# Patient Record
Sex: Male | Born: 1975
Health system: Southern US, Community
[De-identification: ages and names within clinical notes are randomized; demographics above are authoritative.]

## PROBLEM LIST (undated history)

## (undated) DIAGNOSIS — G459 Transient cerebral ischemic attack, unspecified: Secondary | ICD-10-CM

## (undated) DIAGNOSIS — I4891 Unspecified atrial fibrillation: Secondary | ICD-10-CM

## (undated) DIAGNOSIS — I10 Essential (primary) hypertension: Secondary | ICD-10-CM

## (undated) DIAGNOSIS — I509 Heart failure, unspecified: Secondary | ICD-10-CM

## (undated) HISTORY — DX: Heart failure, unspecified: I50.9

## (undated) HISTORY — DX: Transient cerebral ischemic attack, unspecified: G45.9

---

## 2007-07-29 ENCOUNTER — Emergency Department (HOSPITAL_COMMUNITY): Admission: EM | Admit: 2007-07-29 | Discharge: 2007-07-29 | Payer: Self-pay | Admitting: Emergency Medicine

## 2007-08-29 HISTORY — PX: CHOLECYSTECTOMY: SHX55

## 2007-09-17 ENCOUNTER — Emergency Department (HOSPITAL_COMMUNITY): Admission: EM | Admit: 2007-09-17 | Discharge: 2007-09-17 | Payer: Self-pay | Admitting: Emergency Medicine

## 2008-03-06 ENCOUNTER — Inpatient Hospital Stay (HOSPITAL_COMMUNITY): Admission: EM | Admit: 2008-03-06 | Discharge: 2008-03-07 | Payer: Self-pay | Admitting: Emergency Medicine

## 2008-03-06 ENCOUNTER — Encounter (INDEPENDENT_AMBULATORY_CARE_PROVIDER_SITE_OTHER): Payer: Self-pay | Admitting: General Surgery

## 2009-01-21 IMAGING — US US ABDOMEN COMPLETE
1 series · 13 of 25 positions shown · non-contrast
Comparison: 09/17/2007.

CLINICAL DATA: Abdominal pain.  History of gallstones.

ABDOMEN ULTRASOUND
TECHNIQUE: Complete abdominal ultrasound examination was performed
including evaluation of the liver, gallbladder, bile ducts,
pancreas, kidneys, spleen, IVC, and abdominal aorta.

[Series 1: unknown · 0.37mm/px · 13 of 64 slices shown]
[im 1/64]
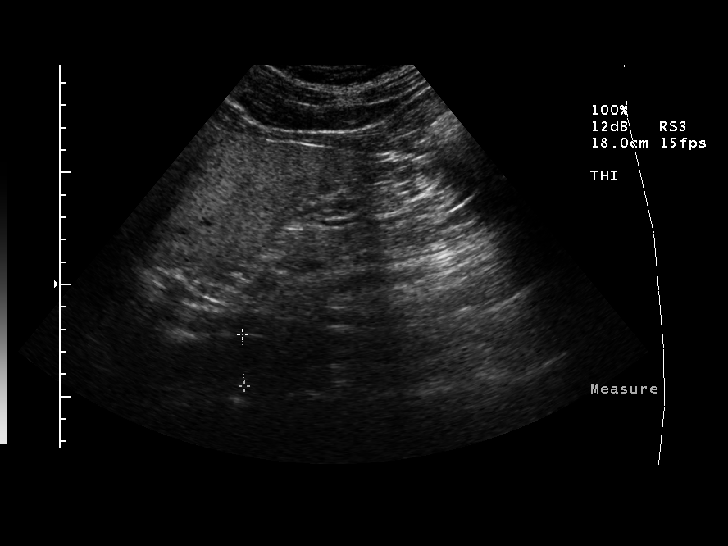
[im 6/64]
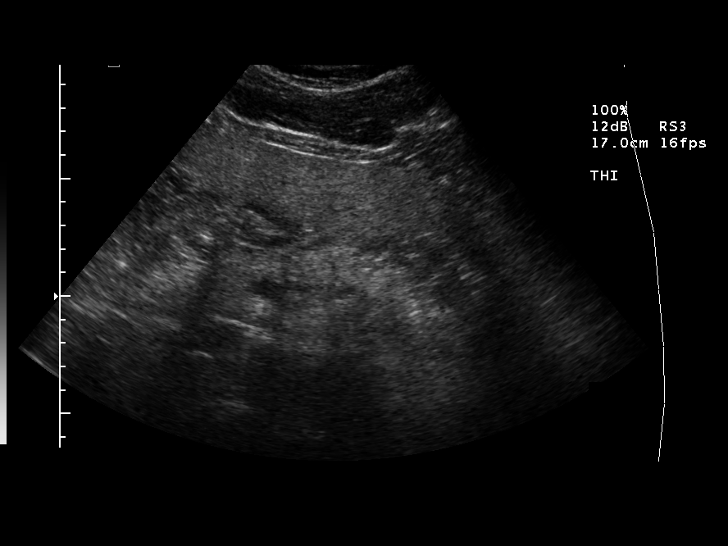
[im 11/64]
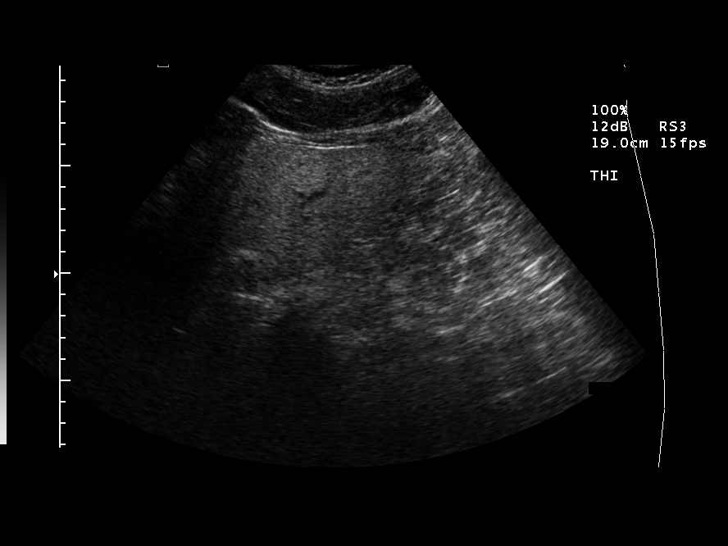
[im 16/64]
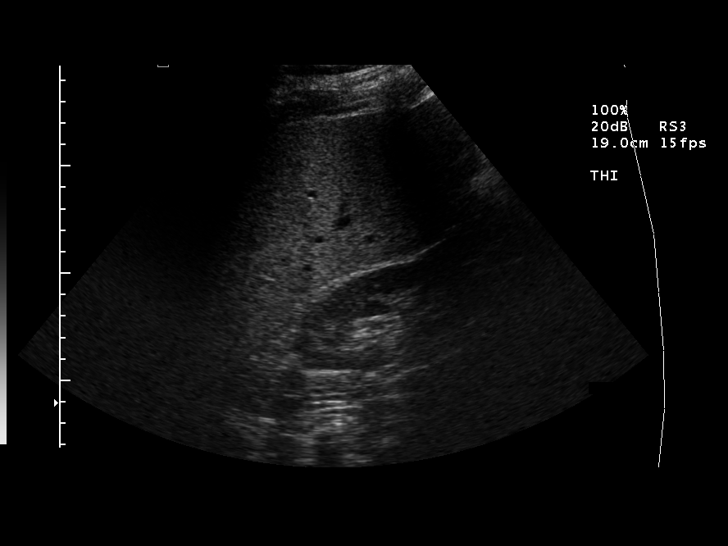
[im 22/64]
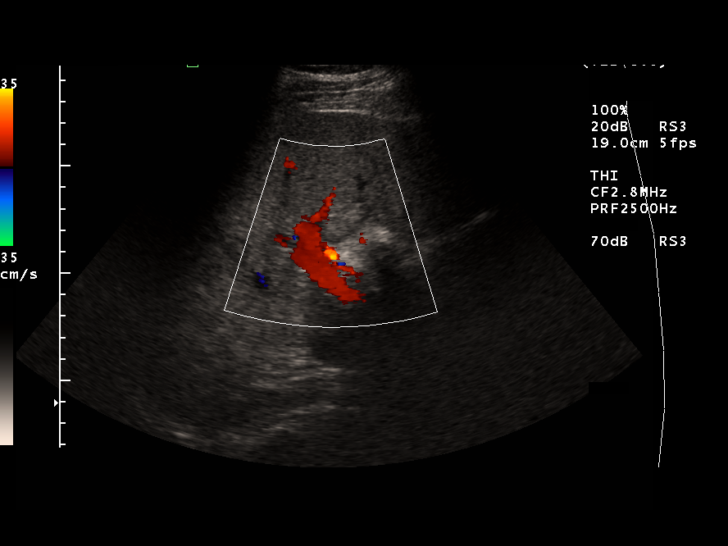
[im 27/64]
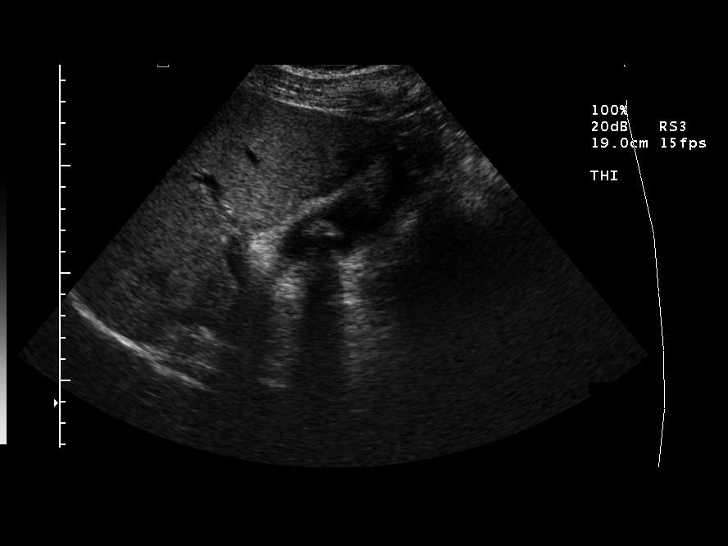
[im 32/64]
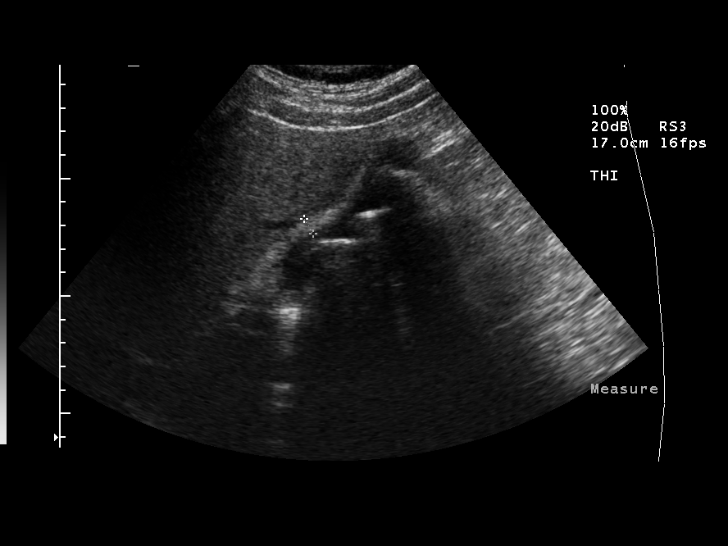
[im 37/64]
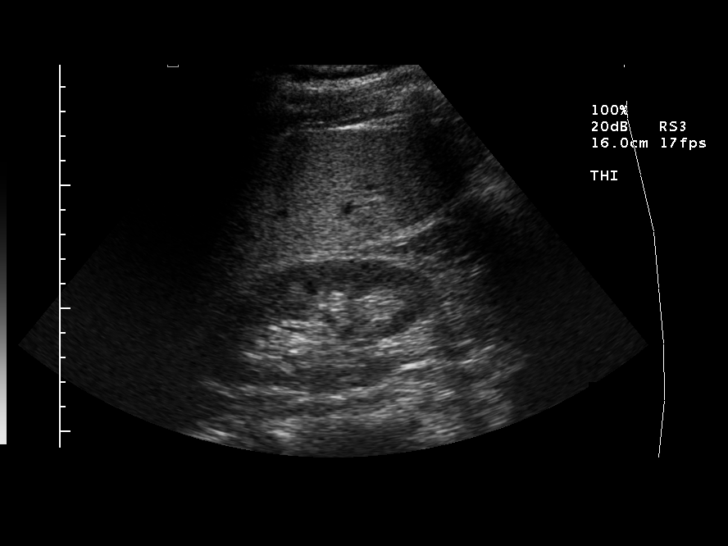
[im 43/64]
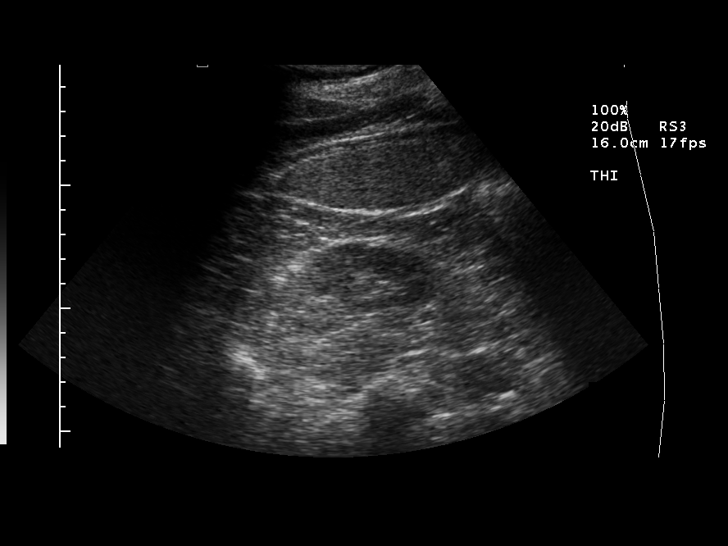
[im 48/64]
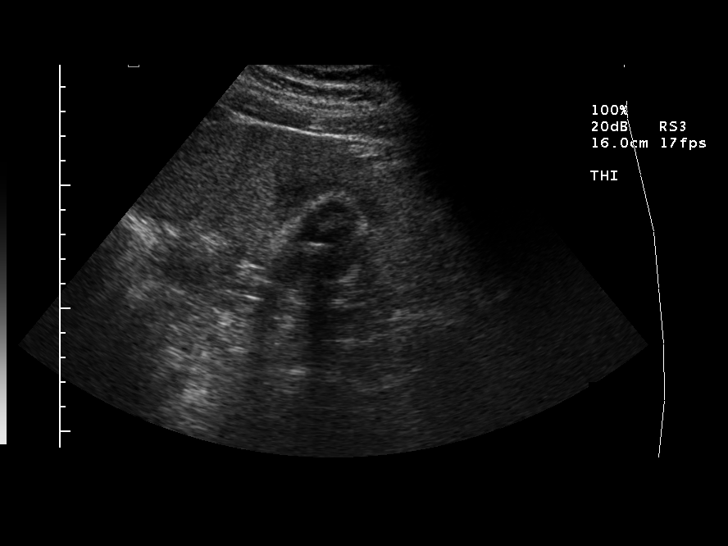
[im 53/64]
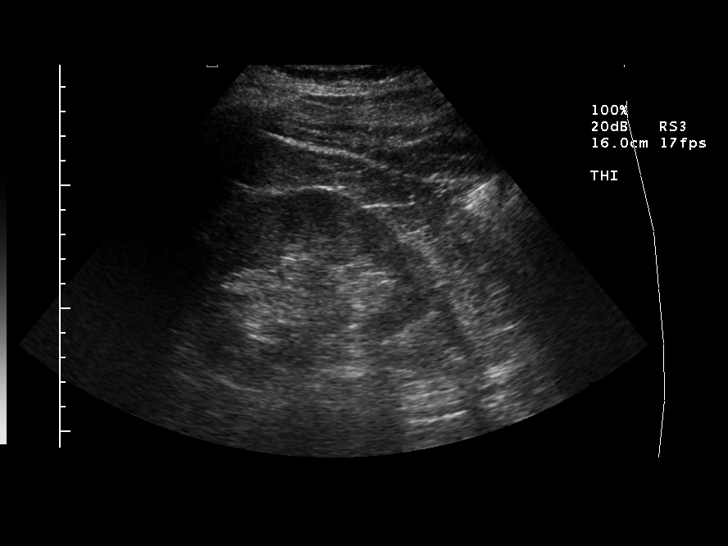
[im 58/64]
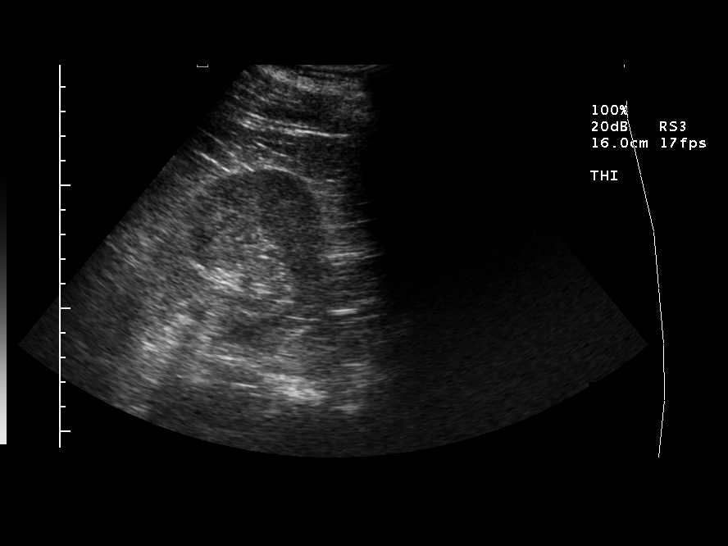
[im 64/64]
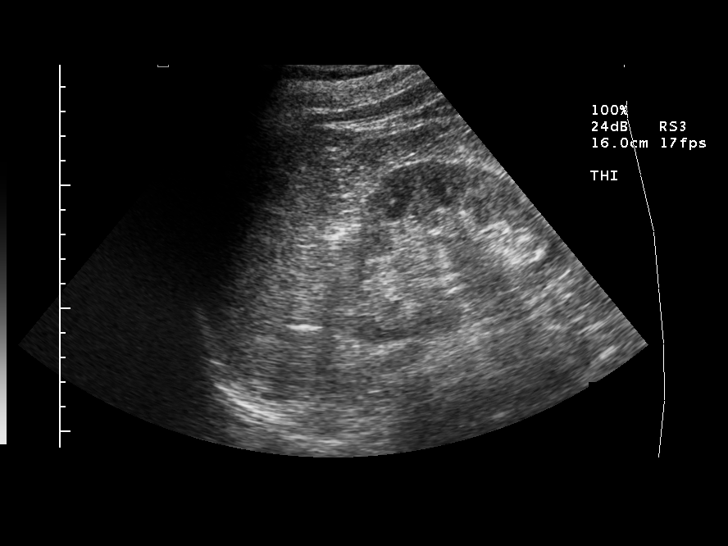

[13 of 25 positions shown; findings below may reference images not displayed]

FINDINGS: There is some loss of normal internal architecture of the
liver suggesting fatty infiltration.  No focal lesions are evident.
This is similar to the prior study.  Multiple gallstones are again
noted.  The gallbladder wall thickening is more apparent than on
the prior study.  Measures up to 7.4 mm.  There is no sonographic
Murphy's sign.  No free fluid is seen.  The common bile duct is at
the upper limits of normal measuring 6.1 mm.

The spleen is of normal size and echo texture measuring 10.9 cm.
The kidneys are unremarkable bilaterally.  There is no evidence for
stone or mass lesion.  There is no hydronephrosis.  The right
kidney measures 11.7 cm maximally.  The left kidney measures
cm maximally.  The aorta measures 2.3 cm maximally. The visualized
portions of the pancreas are unremarkable.
IMPRESSION: 1.  Cholelithiasis with mild gallbladder wall thickening.  Although
there is no sonographic Murphy's sign, this could represent
cholecystitis.  Clinical correlation and possibly a nuclear
medicine hepatic biliary scan may be use for further evaluation.
2.  Stable fatty infiltration of the liver.
3.  Borderline enlargement of the common bile duct at 6.1 mm.  This
is slightly increased.

## 2011-01-10 NOTE — Op Note (Signed)
NAMECAELLUM, MANCIL NO.:  [id number]   MEDICAL RECORD NO.:  [id number]          PATIENT TYPE:  INP   LOCATION:  5125                         FACILITY:  [facility]   PHYSICIAN:  Lennie Muckle, MD      DATE OF BIRTH:  Jul 27, 1976   DATE OF PROCEDURE:  03/06/2008  DATE OF DISCHARGE:                               OPERATIVE REPORT   PREOPERATIVE DIAGNOSES:  1. Cholecystitis.  2. Cholelithiasis.   POSTOPERATIVE DIAGNOSES:  1. Cholecystitis.  2. Cholelithiasis.   PROCEDURE:  Laparoscopic cholecystectomy with cholangiogram.   SURGEON:  Lennie Muckle, MD   ASSISTANT:  Letha Cape, PA   FINDINGS:  1. Thickened gallbladder wall consistent with cholecystitis.  2. Multiple stones within the gallbladder.   SPECIMEN:  Gallbladder to pathology.   ESTIMATED BLOOD LOSS:  50 mL.   COMPLICATIONS:  No immediate complications.   DRAINS:  No drains were placed.   INDICATIONS FOR PROCEDURE:  Mr. Stejskal is a 35 year old male who came  to the emergency department after having onset of pain approximately  3:00 a.m. He had previously been seen and was suppose to have an  elective cholecystectomy once he received insurance coverage.  However,  this acute onset was nonrelenting and thus he came to the emergency  department.   OPERATIVE FINDINGS:  The examination was consistent with acute  cholecystitis as well as cholelithiasis.   DETAILS OF PROCEDURE:  Mr. Hobbins was taken from the emergency  department to the preoperative holding suite.  He received 2 grams of  cefoxitin was taken to the operating room.  Once in the operating room,  he was placed in a supine position.  After administration of general  endotracheal anesthesia, his abdomen was prepped and draped in usual  sterile fashion.  A time-out procedure indicating the patient and  procedure were performed.  An incision was placed at the supraumbilical  area after anesthetizing the skin with 0.25% Marcaine.  A  Veress needle  was placed in the abdominal cavity to obtain pneumoperitoneum.  After  adequate pneumo-insufflation , I placed a #11 mm trocar with the OptiVu  inside the abdominal cavity.  All abdominal layers were visualized upon  entry.  The abdomen was inspected.  No evidence of injury upon placement  of trocar and Veress needle.  I then placed a 5 mm trocar at the  epigastric region and 1 in the right side of the abdomen and  visualization with camera.  A third 5-mm trocar was placed the right  side of the abdomen.  The gallbladder had omental adhesions.  The  infundibulum was difficult to grasp due to the thickened wall.  I did  aspirate some bilious fluid intake with grasping the fundus.  The fundus  was grasped up to the head of the patient.  I grasped the infundibulum  away from the liver bed.  Due to the very thickened wall, it was  somewhat difficult to dissect near the infundibulum.  Using dissection  with blunt as well as the [place] forceps, I was able to identify a  structure which  was deemed to be the cystic duct.  The cystic artery was  immediately posterior.  I continued carefully dissecting to ensure that  this was indeed the cystic duct.  I then placed a clip distally.  Transected across the cystic duct.  I was able to see the cholangiogram  catheter into the cystic duct for cholangiogram.  There was no evidence  of obstruction of the common duct and flow went easily into the  duodenum.  Right and left hepatic ducts were also visualized.  The  catheter was removed and I then placed 3 clips proximally on the cystic  duct.  Cystic artery was also clipped.  I ligated this distally with the  electrocautery.  I then continued my dissection on the peritoneum with  electrocautery.  This was again a difficult procedure due to very  thickened wall.  I did open the wall of the gallbladder and was able to  take it off successfully with electrocautery without spillage of stones.   The specimen was placed in EndoCatch bag and removed from the abdomen at  the umbilical region.  The abdomen was irrigated with 2 liters of  saline.  The liver bed was inspected.  The small amount of oozing which  was easily corrected with electrocautery.  There was no evidence of  bleeding upon final inspection of the liver bed and was inspected, no  evidence of bleeding.  I then closed the fascial defect at the umbilicus  with 0 Vicryl suture.  Final inspection again revealed no bleeding.  Pneumo-insufflation was released.  Trocars were removed.  Skin was  closed with 4-0 Monocryl.  The patient was then awoken and taken to Post  Anesthesia Care Unit stable condition.  She will be likely kept  overnight for pain control and discharged home in the morning.      Lennie Muckle, MD  Electronically Signed     ALA/MEDQ  D:  03/06/2008  T:  03/07/2008  Job:  [id number]

## 2011-01-10 NOTE — H&P (Signed)
NAMECOURVOISIER, Anthony Delgado NO.:  [id number]   MEDICAL RECORD NO.:  [id number]          PATIENT TYPE:  INP   LOCATION:  5125                         FACILITY:  [facility]   PHYSICIAN:  Lennie Muckle, MD      DATE OF BIRTH:  February 14, 1976   DATE OF ADMISSION:  03/06/2008  DATE OF DISCHARGE:                              HISTORY & PHYSICAL   ADMITTING PHYSICIAN:  Lennie Muckle, MD   CHIEF COMPLAINT:  Right upper quadrant pain.   HISTORY OF PRESENT ILLNESS:  Mr. Harman is a 32-year male patient  otherwise healthy.  He has had known cholelithiasis and was actually  evaluated by Dr. Derrell Lolling in 2008, but because of lack of insurance and  inability to pay for procedure he declined operative intervention at  that time.  This morning about 3 a.m., he was suddenly awakened because  of right upper quadrant pain with nausea.  This pain was unrelenting, so  his family presented to the ER and ultrasound was performed that  demonstrated cholelithiasis, gallbladder wall thickening, borderline  bile duct dilatation, and possible acute cholecystitis.  The patient has  had continued pain.  His AST is mildly elevated, otherwise his white  count is within normal limits.  General Surgery was asked to evaluate  the patient for possible admission for acute cholecystitis.   REVIEW OF SYSTEMS:  As per the history of present illness, otherwise all  categories of the review of systems are within normal limits or  noncontributory.   PAST MEDICAL HISTORY:  Known cholelithiasis.   PAST SURGICAL HISTORY:  None.   FAMILY HISTORY:  Noncontributory.   SOCIAL HISTORY:  No alcohol, no tobacco, no illegal drugs.   ALLERGIES:  SHELL FISH.   MEDICATIONS:  He is currently taking penicillin because of a dental  infection.   PHYSICAL EXAMINATION:  GENERAL:  Pleasant male patient, complaining of  continued right upper quadrant pain despite medications received in the  ER.  VITAL SIGNS:  Temperature 97.1,  BP 131/95, pulse 81, respirations 22.  PSYCH:  The patient is alert and oriented x3.  His affect is appropriate  to the current situation.  NEURO:  The patient's cranial nerves II-XII are grossly intact.  His  sensation is intact in the upper and lower extremities.  NECK:  Supple without appreciable adenopathy.  Thyroid down and  nonpalpable.  CHEST:  Bilateral lung sounds are clear to auscultation.  Respiratory  effort is nonlabored.  CARDIOVASCULAR:  Heart sounds are S1 and S2 without rubs, murmurs,  thrills, or gallops.  No JVD.  No peripheral edema.  Pulses are easily  palpable, 2+ radial and pedal.  ABDOMEN:  Soft.  Bowel sounds are present.  He is tender with guarding  in the right upper quadrant consistent with a Murphy sign.  No hernias,  no masses.  EXTREMITIES:  Symmetrical in appearance without flank cyanosis or  clubbing.   LABORATORY DATA:  White count 6300, neutrophils 51%, hemoglobin 16.3,  platelets 296,000.  Sodium is 140, potassium 4.2, CO2 25, glucose 97,  BUN 10, creatinine 1.27, total bilirubin 1.0,  alkaline phosphatase 100,  AST 45, ALT 37, and serum lipase is normal at 27.  Ultrasound of the  abdomen again reveals cholelithiasis, gallbladder wall thickening,  borderline common bile duct at 6.1 mm, and a question of pericholecystic  fluid consistent with acute cholecystitis.   IMPRESSION:  Biliary colic with cholelithiasis.   PLAN:  1. Admit the patient.  Plan operative intervention today, laparoscopic      cholecystectomy with intraoperative cholangiogram.  Risks and      benefits of this procedure have been discussed with the patient per      Dr. Freida Busman and the patient wishes to proceed.  2. Begin empiric antibiotic coverage and symptom management with      Phenergan, Zofran, and narcotic pain medications.  3. IV fluids and n.p.o. status until surgical intervention.      Anthony L. Kennith Center, MD  Electronically Signed     ALE/MEDQ  D:  03/06/2008  T:  03/07/2008  Job:  [id number]   cc:   Angelia Mould. Derrell Lolling, M.D.

## 2011-05-18 LAB — COMPREHENSIVE METABOLIC PANEL
ALT: 39
AST: 38 — ABNORMAL HIGH
Albumin: 3.9
BUN: 8
CO2: 27
Calcium: 9.2
Chloride: 99
Creatinine, Ser: 1.21
GFR calc Af Amer: >60
Potassium: 4.2

## 2011-05-18 LAB — DIFFERENTIAL
Basophils Relative: 1
Eosinophils Absolute: 0.1
Lymphs Abs: 2.3
Neutrophils Relative %: 65

## 2011-05-18 LAB — CBC
HCT: 47.9
MCHC: 33.4
MCV: 91
Platelets: 303
RBC: 5.26
RDW: 14.1
WBC: 9.3

## 2011-05-25 LAB — COMPREHENSIVE METABOLIC PANEL
ALT: 37
AST: 45 — ABNORMAL HIGH
Alkaline Phosphatase: 100
BUN: 10
CO2: 25
Calcium: 9.6
Chloride: 105
Creatinine, Ser: 1.27
GFR calc Af Amer: >60
GFR calc non Af Amer: >60
Glucose, Bld: 97
Potassium: 4.2
Total Protein: 8.5 — ABNORMAL HIGH

## 2011-05-25 LAB — DIFFERENTIAL
Basophils Absolute: 0
Basophils Relative: 1
Eosinophils Relative: 5
Lymphs Abs: 2.4
Monocytes Absolute: 0.3
Monocytes Relative: 6

## 2011-05-25 LAB — CBC
Hemoglobin: 16.3
MCV: 91.8
RDW: 14.1
WBC: 6.3

## 2011-06-05 LAB — URINALYSIS, ROUTINE W REFLEX MICROSCOPIC
Protein, ur: NEGATIVE
Specific Gravity, Urine: 1.023
Urobilinogen, UA: 0.2

## 2011-06-05 LAB — HEPATIC FUNCTION PANEL
ALT: 51
Alkaline Phosphatase: 62
Total Protein: 7.1

## 2011-06-05 LAB — POCT CARDIAC MARKERS
CKMB, poc: 1.4
Myoglobin, poc: 150

## 2011-06-05 LAB — I-STAT 8, (EC8 V) (CONVERTED LAB)
Acid-Base Excess: 1
BUN: 12
HCT: 51
Hemoglobin: 17.3 — ABNORMAL HIGH
Potassium: 4.4
Sodium: 139
pCO2, Ven: 43.2 — ABNORMAL LOW
pH, Ven: 7.4 — ABNORMAL HIGH

## 2011-06-05 LAB — DIFFERENTIAL
Basophils Absolute: 0
Basophils Relative: 0
Lymphocytes Relative: 44
Lymphs Abs: 2.5
Monocytes Relative: 7

## 2011-06-05 LAB — CBC
HCT: 45.3
MCHC: 34.1
MCV: 89.7
WBC: 5.6

## 2011-06-05 LAB — LIPASE, BLOOD: Lipase: 27

## 2011-06-05 LAB — POCT I-STAT CREATININE
Creatinine, Ser: 1.3
Operator id: 294511

## 2012-07-01 ENCOUNTER — Encounter (HOSPITAL_COMMUNITY): Payer: Self-pay | Admitting: *Deleted

## 2012-07-01 ENCOUNTER — Emergency Department (HOSPITAL_COMMUNITY)
Admission: EM | Admit: 2012-07-01 | Discharge: 2012-07-01 | Disposition: A | Discharge status: Home / Self Care | Payer: Self-pay | Attending: Emergency Medicine | Admitting: Emergency Medicine

## 2012-07-01 DIAGNOSIS — L03319 Cellulitis of trunk, unspecified: Secondary | ICD-10-CM | POA: Insufficient documentation

## 2012-07-01 DIAGNOSIS — L0291 Cutaneous abscess, unspecified: Secondary | ICD-10-CM

## 2012-07-01 DIAGNOSIS — L02219 Cutaneous abscess of trunk, unspecified: Secondary | ICD-10-CM | POA: Insufficient documentation

## 2012-07-01 LAB — CBC WITH DIFFERENTIAL/PLATELET
Basophils Absolute: 0 10*3/uL (ref 0.0–0.1)
Basophils Relative: 0 % (ref 0–1)
Eosinophils Relative: 3 % (ref 0–5)
HCT: 43.8 % (ref 39.0–52.0)
Neutro Abs: 3.8 10*3/uL (ref 1.7–7.7)
Platelets: 245 10*3/uL (ref 150–400)
RDW: 13.1 % (ref 11.5–15.5)
WBC: 6.7 10*3/uL (ref 4.0–10.5)

## 2012-07-01 LAB — POCT I-STAT, CHEM 8
Calcium, Ion: 1.17 mmol/L (ref 1.12–1.23)
Chloride: 106 mEq/L (ref 96–112)
Creatinine, Ser: 1.2 mg/dL (ref 0.50–1.35)
Glucose, Bld: 91 mg/dL (ref 70–99)
HCT: 48 % (ref 39.0–52.0)
Hemoglobin: 16.3 g/dL (ref 13.0–17.0)
Potassium: 3.6 mEq/L (ref 3.5–5.1)

## 2012-07-01 MED ORDER — OXYCODONE-ACETAMINOPHEN 5-325 MG PO TABS
2.0000 | ORAL_TABLET | Freq: Once | ORAL | Status: AC
  Administered 2012-07-01: 2 via ORAL
  Filled 2012-07-01: qty 1
  Filled 2012-07-01: qty 2

## 2012-07-01 MED ORDER — SULFAMETHOXAZOLE-TRIMETHOPRIM 800-160 MG PO TABS: 1.0000 | ORAL_TABLET | Freq: Two times a day (BID) | ORAL | Status: DC

## 2012-07-01 MED ORDER — ACETAMINOPHEN-CODEINE #3 300-30 MG PO TABS: 1.0000 | ORAL_TABLET | Freq: Four times a day (QID) | ORAL | Status: DC | PRN

## 2012-07-01 NOTE — ED Provider Notes (Addendum)
History     CSN: [id number]  Arrival date & time 07/01/12  1353   First MD Initiated Contact with Patient 07/01/12 1525      Chief Complaint  Patient presents with  . abdominal abscess     (Consider location/radiation/quality/duration/timing/severity/associated sxs/prior treatment) HPI Comments: 36 year old male presents to the emergency department with his wife with an abscess to left side of his abdomen that he first noticed on Friday. States it's gotten bigger over the past 3 days and has been draining a mixture of blood and pus. It is very tender. Denies fever, chills, nausea or vomiting. He is not sure whether or not he got bit by something.  The history is provided by the patient and the spouse.    History reviewed. No pertinent past medical history.  Past Surgical History  Procedure Date  . Cholecystectomy     No family history on file.  History  Substance Use Topics  . Smoking status: Never Smoker   . Smokeless tobacco: Not on file  . Alcohol Use: No      Review of Systems  Constitutional: Negative for fever and chills.  Respiratory: Negative for shortness of breath.   Cardiovascular: Negative for chest pain.  Gastrointestinal: Negative for nausea and vomiting.  Skin:       "abscess" to abdomen    Allergies  Review of patient's allergies indicates no known allergies.  Home Medications   Current Outpatient Rx  Name  Route  Sig  Dispense  Refill  . ASPIRIN 325 MG PO TABS   Oral   Take 325 mg by mouth daily as needed. For pain           BP 139/106  Pulse 97  Temp 98 F (36.7 C) (Oral)  Resp 18  SpO2 97%  Physical Exam  Constitutional: He is oriented to person, place, and time. He appears well-developed. No distress.       overweight  HENT:  Head: Normocephalic and atraumatic.  Mouth/Throat: Oropharynx is clear and moist.  Eyes: Conjunctivae normal and EOM are normal.  Neck: Normal range of motion. Neck supple.  Cardiovascular: Normal  rate, regular rhythm and normal heart sounds.   Pulmonary/Chest: Effort normal and breath sounds normal.  Abdominal: Soft. Bowel sounds are normal.  Musculoskeletal: Normal range of motion. He exhibits no edema.  Neurological: He is alert and oriented to person, place, and time.  Skin: Skin is warm and dry. He is not diaphoretic.     Psychiatric: He has a normal mood and affect. His behavior is normal.    ED Course  Procedures (including critical care time)  Labs Reviewed  CBC WITH DIFFERENTIAL - Abnormal; Notable for the following:    MCHC 36.1 (*)     All other components within normal limits  POCT I-STAT, CHEM 8  INCISION AND DRAINAGE Performed by: Johnnette Gourd Consent: Verbal consent obtained. Risks and benefits: risks, benefits and alternatives were discussed Type: abscess  Body area: lower left abdomen  Anesthesia: local infiltration  Local anesthetic: lidocaine 2% with epinephrine  Anesthetic total: 6 ml Scalpel used to incise abscess Complexity: complex Blunt dissection to break up loculations  Drainage: purulent  Drainage amount: medium  Packing material: none  Patient tolerance: Patient tolerated the procedure well with no immediate complications.    No results found.   1. Abscess and cellulitis       MDM  Abscess with surrounding cellulitis. Drained purulent material. No packing. Advised warm compresses to continue  to express pus. Rx bactrim and tylenol #3.       Trevor Mace, PA-C 07/01/12 1554  Trevor Mace, PA-C 07/17/12 2103

## 2012-07-01 NOTE — ED Notes (Signed)
Pt has a large abdominal abscess to LLQ with redness warmth and firmness over the last three days

## 2012-07-01 NOTE — ED Notes (Signed)
Pt with abscess LLQ, area reddened, states present for 2 days, no previous history, pt c/o pain at area

## 2012-07-02 NOTE — ED Provider Notes (Signed)
Medical screening examination/treatment/procedure(s) were performed by non-physician practitioner and as supervising physician I was immediately available for consultation/collaboration.   Raneem Mendolia E Keymoni Mccaster, MD 07/02/12 0728 

## 2012-07-03 ENCOUNTER — Emergency Department (HOSPITAL_COMMUNITY)
Admission: EM | Admit: 2012-07-03 | Discharge: 2012-07-04 | Disposition: A | Discharge status: Home / Self Care | Payer: Self-pay | Attending: Emergency Medicine | Admitting: Emergency Medicine

## 2012-07-03 ENCOUNTER — Encounter (HOSPITAL_COMMUNITY): Payer: Self-pay | Admitting: Adult Health

## 2012-07-03 ENCOUNTER — Emergency Department (HOSPITAL_COMMUNITY): Payer: Self-pay

## 2012-07-03 DIAGNOSIS — L0390 Cellulitis, unspecified: Secondary | ICD-10-CM | POA: Insufficient documentation

## 2012-07-03 DIAGNOSIS — L0291 Cutaneous abscess, unspecified: Secondary | ICD-10-CM | POA: Insufficient documentation

## 2012-07-03 LAB — URINALYSIS, ROUTINE W REFLEX MICROSCOPIC
Glucose, UA: NEGATIVE mg/dL
Ketones, ur: 15 mg/dL — AB
Nitrite: NEGATIVE
pH: 6.5 (ref 5.0–8.0)

## 2012-07-03 LAB — COMPREHENSIVE METABOLIC PANEL
ALT: 229 U/L — ABNORMAL HIGH (ref 0–53)
Alkaline Phosphatase: 96 U/L (ref 39–117)
GFR calc Af Amer: 72 mL/min — ABNORMAL LOW (ref >90)
Glucose, Bld: 101 mg/dL — ABNORMAL HIGH (ref 70–99)
Potassium: 3.3 mEq/L — ABNORMAL LOW (ref 3.5–5.1)
Sodium: 131 mEq/L — ABNORMAL LOW (ref 135–145)
Total Protein: 7.8 g/dL (ref 6.0–8.3)

## 2012-07-03 LAB — CBC WITH DIFFERENTIAL/PLATELET
Eosinophils Absolute: 0 10*3/uL (ref 0.0–0.7)
Lymphocytes Relative: 18 % (ref 12–46)
Lymphs Abs: 0.7 10*3/uL (ref 0.7–4.0)
MCH: 31.7 pg (ref 26.0–34.0)
Neutro Abs: 2.9 10*3/uL (ref 1.7–7.7)
Neutrophils Relative %: 76 % (ref 43–77)
Platelets: 218 10*3/uL (ref 150–400)
RBC: 4.64 MIL/uL (ref 4.22–5.81)
WBC: 3.8 10*3/uL — ABNORMAL LOW (ref 4.0–10.5)

## 2012-07-03 LAB — URINE MICROSCOPIC-ADD ON

## 2012-07-03 MED ORDER — SODIUM CHLORIDE 0.9 % IV BOLUS (SEPSIS)
1000.0000 mL | Freq: Once | INTRAVENOUS | Status: AC
  Administered 2012-07-03: 1000 mL via INTRAVENOUS

## 2012-07-03 MED ORDER — CLINDAMYCIN PHOSPHATE 300 MG/50ML IV SOLN
300.0000 mg | INTRAVENOUS | Status: AC
  Administered 2012-07-03: 300 mg via INTRAVENOUS
  Filled 2012-07-03: qty 50

## 2012-07-03 MED ORDER — ACETAMINOPHEN 325 MG PO TABS
650.0000 mg | ORAL_TABLET | Freq: Four times a day (QID) | ORAL | Status: DC | PRN
  Filled 2012-07-03 (×2): qty 2

## 2012-07-03 MED ORDER — CLINDAMYCIN PHOSPHATE 300 MG/50ML IV SOLN
300.0000 mg | Freq: Four times a day (QID) | INTRAVENOUS | Status: DC
  Administered 2012-07-04: 300 mg via INTRAVENOUS
  Filled 2012-07-03 (×4): qty 50

## 2012-07-03 MED ORDER — ACETAMINOPHEN 325 MG PO TABS
650.0000 mg | ORAL_TABLET | Freq: Once | ORAL | Status: DC
  Administered 2012-07-03: 650 mg via ORAL

## 2012-07-03 NOTE — ED Notes (Addendum)
Presents with fever of 101.2 body aches, nausea, diarrhea and vomiting that began yesterday associated with tightness to sternal chest that is worse with deep inspiration.  Had a I&D of abscess done Monday. Nickel sized open wound to left lower abdomen. bloody drainage noted. No redness around the site.  Pt is pale and diaphoretic. Alert, oriented.

## 2012-07-03 NOTE — ED Notes (Signed)
Pt has small abcess that was open 2 days ago to left abd.  Wound culture obtained by md and sent to lab.

## 2012-07-03 NOTE — ED Provider Notes (Signed)
History     CSN: [id number]  Arrival date & time 07/03/12  1958   First MD Initiated Contact with Patient 07/03/12 2152      Chief Complaint  Patient presents with  . Fever     HPI Had I&D of abdominal wall abscess last night.  Taking abx., but fever and redness seem to be worsening. History reviewed. No pertinent past medical history.  Past Surgical History  Procedure Date  . Cholecystectomy     History reviewed. No pertinent family history.  History  Substance Use Topics  . Smoking status: Never Smoker   . Smokeless tobacco: Not on file  . Alcohol Use: No      Review of Systems  All other systems reviewed and are negative.    Allergies  Review of patient's allergies indicates no known allergies.  Home Medications   Current Outpatient Rx  Name  Route  Sig  Dispense  Refill  . ACETAMINOPHEN-CODEINE #3 300-30 MG PO TABS   Oral   Take 1-2 tablets by mouth every 6 (six) hours as needed. For pain         . ASPIRIN 325 MG PO TABS   Oral   Take 325 mg by mouth daily as needed. For pain         . SULFAMETHOXAZOLE-TRIMETHOPRIM 800-160 MG PO TABS   Oral   Take 1 tablet by mouth 2 (two) times daily. For 7 days; Start date 07/01/12         . SULFAMETHOXAZOLE-TRIMETHOPRIM 800-160 MG PO TABS   Oral   Take 1 tablet by mouth every 12 (twelve) hours.   20 tablet   0     BP 131/88  Pulse 92  Temp 98.3 F (36.8 C) (Oral)  Resp 20  Ht 6' (1.829 m)  Wt 262 lb (118.842 kg)  BMI 35.53 kg/m2  SpO2 97%  Physical Exam  Constitutional: He is oriented to person, place, and time. He appears well-developed. No distress.       overweight  HENT:  Head: Normocephalic and atraumatic.  Mouth/Throat: Oropharynx is clear and moist.  Eyes: Conjunctivae normal and EOM are normal.  Neck: Normal range of motion. Neck supple.  Cardiovascular: Normal rate, regular rhythm and normal heart sounds.   Pulmonary/Chest: Effort normal and breath sounds normal.  Abdominal:  Soft. Bowel sounds are normal. There is tenderness (see illustration).    Musculoskeletal: Normal range of motion. He exhibits no edema.  Neurological: He is alert and oriented to person, place, and time.  Skin: Skin is warm and dry. He is not diaphoretic.     Psychiatric: He has a normal mood and affect. His behavior is normal.    ED Course  Procedures (including critical care time)  Labs Reviewed  CBC WITH DIFFERENTIAL - Abnormal; Notable for the following:    WBC 3.8 (*)     MCHC 36.4 (*)     All other components within normal limits  COMPREHENSIVE METABOLIC PANEL - Abnormal; Notable for the following:    Sodium 131 (*)     Potassium 3.3 (*)     Glucose, Bld 101 (*)     Creatinine, Ser 1.41 (*)     Albumin 3.3 (*)     AST 204 (*)     ALT 229 (*)     GFR calc non Af Amer 62 (*)     GFR calc Af Amer 72 (*)     All other components within normal limits  URINALYSIS, ROUTINE W REFLEX MICROSCOPIC - Abnormal; Notable for the following:    Color, Urine AMBER (*)  BIOCHEMICALS MAY BE AFFECTED BY COLOR   APPearance CLOUDY (*)     Bilirubin Urine SMALL (*)     Ketones, ur 15 (*)     Protein, ur 30 (*)     Urobilinogen, UA 2.0 (*)     Leukocytes, UA TRACE (*)     All other components within normal limits  WOUND CULTURE  URINE MICROSCOPIC-ADD ON  URINE CULTURE   Dg Chest 2 View  07/03/2012  *RADIOLOGY REPORT*  Clinical Data: Chest pain and shortness of breath; fever  CHEST - 2 VIEW  Comparison: None.  Findings: There is minimal scarring in the left lower lobe.  The lungs are otherwise clear.  Heart size and pulmonary vascularity are normal.  No adenopathy.  No bone lesions.  IMPRESSION: No edema or consolidation.   Original Report Authenticated By: Bretta Bang, M.D.    Ct Abdomen Pelvis W Contrast  07/04/2012  *RADIOLOGY REPORT*  Clinical Data: Abdominal pain, body aches, nausea, diarrhea and vomiting.  Fever.  Recent I&D of abdominal wall abscess.  Assess for residual  abscess.  CT ABDOMEN AND PELVIS WITH CONTRAST  Technique:  Multidetector CT imaging of the abdomen and pelvis was performed following the standard protocol during bolus administration of intravenous contrast.  Contrast: OMNIPAQUE IOHEXOL 300 MG/ML  SOLN  Comparison: Abdominal ultrasound performed 03/06/2008  Findings: Minimal bibasilar atelectasis is noted.  At the left mid abdomen, there is a small focal soft tissue wound, with associated skin thickening and underlying soft tissue inflammation.  No associated fluid collection is identified; this likely reflects the recently drained abdominal wall abscess.  There is no evidence of residual or recurrent abscess at this location. Soft tissue inflammation remains relatively superficial, extending approximately 2.3 cm deep to the skin surface.  The liver and spleen are unremarkable in appearance.  The patient is status post cholecystectomy, with clips noted at the gallbladder fossa.  The pancreas and adrenal glands are unremarkable.  A 5 mm stone is noted at the interpole region of the left kidney; a 9 mm hypodensity near the lower pole of the right kidney likely reflects a cyst.  There is no evidence of hydronephrosis.  No obstructing ureteral stones are seen.  No perinephric stranding is appreciated.  No free fluid is identified.  The small bowel is unremarkable in appearance.  The stomach is within normal limits.  No acute vascular abnormalities are seen.  The appendix is normal in caliber, without evidence for appendicitis.  The colon is unremarkable in appearance.  The bladder is mildly distended and grossly unremarkable in appearance.  The prostate is normal in size.  A small left inguinal hernia is noted, containing only fat.  No inguinal lymphadenopathy is seen.  No acute osseous abnormalities are identified.  IMPRESSION:  1.  No evidence of residual or recurrent abscess; small focal soft tissue wound at the left mid abdomen demonstrates skin thickening  and underlying soft tissue inflammation.  It remains relatively superficial, extending up to 2.3 cm deep to the skin surface. 2.  5 mm stone at the interpole region of the left kidney; small right renal cyst seen. 3.  Small left inguinal hernia, containing only fat.   Original Report Authenticated By: Tonia Ghent, M.D.      1. Cellulitis       MDM          Molly Maduro  Judye Bos, MD 07/04/12 1047

## 2012-07-04 ENCOUNTER — Emergency Department (HOSPITAL_COMMUNITY): Payer: Self-pay

## 2012-07-04 ENCOUNTER — Encounter (HOSPITAL_COMMUNITY): Payer: Self-pay | Admitting: Radiology

## 2012-07-04 LAB — URINE CULTURE
Colony Count: NO GROWTH
Culture: NO GROWTH

## 2012-07-04 MED ORDER — IOHEXOL 300 MG/ML  SOLN
100.0000 mL | Freq: Once | INTRAMUSCULAR | Status: AC | PRN
  Administered 2012-07-04: 100 mL via INTRAVENOUS

## 2012-07-04 MED ORDER — SULFAMETHOXAZOLE-TRIMETHOPRIM 800-160 MG PO TABS: 1.0000 | ORAL_TABLET | Freq: Two times a day (BID) | ORAL | Status: DC

## 2012-07-04 MED ORDER — IBUPROFEN 800 MG PO TABS
800.0000 mg | ORAL_TABLET | Freq: Once | ORAL | Status: AC
Start: 2012-07-04 — End: 2012-07-04
  Administered 2012-07-04: 800 mg via ORAL
  Filled 2012-07-04: qty 1

## 2012-07-04 NOTE — ED Provider Notes (Signed)
9:36 AM Patient with a hx sig for fever was placed in CDU on cellulitis protocol. Patient care resumed from Dr. Effie Shy.  Patient is here for cellulitis and has received IV Clindamycin. While in obeservation over night the pt slept well and had no complaints, per nursing staff. Patient re-evaluated and is resting comfortable, VSS, with no new complaints or concerns at this time. Plan per previous provider is to discharge home with PO antibiotics since CT abdomen negative. On exam: hemodynamically stable, NAD, heart w/ RRR, lungs CTAB, Chest & abd tender to palpation at left mid abdomen, no peripheral edema or calf tenderness.   Patient will be discharged with PO Clindamycin and instructed to return with worsening or concerning symptoms.   Anthony Beck, PA-C 07/04/12 1122

## 2012-07-06 ENCOUNTER — Encounter (HOSPITAL_COMMUNITY): Payer: Self-pay | Admitting: Internal Medicine

## 2012-07-06 ENCOUNTER — Inpatient Hospital Stay (HOSPITAL_COMMUNITY)
Admission: EM | Admit: 2012-07-06 | Discharge: 2012-07-09 | DRG: 871 | Disposition: A | Discharge status: Home / Self Care | Payer: MEDICAID | Attending: Internal Medicine | Admitting: Internal Medicine

## 2012-07-06 ENCOUNTER — Inpatient Hospital Stay (HOSPITAL_COMMUNITY): Payer: Self-pay

## 2012-07-06 DIAGNOSIS — E871 Hypo-osmolality and hyponatremia: Secondary | ICD-10-CM | POA: Diagnosis present

## 2012-07-06 DIAGNOSIS — Z9089 Acquired absence of other organs: Secondary | ICD-10-CM

## 2012-07-06 DIAGNOSIS — R945 Abnormal results of liver function studies: Secondary | ICD-10-CM | POA: Diagnosis present

## 2012-07-06 DIAGNOSIS — Z792 Long term (current) use of antibiotics: Secondary | ICD-10-CM

## 2012-07-06 DIAGNOSIS — Z79899 Other long term (current) drug therapy: Secondary | ICD-10-CM

## 2012-07-06 DIAGNOSIS — R7989 Other specified abnormal findings of blood chemistry: Secondary | ICD-10-CM

## 2012-07-06 DIAGNOSIS — T368X5A Adverse effect of other systemic antibiotics, initial encounter: Secondary | ICD-10-CM | POA: Diagnosis present

## 2012-07-06 DIAGNOSIS — L02219 Cutaneous abscess of trunk, unspecified: Secondary | ICD-10-CM | POA: Diagnosis present

## 2012-07-06 DIAGNOSIS — A419 Sepsis, unspecified organism: Secondary | ICD-10-CM | POA: Diagnosis present

## 2012-07-06 DIAGNOSIS — K5289 Other specified noninfective gastroenteritis and colitis: Secondary | ICD-10-CM | POA: Diagnosis present

## 2012-07-06 DIAGNOSIS — R531 Weakness: Secondary | ICD-10-CM

## 2012-07-06 DIAGNOSIS — A4901 Methicillin susceptible Staphylococcus aureus infection, unspecified site: Secondary | ICD-10-CM

## 2012-07-06 DIAGNOSIS — Y92009 Unspecified place in unspecified non-institutional (private) residence as the place of occurrence of the external cause: Secondary | ICD-10-CM

## 2012-07-06 DIAGNOSIS — G8220 Paraplegia, unspecified: Secondary | ICD-10-CM | POA: Diagnosis present

## 2012-07-06 DIAGNOSIS — R509 Fever, unspecified: Secondary | ICD-10-CM

## 2012-07-06 DIAGNOSIS — Z7982 Long term (current) use of aspirin: Secondary | ICD-10-CM

## 2012-07-06 DIAGNOSIS — R197 Diarrhea, unspecified: Secondary | ICD-10-CM

## 2012-07-06 DIAGNOSIS — G929 Unspecified toxic encephalopathy: Secondary | ICD-10-CM | POA: Diagnosis present

## 2012-07-06 DIAGNOSIS — L0390 Cellulitis, unspecified: Secondary | ICD-10-CM

## 2012-07-06 DIAGNOSIS — R112 Nausea with vomiting, unspecified: Secondary | ICD-10-CM | POA: Diagnosis present

## 2012-07-06 DIAGNOSIS — L03319 Cellulitis of trunk, unspecified: Secondary | ICD-10-CM | POA: Diagnosis present

## 2012-07-06 DIAGNOSIS — G9340 Encephalopathy, unspecified: Secondary | ICD-10-CM | POA: Diagnosis present

## 2012-07-06 DIAGNOSIS — G92 Toxic encephalopathy: Secondary | ICD-10-CM | POA: Diagnosis present

## 2012-07-06 DIAGNOSIS — A4101 Sepsis due to Methicillin susceptible Staphylococcus aureus: Principal | ICD-10-CM | POA: Diagnosis present

## 2012-07-06 HISTORY — DX: Methicillin susceptible Staphylococcus aureus infection, unspecified site: A49.01

## 2012-07-06 HISTORY — DX: Other specified abnormal findings of blood chemistry: R79.89

## 2012-07-06 HISTORY — DX: Cutaneous abscess of trunk, unspecified: L02.219

## 2012-07-06 HISTORY — DX: Hypo-osmolality and hyponatremia: E87.1

## 2012-07-06 HISTORY — DX: Nausea with vomiting, unspecified: R11.2

## 2012-07-06 HISTORY — DX: Encephalopathy, unspecified: G93.40

## 2012-07-06 HISTORY — DX: Diarrhea, unspecified: R19.7

## 2012-07-06 LAB — GLUCOSE, CAPILLARY: Glucose-Capillary: 109 mg/dL — ABNORMAL HIGH (ref 70–99)

## 2012-07-06 LAB — CBC WITH DIFFERENTIAL/PLATELET
Basophils Absolute: 0 10*3/uL (ref 0.0–0.1)
Eosinophils Absolute: 0.1 10*3/uL (ref 0.0–0.7)
Eosinophils Relative: 1 % (ref 0–5)
Lymphs Abs: 0.8 10*3/uL (ref 0.7–4.0)
MCH: 30.8 pg (ref 26.0–34.0)
MCV: 87 fL (ref 78.0–100.0)
Monocytes Absolute: 0.8 10*3/uL (ref 0.1–1.0)
Platelets: 246 10*3/uL (ref 150–400)
RDW: 13.3 % (ref 11.5–15.5)

## 2012-07-06 LAB — COMPREHENSIVE METABOLIC PANEL
ALT: 278 U/L — ABNORMAL HIGH (ref 0–53)
Calcium: 8.5 mg/dL (ref 8.4–10.5)
Creatinine, Ser: 1.08 mg/dL (ref 0.50–1.35)
GFR calc Af Amer: >90 mL/min (ref >90)
Glucose, Bld: 110 mg/dL — ABNORMAL HIGH (ref 70–99)
Sodium: 130 mEq/L — ABNORMAL LOW (ref 135–145)
Total Protein: 7.2 g/dL (ref 6.0–8.3)

## 2012-07-06 LAB — LACTIC ACID, PLASMA: Lactic Acid, Venous: 0.9 mmol/L (ref 0.5–2.2)

## 2012-07-06 LAB — WOUND CULTURE

## 2012-07-06 MED ORDER — GADOBENATE DIMEGLUMINE 529 MG/ML IV SOLN: 20.0000 mL | Freq: Once | INTRAVENOUS | Status: AC | PRN

## 2012-07-06 MED ORDER — MORPHINE SULFATE 2 MG/ML IJ SOLN
2.0000 mg | INTRAMUSCULAR | Status: DC | PRN
  Administered 2012-07-07 (×2): 2 mg via INTRAVENOUS
  Filled 2012-07-06 (×2): qty 1

## 2012-07-06 MED ORDER — ACETAMINOPHEN 650 MG RE SUPP: 650.0000 mg | Freq: Four times a day (QID) | RECTAL | Status: DC | PRN

## 2012-07-06 MED ORDER — ALUM & MAG HYDROXIDE-SIMETH 200-200-20 MG/5ML PO SUSP: 30.0000 mL | Freq: Four times a day (QID) | ORAL | Status: DC | PRN

## 2012-07-06 MED ORDER — SODIUM CHLORIDE 0.9 % IV BOLUS (SEPSIS)
500.0000 mL | Freq: Once | INTRAVENOUS | Status: AC
  Administered 2012-07-06: 19:00:00 via INTRAVENOUS

## 2012-07-06 MED ORDER — SODIUM CHLORIDE 0.9 % IV SOLN
INTRAVENOUS | Status: DC
  Administered 2012-07-06 – 2012-07-08 (×6): via INTRAVENOUS

## 2012-07-06 MED ORDER — ONDANSETRON HCL 4 MG/2ML IJ SOLN
4.0000 mg | Freq: Once | INTRAMUSCULAR | Status: AC
  Administered 2012-07-06: 4 mg via INTRAVENOUS
  Filled 2012-07-06: qty 2

## 2012-07-06 MED ORDER — ONDANSETRON HCL 4 MG/2ML IJ SOLN
4.0000 mg | Freq: Four times a day (QID) | INTRAMUSCULAR | Status: DC | PRN
  Administered 2012-07-07 – 2012-07-08 (×3): 4 mg via INTRAVENOUS
  Filled 2012-07-06 (×3): qty 2

## 2012-07-06 MED ORDER — SODIUM CHLORIDE 0.9 % IV BOLUS (SEPSIS)
1000.0000 mL | Freq: Once | INTRAVENOUS | Status: AC
  Administered 2012-07-06: 1000 mL via INTRAVENOUS

## 2012-07-06 MED ORDER — SODIUM CHLORIDE 0.9 % IV SOLN: INTRAVENOUS | Status: DC

## 2012-07-06 MED ORDER — CEFAZOLIN SODIUM 1-5 GM-% IV SOLN
1.0000 g | Freq: Three times a day (TID) | INTRAVENOUS | Status: DC
  Administered 2012-07-06 – 2012-07-09 (×9): 1 g via INTRAVENOUS
  Filled 2012-07-06 (×11): qty 50

## 2012-07-06 MED ORDER — ONDANSETRON HCL 4 MG/2ML IJ SOLN: 4.0000 mg | Freq: Three times a day (TID) | INTRAMUSCULAR | Status: AC | PRN

## 2012-07-06 MED ORDER — ACETAMINOPHEN 325 MG PO TABS
650.0000 mg | ORAL_TABLET | Freq: Once | ORAL | Status: AC
  Administered 2012-07-06: 650 mg via ORAL
  Filled 2012-07-06: qty 1

## 2012-07-06 MED ORDER — ACETAMINOPHEN 325 MG PO TABS: 650.0000 mg | ORAL_TABLET | Freq: Four times a day (QID) | ORAL | Status: DC | PRN

## 2012-07-06 MED ORDER — OXYCODONE HCL 5 MG PO TABS
5.0000 mg | ORAL_TABLET | ORAL | Status: DC | PRN
  Administered 2012-07-08: 5 mg via ORAL
  Filled 2012-07-06: qty 1

## 2012-07-06 MED ORDER — ONDANSETRON HCL 4 MG PO TABS
4.0000 mg | ORAL_TABLET | Freq: Four times a day (QID) | ORAL | Status: DC | PRN
  Administered 2012-07-08: 4 mg via ORAL
  Filled 2012-07-06: qty 1

## 2012-07-06 NOTE — ED Notes (Signed)
Patient complaining of chest pain.  ekg done.  Given to dr. Lynelle Doctor.

## 2012-07-06 NOTE — ED Provider Notes (Signed)
36 year old male was in the emergency room 5 days ago for incision and drainage of an abdominal wall abscess and started on Bactrim. He returned 2 days later was noted to have an extensive area of cellulitis and was kept in CDU under cellulitis protocol. CT did not show any ongoing pus collection. He was switched to clindamycin. Since discharge, he has had persistent vomiting and has not been able to hold an antibiotic. Your common is noted to be slightly lethargic and somewhat tachycardic with heart rate of 105. The incision site appears reasonably clean there is only minimal erythema the skin and no induration. Laboratory workup is significant for elevated transaminases and alkaline phosphatase. Elevated transaminases were present but when checked 3 days ago but not when checked in 2009. He is also noted to be hypoalbuminemic with the albumin of 2.8 and hyponatremic with sodium of 1:30. He'll need to be admitted as an outpatient treatment failure and also the investigation for cause of his elevated liver enzymes and low albumin and low sodium.  ECG shows normal sinus rhythm with a rate of 89, occasional PAC. Normal axis. Normal P wave. Normal QRS. Normal intervals. Normal ST and T waves. Impression: normal ECG.  Medical screening examination/treatment/procedure(s) were conducted as a shared visit with non-physician practitioner(s) and myself.  I personally evaluated the patient during the encounter   Dione Booze, MD 07/06/12 [phone]

## 2012-07-06 NOTE — ED Notes (Signed)
Patient to MRI.

## 2012-07-06 NOTE — H&P (Signed)
[facility] History and Physical  Anthony Delgado ZOX:[id number] DOB: 1976/06/19 DOA: 07/06/2012  Referring physician: EDP PCP: does not have a PCP   Chief Complaint:  Chief Complaint  Patient presents with  . Fever     HPI: Anthony Delgado is a 36 y.o. male with out any significant past medical history who presented to the emergency room today for fever. This is the third presentation to the emergency room in the past 5 days. He presented initially on November 4 with a left abdominal wall abscess. At that time he had no systemic symptoms. When he presented the abscess was already in place for 72 hours. He underwent incision and drainage was placed on bactrim and discharged home. Patient presented back to the emergency room on November 7 with complaints of redness at the abdominal incision site. He also was complaining of abdominal pain. By then the patient also had temperature of up to 101.2. A CT scan of the abdomen was obtained on November 7 without any acute abdominal pathology identified. The patient was kept in CDU was given IV clindamycin. He was switched to oral clindamycin and discharged home. His wife brought him back today, November 9 after he has been having nausea vomiting and diarrhea. She has also noticed that he is now lethargic and extremely weak. 3 people had to carry him from the car to the stretcher in the emergency room. The patient is somnolent but is able to stay awake. He only reports back pain. According to the wife he does have chronic back pain from working at [employer].    Review of Systems:  Patient has occasional chest pains and back pains which were felt to be musculoskeletal in the past due to heavy lifting The patient denies vision loss, decreased hearing, hoarseness,  dyspnea on exertion, peripheral edema, hemoptysis,  melena, hematochezia,  hematuria, incontinence, genital sores, muscle weakness, transient blindness,  depression, unusual weight  change,    the patient has no past medical history beyond chronic back pain   Past Surgical History  Procedure Date  . Cholecystectomy 2009   Social History:  reports that he has never smoked. He does not have any smokeless tobacco history on file. He reports that he does not drink alcohol or use illicit drugs. 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 Cancer Mother      Prior to Admission medications   Medication Sig Start Date End Date Taking? Authorizing Provider  acetaminophen-codeine (TYLENOL #3) 300-30 MG per tablet Take 1-2 tablets by mouth every 6 (six) hours as needed. For pain 07/01/12  Yes Trevor Mace, PA-C  aspirin 325 MG tablet Take 325 mg by mouth daily as needed. For pain   Yes Historical Provider, MD  sulfamethoxazole-trimethoprim (BACTRIM DS,SEPTRA DS) 800-160 MG per tablet Take 1 tablet by mouth 2 (two) times daily. For 7 days; Start date 07/01/12 07/01/12  Yes Trevor Mace, PA-C   Physical Exam: Filed Vitals:   07/06/12 1448 07/06/12 1457 07/06/12 1506  BP: 145/96  130/83  Pulse: 105  94  Temp: 99.3 F (37.4 C)  100.4 F (38 C)  TempSrc: Oral  Oral  Resp: 20  17  SpO2: 95% 94% 97%     General:  Lethargic, arousable, whispering back answers. Oriented to place person and time. Wife is providing the history.  Eyes: Pupils are 3 mm symmetric  and reactive to light  ENT: Dry oral mucosa  Neck: No JVD no thyromegaly  Cardiovascular: Regular rate and rhythm, 2/6 systolic murmur at the apex  Respiratory: Clear to auscultation bilaterally without wheeze rhonchi crackles  Abdomen: Inspection of the abdomen reveals post laparoscopic cholecystectomy scars, 2 cm area of induration in the left lateral abdominal wall at the site of the prior abscess, without significant surrounding edema. Bowel sounds are diminished, abdomen is nontender  Skin: No suspicious  rashes  Musculoskeletal: Intact muscle bulk and tone  Psychiatric: Difficult to test as patient is lethargic  Neurologic: Cranial nerves 2-12 intact, strength is 5 out of 5 both upper extremities bilaterally, sensation is intact, deep tendon reflexes of the lower extremities are 0/2, the patient is able to move both lower extremities only at the same plan but not against gravity, sensation is intact in the lower extremity is bilaterally  Labs on Admission:  Basic Metabolic Panel:  Lab 07/06/12 1610 07/03/12 1942 07/01/12 1501  NA 130* 131* 142  K 3.5 3.3* 3.6  CL 98 97 106  CO2 21 22 --  GLUCOSE 110* 101* 91  BUN 14 17 11   CREATININE 1.08 1.41* 1.20  CALCIUM 8.5 8.7 --  MG -- -- --  PHOS -- -- --   Liver Function Tests:  Lab 07/06/12 1531 07/03/12 1942  AST 205* 204*  ALT 278* 229*  ALKPHOS 147* 96  BILITOT 0.7 0.4  PROT 7.2 7.8  ALBUMIN 2.8* 3.3*   No results found for this basename: LIPASE:5,AMYLASE:5 in the last 168 hours No results found for this basename: AMMONIA:5 in the last 168 hours CBC:  Lab 07/06/12 1531 07/03/12 1942 07/01/12 1501 07/01/12 1417  WBC 5.7 3.8* -- 6.7  NEUTROABS 4.1 2.9 -- 3.8  HGB 14.5 14.7 16.3 15.8  HCT 41.0 40.4 48.0 43.8  MCV 87.0 87.1 -- 88.1  PLT 246 218 -- 245   Cardiac Enzymes: No results found for this basename: CKTOTAL:5,CKMB:5,CKMBINDEX:5,TROPONINI:5 in the last 168 hours  BNP (last 3 results) No results found for this basename: PROBNP:3 in the last 8760 hours CBG:  Lab 07/06/12 1448  GLUCAP 109*    Radiological Exams on Admission: No results found.  WOUND LEFT ABDOMEN   Special Requests:    Normal   Gram Stain:    FEW WBC PRESENT, PREDOMINANTLY PMN NO SQUAMOUS EPITHELIAL CELLS SEEN MODERATE GRAM POSITIVE COCCI IN PAIRS IN CLUSTERS IN CHAINS   Culture:    ABUNDANT STAPHYLOCOCCUS AUREUS Note: RIFAMPIN AND GENTAMICIN SHOULD NOT BE USED AS SINGLE DRUGS FOR TREATMENT OF STAPH INFECTIONS. This organism DOES NOT  demonstrate inducible Clindamycin resistance in vitro.   Report Status:    07/06/2012 FINAL   Organism ID, Bacteria:    STAPHYLOCOCCUS AUREUS      Culture & Susceptibility     STAPHYLOCOCCUS AUREUS        Antibiotic  Sensitivity  Microscan  Status     CLINDAMYCIN  Sensitive  <=0.25  Final     Method:  MIC     ERYTHROMYCIN  Resistant  >=8  Final     Method:  MIC     GENTAMICIN  Sensitive  <=0.5  Final     Method:  MIC     LEVOFLOXACIN  Intermediate  4  Final     Method:  MIC     MOXIFLOXACIN  Resistant  2  Final     Method:  MIC     OXACILLIN  Sensitive  1  Final     Method:  MIC     PENICILLIN  Resistant  >=0.5  Final     Method:  MIC     RIFAMPIN  Sensitive  <=0.5  Final     Method:  MIC     TETRACYCLINE  Sensitive  <=1  Final     Method:  MIC     TRIMETH/SULFA  Sensitive  <=10  Final     Method:  MIC     VANCOMYCIN  Sensitive  1  Final     Method:  MIC      Comments  STAPHYLOCOCCUS AUREUS (MIC)       ABUNDANT STAPHYLOCOCCUS AUREUS          Assessment/Plan Principal Problem:  *Cellulitis and abscess of trunk Active Problems:  Staph aureus infection  Abnormal LFTs  Hyponatremia  Paraplegia  Encephalopathy acute  Nausea & vomiting  Diarrhea   1. Methicillin sensitive Staphylococcus aureus abscess and cellulitis of the left lateral abdominal wall. Suspect is by now on the resolution track. Treat with Ancef IV while in the hospital 2. Toxic metabolic encephalopathy-most likely due to septic encephalopathy. Probably patient has bacteremia. Obtain blood cultures and start IV antibiotics. Place on IV fluids and avoid sedating medications 3. Paraplegia-with worsening pain over the lumbar sacral spine-plan for stat MRI of the lumbar spine with IV contrast. 4. Abnormal LFTs-CT scan without any common bile duct stones, no alcohol abuse, probably related to the acute infectious illness. For completeness we'll obtain hepatitis A, B, and C serology 5. Nausea vomiting and  diarrhea probably related to clindamycin and Bactrim and from the systemic symptoms related to  the staph aureus infection - symptomatic treatment, check C. difficile as well.    Code Status: full  Family Communication: wife at bedside  Disposition Plan: unclear   Time spent: 1 hour  Eloni Darius [facility] Pager [phone]  If 7PM-7AM, please contact night-coverage [email or web address] Password [facility] 07/06/2012, 5:31 PM

## 2012-07-06 NOTE — ED Provider Notes (Signed)
History     CSN: [id number]  Arrival date & time 07/06/12  1445   First MD Initiated Contact with Patient 07/06/12 1512      Chief Complaint  Patient presents with  . Fever    (Consider location/radiation/quality/duration/timing/severity/associated sxs/prior treatment) HPI  Patient presents to the ER bib wife for emesis and weakness. He was seen Monday for an abscess to his left flank, it was Iand D. He came back Wednesday for fevers, a CT scan was also done showing cellulitis. He was played on cellulitis protocol and given three rounds of Clindamycin. He sent home with oral clindamycin. Today his wife brings him back to the emergency department because since discharge has been unable to tolerate his oral clindamycin. She says that he has been vomiting anything that he eats or drinks. He has not had any anti-emetics at home therefore his spouse brought him back to the ER for further evaluation as he is now weak and still having low grade fevers.   No past medical history on file.  Past Surgical History  Procedure Date  . Cholecystectomy     No family history on file.  History  Substance Use Topics  . Smoking status: Never Smoker   . Smokeless tobacco: Not on file  . Alcohol Use: No      Review of Systems  Review of Systems  Gen: no weight loss, , chills, night sweats,  + fevers and + weakness Eyes: no discharge or drainage, no occular pain or visual changes  Nose: no epistaxis or rhinorrhea  Mouth: no dental pain, no sore throat  Neck: no neck pain  Lungs:No wheezing, coughing or hemoptysis CV: no chest pain, palpitations, dependent edema or orthopnea  Abd: + abdominal wall pain, nausea, vomiting without diarrhea  GU: no dysuria or gross hematuria  MSK:  No abnormalities  Neuro: no headache, no focal neurologic deficits  Skin: cellulitis to left flank Psyche: negative.   Allergies  Review of patient's allergies indicates no known allergies.  Home Medications    Current Outpatient Rx  Name  Route  Sig  Dispense  Refill  . ACETAMINOPHEN-CODEINE #3 300-30 MG PO TABS   Oral   Take 1-2 tablets by mouth every 6 (six) hours as needed. For pain         . ASPIRIN 325 MG PO TABS   Oral   Take 325 mg by mouth daily as needed. For pain         . SULFAMETHOXAZOLE-TRIMETHOPRIM 800-160 MG PO TABS   Oral   Take 1 tablet by mouth 2 (two) times daily. For 7 days; Start date 07/01/12           BP 130/83  Pulse 94  Temp 100.4 F (38 C) (Oral)  Resp 17  SpO2 97%  Physical Exam  Nursing note and vitals reviewed. Constitutional: He appears well-developed and well-nourished. He appears distressed (lethargic but arousable).  HENT:  Head: Normocephalic and atraumatic.  Eyes: Pupils are equal, round, and reactive to light.  Neck: Normal range of motion. Neck supple.  Cardiovascular: Normal rate and regular rhythm.   Pulmonary/Chest: Effort normal.  Abdominal: Soft. He exhibits no distension. There is tenderness (over left I and D). There is no rebound and no guarding.  Neurological: He is alert.  Skin: Skin is warm and dry.    ED Course  Procedures (including critical care time)  Labs Reviewed  GLUCOSE, CAPILLARY - Abnormal; Notable for the following:    Glucose-Capillary  109 (*)     All other components within normal limits  CBC WITH DIFFERENTIAL - Abnormal; Notable for the following:    Monocytes Relative 13 (*)     All other components within normal limits  COMPREHENSIVE METABOLIC PANEL - Abnormal; Notable for the following:    Sodium 130 (*)     Glucose, Bld 110 (*)     Albumin 2.8 (*)     AST 205 (*)     ALT 278 (*)     Alkaline Phosphatase 147 (*)     GFR calc non Af Amer 86 (*)     All other components within normal limits   No results found.   1. Fever   2. Nausea and vomiting   3. Hyponatremia   4. Elevated liver function tests   5. Cellulitis   6. Weakness       MDM   Date: 07/06/2012  Rate: 89  Rhythm:  normal sinus rhythm  QRS Axis: normal  Intervals: normal  ST/T Wave abnormalities: normal  Conduction Disutrbances:atrial premature complex  Narrative Interpretation:   Old EKG Reviewed: unchanged from Jul 03, 2012   Dr. Preston Fleeting has seen patient. We feel that since this is patients 3rd visit and he has failed outpatient treatment that he needs to be admitted for further management as an inpatient. elevated LFTs and albumin need to be evaluated as well.   [facility] Team 9, Dr. Lavera Guise, Med/surg, inpatient.    Dorthula Matas, PA 07/06/12 1651

## 2012-07-06 NOTE — ED Notes (Signed)
Was here for lancing of boil on Monday. Back on Wednesday for fever and was started on Antibiotics. Has had nausea/vomitting and fever every since. Not able to keep antibiotics down with or without food.

## 2012-07-07 ENCOUNTER — Encounter (HOSPITAL_COMMUNITY): Payer: Self-pay | Admitting: *Deleted

## 2012-07-07 DIAGNOSIS — R197 Diarrhea, unspecified: Secondary | ICD-10-CM

## 2012-07-07 LAB — PROTIME-INR: INR: 1.08 (ref 0.00–1.49)

## 2012-07-07 LAB — COMPREHENSIVE METABOLIC PANEL
ALT: 239 U/L — ABNORMAL HIGH (ref 0–53)
Alkaline Phosphatase: 140 U/L — ABNORMAL HIGH (ref 39–117)
BUN: 14 mg/dL (ref 6–23)
CO2: 22 mEq/L (ref 19–32)
Chloride: 104 mEq/L (ref 96–112)
GFR calc Af Amer: >90 mL/min (ref >90)
GFR calc non Af Amer: >90 mL/min (ref >90)
Glucose, Bld: 97 mg/dL (ref 70–99)
Potassium: 3.8 mEq/L (ref 3.5–5.1)
Sodium: 135 mEq/L (ref 135–145)
Total Bilirubin: 0.5 mg/dL (ref 0.3–1.2)
Total Protein: 6.5 g/dL (ref 6.0–8.3)

## 2012-07-07 LAB — APTT: aPTT: 32 seconds (ref 24–37)

## 2012-07-07 LAB — CBC
MCHC: 35.9 g/dL (ref 30.0–36.0)
Platelets: 255 10*3/uL (ref 150–400)
RDW: 13.5 % (ref 11.5–15.5)
WBC: 4.8 10*3/uL (ref 4.0–10.5)

## 2012-07-07 LAB — HEPATITIS PANEL, ACUTE: HCV Ab: NEGATIVE

## 2012-07-07 LAB — CLOSTRIDIUM DIFFICILE BY PCR: Toxigenic C. Difficile by PCR: NEGATIVE

## 2012-07-07 MED ORDER — ENOXAPARIN SODIUM 40 MG/0.4ML ~~LOC~~ SOLN
40.0000 mg | SUBCUTANEOUS | Status: DC
  Administered 2012-07-07 – 2012-07-08 (×2): 40 mg via SUBCUTANEOUS
  Filled 2012-07-07 (×3): qty 0.4

## 2012-07-07 NOTE — Progress Notes (Signed)
PATIENT DETAILS Name: Anthony Delgado Age: 36 y.o. Sex: male Date of Birth: 23-Sep-1975 Admit Date: 07/06/2012 Admitting Physician Sorin Luanne Bras, MD PCP:Pcp Not In System  Subjective: Awake alert, moving all 4 extremities.  Assessment/Plan: Principal Problem:  *Cellulitis and abscess of trunk -MSSA on wound cultures 11/6 -c/w  IV Ancef  Active Problems: SIR's -2/2 to above -high suspicion for bacteremia -await Blood cultures  AMS -resolved-now awake and alert -2/2 Toxic Metabolic Encephalopathy  Gen weakness-with worse in B/L lower extremities -today-no deficits -moving all 4 extremities today,patient himself claims that he has more strength today in his legs compared to yesterday  Transaminitis -likely secondary to SIR's/sepsis -monitor -hepatitis panel negative  Nausea vomiting and diarrhea  -resolved -await Cdiff PCR -advance diet   Hyponatremia -resolved  Disposition: Remain inpatient  DVT Prophylaxis: Prophylactic Lovenox   Code Status: Full code   Procedures: None  CONSULTS:  None  PHYSICAL EXAM: Vital signs in last 24 hours: Filed Vitals:   07/06/12 1457 07/06/12 1506 07/06/12 2009 07/07/12 0504  BP:  130/83 115/73 135/76  Pulse:  94 89 79  Temp:  100.4 F (38 C) 99.1 F (37.3 C) 99.5 F (37.5 C)  TempSrc:  Oral Oral Oral  Resp:  17 17 17   Height:   6' (1.829 m)   Weight:   119.2 kg (262 lb 12.6 oz)   SpO2: 94% 97% 97% 96%    Weight change:  Body mass index is 35.64 kg/(m^2).   Gen Exam: Awake and alert with clear speech.  Neck: Supple, No JVD.   Chest: B/L Clear.   CVS: S1 S2 Regular, no murmurs.  Abdomen: soft, BS +, non tender, non distended. Extremities: no edema, lower extremities warm to touch. Neurologic: Non Focal.  Lower ext 4/5 Skin: No Rash.   Wounds: N/A.    Intake/Output from previous day:  Intake/Output Summary (Last 24 hours) at 07/07/12 1452 Last data filed at 07/07/12 1100  Gross per 24 hour  Intake    1775 ml  Output    500 ml  Net   1275 ml     LAB RESULTS: CBC  Lab 07/07/12 0630 07/06/12 1531 07/03/12 1942 07/01/12 1501 07/01/12 1417  WBC 4.8 5.7 3.8* -- 6.7  HGB 13.0 14.5 14.7 16.3 15.8  HCT 36.2* 41.0 40.4 48.0 43.8  PLT 255 246 218 -- 245  MCV 87.0 87.0 87.1 -- 88.1  MCH 31.3 30.8 31.7 -- 31.8  MCHC 35.9 35.4 36.4* -- 36.1*  RDW 13.5 13.3 13.3 -- 13.1  LYMPHSABS -- 0.8 0.7 -- 2.3  MONOABS -- 0.8 0.2 -- 0.4  EOSABS -- 0.1 0.0 -- 0.2  BASOSABS -- 0.0 0.0 -- 0.0  BANDABS -- -- -- -- --    Chemistries   Lab 07/07/12 0630 07/06/12 1531 07/03/12 1942 07/01/12 1501  NA 135 130* 131* 142  K 3.8 3.5 3.3* 3.6  CL 104 98 97 106  CO2 22 21 22  --  GLUCOSE 97 110* 101* 91  BUN 14 14 17 11   CREATININE 1.03 1.08 1.41* 1.20  CALCIUM 8.2* 8.5 8.7 --  MG -- -- -- --    CBG:  Lab 07/06/12 1448  GLUCAP 109*    GFR Estimated Creatinine Clearance: 129.6 ml/min (by C-G formula based on Cr of 1.03).  Coagulation profile  Lab 07/07/12 0630  INR 1.08  PROTIME --    Cardiac Enzymes No results found for this basename: CK:3,CKMB:3,TROPONINI:3,MYOGLOBIN:3 in the last 168 hours  No components found  with this basename: POCBNP:3 No results found for this basename: DDIMER:2 in the last 72 hours No results found for this basename: HGBA1C:2 in the last 72 hours No results found for this basename: CHOL:2,HDL:2,LDLCALC:2,TRIG:2,CHOLHDL:2,LDLDIRECT:2 in the last 72 hours No results found for this basename: TSH,T4TOTAL,FREET3,T3FREE,THYROIDAB in the last 72 hours No results found for this basename: VITAMINB12:2,FOLATE:2,FERRITIN:2,TIBC:2,IRON:2,RETICCTPCT:2 in the last 72 hours No results found for this basename: LIPASE:2,AMYLASE:2 in the last 72 hours  Urine Studies No results found for this basename: UACOL:2,UAPR:2,USPG:2,UPH:2,UTP:2,UGL:2,UKET:2,UBIL:2,UHGB:2,UNIT:2,UROB:2,ULEU:2,UEPI:2,UWBC:2,URBC:2,UBAC:2,CAST:2,CRYS:2,UCOM:2,BILUA:2 in the last 72  hours  MICROBIOLOGY: Recent Results (from the past 240 hour(s))  URINE CULTURE     Status: Normal   Collection Time   07/03/12  9:56 PM      Component Value Range Status Comment   Specimen Description URINE, CLEAN CATCH   Final    Special Requests NONE   Final    Culture  Setup Time 07/03/2012 22:27   Final    Colony Count NO GROWTH   Final    Culture NO GROWTH   Final    Report Status 07/04/2012 FINAL   Final   WOUND CULTURE     Status: Normal   Collection Time   07/03/12 10:07 PM      Component Value Range Status Comment   Specimen Description WOUND LEFT ABDOMEN   Final    Special Requests Normal   Final    Gram Stain     Final    Value: FEW WBC PRESENT, PREDOMINANTLY PMN     NO SQUAMOUS EPITHELIAL CELLS SEEN     MODERATE GRAM POSITIVE COCCI IN PAIRS     IN CLUSTERS IN CHAINS   Culture     Final    Value: ABUNDANT STAPHYLOCOCCUS AUREUS     Note: RIFAMPIN AND GENTAMICIN SHOULD NOT BE USED AS SINGLE DRUGS FOR TREATMENT OF STAPH INFECTIONS. This organism DOES NOT demonstrate inducible Clindamycin resistance in vitro.   Report Status 07/06/2012 FINAL   Final    Organism ID, Bacteria STAPHYLOCOCCUS AUREUS   Final   CLOSTRIDIUM DIFFICILE BY PCR     Status: Normal   Collection Time   07/07/12  9:48 AM      Component Value Range Status Comment   C difficile by pcr NEGATIVE  NEGATIVE Final     RADIOLOGY STUDIES/RESULTS: Dg Chest 2 View  07/03/2012  *RADIOLOGY REPORT*  Clinical Data: Chest pain and shortness of breath; fever  CHEST - 2 VIEW  Comparison: None.  Findings: There is minimal scarring in the left lower lobe.  The lungs are otherwise clear.  Heart size and pulmonary vascularity are normal.  No adenopathy.  No bone lesions.  IMPRESSION: No edema or consolidation.   Original Report Authenticated By: Bretta Bang, M.D.    Mr Lumbar Spine W Wo Contrast  07/06/2012  *RADIOLOGY REPORT*  Clinical Data: Paraplegia.  Recent fevers and abdominal wall abscess.  Vomiting and  diarrhea.  Lethargy and weakness. Somnolence.  MRI LUMBAR SPINE WITHOUT AND WITH CONTRAST  Technique:  Multiplanar and multiecho pulse sequences of the lumbar spine were obtained without and with intravenous contrast.  Contrast:  20 ml Multihance  Comparison: 07/04/2012  Findings: The lowest full intervertebral disk space is labeled L5- S1.  If procedural intervention is to be performed, careful correlation with this numbering strategy is recommended.  The conus medullaris appears unremarkable.  Conus level:  L1.  No significant vertebral subluxation.  Inversion recovery weighted images demonstrate no significant abnormal vertebral or periligamentous edema.  No findings of diskitis-osteomyelitis or epidural abscess.  No significant abnormal enhancement is observed in the epidural space. No clumping of nerve roots or abnormal nerve root enhancement to favor arachnoiditis.  The psoas and paraspinal musculature appears unremarkable.  Epidural adipose tissue is sharply defined in the lumbar spine, without findings of lumbar epidural hematoma or compression of the conus or nerve roots.  Additional findings at individual levels are as follows:  T12-L1:  Unremarkable.  L1-2:  Unremarkable.  L2-3:  Unremarkable.  L3-4:  Unremarkable.  L4-5:  Unremarkable.  L5-S1:  Unremarkable.  IMPRESSION:  1.  No abnormality in the lumbar spine to account the patient's lower extremity weakness and hyperreflexia.  We imaged the mid T11 level down to the S2-3 level on today's examination.  If neurologic exam findings raise the possibility of the thoracic spine lesion or higher lesion, then additional spinal imaging may be indicated.   Original Report Authenticated By: Gaylyn Rong, M.D.    Ct Abdomen Pelvis W Contrast  07/04/2012  *RADIOLOGY REPORT*  Clinical Data: Abdominal pain, body aches, nausea, diarrhea and vomiting.  Fever.  Recent I&D of abdominal wall abscess.  Assess for residual abscess.  CT ABDOMEN AND PELVIS WITH  CONTRAST  Technique:  Multidetector CT imaging of the abdomen and pelvis was performed following the standard protocol during bolus administration of intravenous contrast.  Contrast: OMNIPAQUE IOHEXOL 300 MG/ML  SOLN  Comparison: Abdominal ultrasound performed 03/06/2008  Findings: Minimal bibasilar atelectasis is noted.  At the left mid abdomen, there is a small focal soft tissue wound, with associated skin thickening and underlying soft tissue inflammation.  No associated fluid collection is identified; this likely reflects the recently drained abdominal wall abscess.  There is no evidence of residual or recurrent abscess at this location. Soft tissue inflammation remains relatively superficial, extending approximately 2.3 cm deep to the skin surface.  The liver and spleen are unremarkable in appearance.  The patient is status post cholecystectomy, with clips noted at the gallbladder fossa.  The pancreas and adrenal glands are unremarkable.  A 5 mm stone is noted at the interpole region of the left kidney; a 9 mm hypodensity near the lower pole of the right kidney likely reflects a cyst.  There is no evidence of hydronephrosis.  No obstructing ureteral stones are seen.  No perinephric stranding is appreciated.  No free fluid is identified.  The small bowel is unremarkable in appearance.  The stomach is within normal limits.  No acute vascular abnormalities are seen.  The appendix is normal in caliber, without evidence for appendicitis.  The colon is unremarkable in appearance.  The bladder is mildly distended and grossly unremarkable in appearance.  The prostate is normal in size.  A small left inguinal hernia is noted, containing only fat.  No inguinal lymphadenopathy is seen.  No acute osseous abnormalities are identified.  IMPRESSION:  1.  No evidence of residual or recurrent abscess; small focal soft tissue wound at the left mid abdomen demonstrates skin thickening and underlying soft tissue  inflammation.  It remains relatively superficial, extending up to 2.3 cm deep to the skin surface. 2.  5 mm stone at the interpole region of the left kidney; small right renal cyst seen. 3.  Small left inguinal hernia, containing only fat.   Original Report Authenticated By: Tonia Ghent, M.D.     MEDICATIONS: Scheduled Meds:   . [COMPLETED] acetaminophen  650 mg Oral Once  .  ceFAZolin (ANCEF) IV  1 g  Intravenous Q8H  . [COMPLETED] ondansetron  4 mg Intravenous Once  . [COMPLETED] sodium chloride  1,000 mL Intravenous Once  . [COMPLETED] sodium chloride  500 mL Intravenous Once  . [DISCONTINUED] sodium chloride   Intravenous STAT   Continuous Infusions:   . sodium chloride 100 mL/hr at 07/07/12 1303   PRN Meds:.acetaminophen, acetaminophen, alum & mag hydroxide-simeth, [EXPIRED] gadobenate dimeglumine, morphine injection, [EXPIRED] ondansetron (ZOFRAN) IV, ondansetron (ZOFRAN) IV, ondansetron, oxyCODONE  Antibiotics: Anti-infectives     Start     Dose/Rate Route Frequency Ordered Stop   07/06/12 1700   ceFAZolin (ANCEF) IVPB 1 g/50 mL premix        1 g 100 mL/hr over 30 Minutes Intravenous 3 times per day 07/06/12 1653            Jeoffrey Massed, MD  [facility] Pager:[phone]  If 7PM-7AM, please contact night-coverage [email or web address] Password [email or web address] 07/07/2012, 2:52 PM   LOS: 1 day

## 2012-07-08 DIAGNOSIS — L03319 Cellulitis of trunk, unspecified: Secondary | ICD-10-CM

## 2012-07-08 DIAGNOSIS — L02219 Cutaneous abscess of trunk, unspecified: Secondary | ICD-10-CM

## 2012-07-08 LAB — COMPREHENSIVE METABOLIC PANEL
Albumin: 2.4 g/dL — ABNORMAL LOW (ref 3.5–5.2)
Alkaline Phosphatase: 142 U/L — ABNORMAL HIGH (ref 39–117)
BUN: 11 mg/dL (ref 6–23)
Calcium: 8.3 mg/dL — ABNORMAL LOW (ref 8.4–10.5)
GFR calc Af Amer: >90 mL/min (ref >90)
Potassium: 3.4 mEq/L — ABNORMAL LOW (ref 3.5–5.1)
Sodium: 135 mEq/L (ref 135–145)
Total Protein: 6.3 g/dL (ref 6.0–8.3)

## 2012-07-08 LAB — CBC
MCH: 30.2 pg (ref 26.0–34.0)
MCHC: 34.8 g/dL (ref 30.0–36.0)
RDW: 13.2 % (ref 11.5–15.5)

## 2012-07-08 NOTE — Consult Note (Signed)
[facility]  Total days of antibiotics 3        Day 3 cefazolin        2 days of bactrim (11/4-11/06)        2 days of clinda (11/06 -11/09)       Reason for Consult: abdominal wall abscess/cellulitis    Referring Physician: ghimire  Principal Problem:  *Cellulitis and abscess of trunk Active Problems:  Staph aureus infection  Abnormal LFTs  Hyponatremia  Paraplegia  Encephalopathy acute  Nausea & vomiting  Diarrhea    HPI: Anthony Delgado is a 36 y.o. male in previous good health over a week ago when he noticed a tender raised lesion on his left abdominal which he thought it was a boil. He placed hot compress on it, and it started to become a head and spontaneously drain a sangiounous like exudate. Since, it was still quite tender, He went to the ED on 11/4 to have an I X D where he was placed bactrim. That night, he reported having onset of fevers, nausea and vomiting. He subsequently returned to the ED on 11/6 for evaluation. His diff on his cbc showed 76%, up from 57% on 11/4. He had temp of 101.4, no blood cultures taken at that time, but interestingly he had a transaminitis with ALT/AST of 204 & 229, respectively. Abdominal CT did not show any residual abscess, but still had cellulitis appearance to abdominal wall. He did not have SIRS at that time, and was switched to oral clindamycin and sent home. He continued to decompensate at home in the sense that his nausea and vomiting were persistent as well as diarrhea. He was not able to keep much down. His wife noticed his urine becoming darker in addition to general weakness. He felt like he was having chest pain similar to GERD like sensation that he had in the past. He was brought in on 11/9 for evaluation/admitted for dehydration. He still did not have leukocytosis,no hypotension. His BMP was reflective of dehydration/hyponatremia/hypovolemia.LFT were still elevated in the mid 250s. Lactate was normal. He  complained of having significant back pain, unclear if it was from wretching, thus he underwent MRI of back which was not consistent with any diskitis. He had blood cx on admit (but drawn while on antibiotics) which are no growth to date. He denies any sick contact. No-one being sick off of any meals. He was placed on cefazolin concerning for sepsis and possible bacteremia.  Since being admitted he feels better, denies fever/NS/chills. His nausea is somewhat improved when they give him anti-emetics before he eats. No abdominal pain. Diarrhea resolved since 2 days ago.   History reviewed. No pertinent past medical history. S/p chole in 2009  Allergies: No Known Allergies  MEDICATIONS:    .  ceFAZolin (ANCEF) IV  1 g Intravenous Q8H  . enoxaparin (LOVENOX) injection  40 mg Subcutaneous Q24H    History  Substance Use Topics  . Smoking status: Never Smoker   . Smokeless tobacco: Not on file  . Alcohol Use: No    Family History  Problem Relation Age of Onset  . Cancer Mother      Review of Systems  Constitutional: positive for fever, chills, diaphoresis, activity change, appetite change, fatigue but now improved since admit. HENT: Negative for congestion, sore throat, rhinorrhea, sneezing, trouble swallowing and sinus pressure.  Eyes: Negative for photophobia and visual disturbance.  Respiratory: Negative for cough, chest tightness, shortness of breath,  wheezing and stridor.  Cardiovascular: Negative for chest pain, palpitations and leg swelling.  Gastrointestinal: positive  for nausea, vomiting, but negative abdominal pain, diarrhea, constipation, blood in stool, abdominal distention and anal bleeding.  Genitourinary: Negative for dysuria, hematuria, flank pain and difficulty urinating.  Musculoskeletal: Negative for myalgias, back pain, joint swelling, arthralgias and gait problem.  Skin: Negative for color change, pallor, rash and wound.  Neurological: Negative for dizziness,  tremors, weakness and light-headedness.  Hematological: Negative for adenopathy. Does not bruise/bleed easily.  Psychiatric/Behavioral: Negative for behavioral problems, confusion, sleep disturbance, dysphoric mood, decreased concentration and agitation.     OBJECTIVE: Temp:  [98.4 F (36.9 C)-99.1 F (37.3 C)] 98.4 F (36.9 C) (11/11 1500) Pulse Rate:  [69-73] 69  (11/11 1500) Resp:  [17-18] 18  (11/11 1500) BP: (123-137)/(76-84) 125/78 mmHg (11/11 1500) SpO2:  [96 %-99 %] 97 % (11/11 1500) Physical Exam  Constitutional: He is oriented to person, place, and time. He appears well-developed and well-nourished. No distress.  HENT:  Mouth/Throat: Oropharynx is clear and moist. No oropharyngeal exudate.  Cardiovascular: Normal rate, regular rhythm and normal heart sounds. Exam reveals no gallop and no friction rub.  No murmur heard.  Pulmonary/Chest: Effort normal and breath sounds normal. No respiratory distress. He has no wheezes.  Abdominal: Soft. Bowel sounds are normal. He exhibits no distension. There is no tenderness.  Lymphadenopathy:  no cervical adenopathy.  Neurological: CN2-12 intact, motor 5/5 UE bilateral, 4/5 LE Skin: Skin is warm and dry. LEFT lower quadrant, he has a quarter size ulcer from IXD, some fibrinous exudate in place, no active purulent drainage with 1cm induration on lateral borders only Psychiatric: He has a normal mood and affect. His behavior is normal.    LABS: Results for orders placed during the hospital encounter of 07/06/12 (from the past 48 hour(s))  CULTURE, BLOOD (ROUTINE X 2)     Status: Normal (Preliminary result)   Collection Time   07/06/12  5:19 PM      Component Value Range Comment   Specimen Description BLOOD LEFT ARM      Special Requests BOTTLES DRAWN AEROBIC AND ANAEROBIC 10CC      Culture  Setup Time 07/07/2012 12:24      Culture        Value:        BLOOD CULTURE RECEIVED NO GROWTH TO DATE CULTURE WILL BE HELD FOR 5 DAYS BEFORE  ISSUING A FINAL NEGATIVE REPORT   Report Status PENDING     LACTIC ACID, PLASMA     Status: Normal   Collection Time   07/06/12  5:30 PM      Component Value Range Comment   Lactic Acid, Venous 0.9  0.5 - 2.2 mmol/L   SEDIMENTATION RATE     Status: Abnormal   Collection Time   07/06/12  5:30 PM      Component Value Range Comment   Sed Rate 38 (*) 0 - 16 mm/hr   HEPATITIS PANEL, ACUTE     Status: Normal   Collection Time   07/06/12  5:30 PM      Component Value Range Comment   Hepatitis B Surface Ag NEGATIVE  NEGATIVE    HCV Ab NEGATIVE  NEGATIVE    Hep A IgM NEGATIVE  NEGATIVE    Hep B C IgM NEGATIVE  NEGATIVE   CULTURE, BLOOD (ROUTINE X 2)     Status: Normal (Preliminary result)   Collection Time   07/06/12  5:35 PM  Component Value Range Comment   Specimen Description BLOOD LEFT HAND      Special Requests BOTTLES DRAWN AEROBIC ONLY 6CC      Culture  Setup Time 07/07/2012 12:24      Culture        Value:        BLOOD CULTURE RECEIVED NO GROWTH TO DATE CULTURE WILL BE HELD FOR 5 DAYS BEFORE ISSUING A FINAL NEGATIVE REPORT   Report Status PENDING     COMPREHENSIVE METABOLIC PANEL     Status: Abnormal   Collection Time   07/07/12  6:30 AM      Component Value Range Comment   Sodium 135  135 - 145 mEq/L    Potassium 3.8  3.5 - 5.1 mEq/L    Chloride 104  96 - 112 mEq/L    CO2 22  19 - 32 mEq/L    Glucose, Bld 97  70 - 99 mg/dL    BUN 14  6 - 23 mg/dL    Creatinine, Ser 1.61  0.50 - 1.35 mg/dL    Calcium 8.2 (*) 8.4 - 10.5 mg/dL    Total Protein 6.5  6.0 - 8.3 g/dL    Albumin 2.4 (*) 3.5 - 5.2 g/dL    AST 096 (*) 0 - 37 U/L    ALT 239 (*) 0 - 53 U/L    Alkaline Phosphatase 140 (*) 39 - 117 U/L    Total Bilirubin 0.5  0.3 - 1.2 mg/dL    GFR calc non Af Amer >90  >90 mL/min    GFR calc Af Amer >90  >90 mL/min   CBC     Status: Abnormal   Collection Time   07/07/12  6:30 AM      Component Value Range Comment   WBC 4.8  4.0 - 10.5 K/uL    RBC 4.16 (*) 4.22 - 5.81 MIL/uL     Hemoglobin 13.0  13.0 - 17.0 g/dL    HCT 04.5 (*) 40.9 - 52.0 %    MCV 87.0  78.0 - 100.0 fL    MCH 31.3  26.0 - 34.0 pg    MCHC 35.9  30.0 - 36.0 g/dL    RDW 81.1  91.4 - 78.2 %    Platelets 255  150 - 400 K/uL   PROTIME-INR     Status: Normal   Collection Time   07/07/12  6:30 AM      Component Value Range Comment   Prothrombin Time 13.9  11.6 - 15.2 seconds    INR 1.08  0.00 - 1.49   APTT     Status: Normal   Collection Time   07/07/12  6:30 AM      Component Value Range Comment   aPTT 32  24 - 37 seconds   CLOSTRIDIUM DIFFICILE BY PCR     Status: Normal   Collection Time   07/07/12  9:48 AM      Component Value Range Comment   C difficile by pcr NEGATIVE  NEGATIVE   CBC     Status: Abnormal   Collection Time   07/08/12  6:15 AM      Component Value Range Comment   WBC 5.0  4.0 - 10.5 K/uL    RBC 4.04 (*) 4.22 - 5.81 MIL/uL    Hemoglobin 12.2 (*) 13.0 - 17.0 g/dL    HCT 95.6 (*) 21.3 - 52.0 %    MCV 86.9  78.0 - 100.0 fL  MCH 30.2  26.0 - 34.0 pg    MCHC 34.8  30.0 - 36.0 g/dL    RDW 96.0  45.4 - 09.8 %    Platelets 261  150 - 400 K/uL   COMPREHENSIVE METABOLIC PANEL     Status: Abnormal   Collection Time   07/08/12  6:15 AM      Component Value Range Comment   Sodium 135  135 - 145 mEq/L    Potassium 3.4 (*) 3.5 - 5.1 mEq/L    Chloride 101  96 - 112 mEq/L    CO2 27  19 - 32 mEq/L    Glucose, Bld 90  70 - 99 mg/dL    BUN 11  6 - 23 mg/dL    Creatinine, Ser 1.19  0.50 - 1.35 mg/dL    Calcium 8.3 (*) 8.4 - 10.5 mg/dL    Total Protein 6.3  6.0 - 8.3 g/dL    Albumin 2.4 (*) 3.5 - 5.2 g/dL    AST 89 (*) 0 - 37 U/L    ALT 176 (*) 0 - 53 U/L    Alkaline Phosphatase 142 (*) 39 - 117 U/L    Total Bilirubin 0.4  0.3 - 1.2 mg/dL    GFR calc non Af Amer >90  >90 mL/min    GFR calc Af Amer >90  >90 mL/min     MICRO: 11/9 blood cx NGTD x 2 11/10 cdiff negative 11/6  Wound cx MSSA 11/6 urine cx NGTD  IMAGING: Mr Lumbar Spine W Wo Contrast  07/06/2012   *RADIOLOGY REPORT*  Clinical Data: Paraplegia.  Recent fevers and abdominal wall abscess.  Vomiting and diarrhea.  Lethargy and weakness. Somnolence.  MRI LUMBAR SPINE WITHOUT AND WITH CONTRAST  Technique:  Multiplanar and multiecho pulse sequences of the lumbar spine were obtained without and with intravenous contrast.  Contrast:  20 ml Multihance  Comparison: 07/04/2012  Findings: The lowest full intervertebral disk space is labeled L5- S1.  If procedural intervention is to be performed, careful correlation with this numbering strategy is recommended.  The conus medullaris appears unremarkable.  Conus level:  L1.  No significant vertebral subluxation.  Inversion recovery weighted images demonstrate no significant abnormal vertebral or periligamentous edema.  No findings of diskitis-osteomyelitis or epidural abscess.  No significant abnormal enhancement is observed in the epidural space. No clumping of nerve roots or abnormal nerve root enhancement to favor arachnoiditis.  The psoas and paraspinal musculature appears unremarkable.  Epidural adipose tissue is sharply defined in the lumbar spine, without findings of lumbar epidural hematoma or compression of the conus or nerve roots.  Additional findings at individual levels are as follows:  T12-L1:  Unremarkable.  L1-2:  Unremarkable.  L2-3:  Unremarkable.  L3-4:  Unremarkable.  L4-5:  Unremarkable.  L5-S1:  Unremarkable.  IMPRESSION:  1.  No abnormality in the lumbar spine to account the patient's lower extremity weakness and hyperreflexia.  We imaged the mid T11 level down to the S2-3 level on today's examination.  If neurologic exam findings raise the possibility of the thoracic spine lesion or higher lesion, then additional spinal imaging may be indicated.   Original Report Authenticated By: Gaylyn Rong, M.D.     Assessment/Plan:  36yo Male previously healthy, recently had abdominal wall abscess I X D and progressively had gastroenteritis and febrile  illness. Not completely sure that this was related to his abdominal abscess. He appears to have had some improvement with oral antibiotics in the 4 days  prior to admission, per his wife, although his GI symptoms led to his dehydration and weakness.  - recommend once his nausea is under control to switch him to keflex 500mg  QID until 07/16/2012 - GI symptoms appears to be more consistent with severe viral gastroenteritis that is now improving - can consider getting CMV IgM serology to account for elevated transaminitis plus GI symptoms - would recommend checking for HIV as part of routine healthcare, per CDC recs. - will follow up culture data and change recs if need be, for now would discharge on oral antibiotics for MSSA cellulitis  Kori Colin B. Drue Second MD MPH [facility] [phone]

## 2012-07-08 NOTE — Progress Notes (Signed)
OT NOTE  Pt now back to baseline functionally per PT.  Has no further balance or functional issues.  No need for OT evaluation at this time, will sign off.   Perrin Maltese, OTR/L Pager number [phone] 07/08/2012

## 2012-07-08 NOTE — Progress Notes (Signed)
PATIENT DETAILS Name: Anthony Delgado Age: 36 y.o. Sex: male Date of Birth: [birth year] Admit Date: 07/06/2012 Admitting Physician Sorin Luanne Bras, MD PCP:Pcp Not In System  Subjective: Able to walk now.  Reports no vomiting overnight.  Assessment/Plan: Principal Problem:  *Cellulitis and abscess of trunk -MSSA on wound cultures 11/6 -c/w  IV Ancef -ID Consulted for probable Bacteremia.  Blood cultures were obtained after patient had been on antibiotics for several days.  Active Problems: SIR's -resolved. -2/2 to above -high suspicion for bacteremia  AMS -resolved-now awake and alert -2/2 Toxic Metabolic Encephalopathy -do not think that a small abscess can cause this much of symptoms-likely bacteremia  Gen weakness-with worse in B/L lower extremities -today-no deficits -moving all 4 extremities today,and now ambulating -PT eval pending.  Transaminitis -likely secondary to SIR's/sepsis -improving. -hepatitis panel negative  Nausea vomiting and diarrhea  -resolved - Cdiff PCR-negative -advance diet-tolerating well.   Hyponatremia -resolved  Disposition: To home when appropriate.  ID consult pending.  DVT Prophylaxis: Prophylactic Lovenox   Code Status: Full code   Procedures: None  CONSULTS:  ID  PHYSICAL EXAM: Vital signs in last 24 hours: Filed Vitals:   07/07/12 0504 07/07/12 1519 07/07/12 2059 07/08/12 0557  BP: 135/76 124/80 137/84 123/76  Pulse: 79 80 73 72  Temp: 99.5 F (37.5 C) 98.9 F (37.2 C) 99.1 F (37.3 C) 98.9 F (37.2 C)  TempSrc: Oral Oral Oral Oral  Resp: 17  17 17   Height:      Weight:      SpO2: 96% 100% 96% 99%    Weight change:  Body mass index is 35.64 kg/(m^2).   Gen Exam: Awake and alert with clear speech.  Neck: Supple, No JVD.   Chest: B/L Clear.   CVS: S1 S2 Regular, no murmurs.  Abdomen: soft, BS +, non tender, non distended. Extremities: no edema, lower extremities warm to touch. Neurologic: Non Focal.   Lower ext 4/5 Skin: No Rash.   Wounds: N/A.    Intake/Output from previous day:  Intake/Output Summary (Last 24 hours) at 07/08/12 0957 Last data filed at 07/08/12 0826  Gross per 24 hour  Intake 3428.33 ml  Output    500 ml  Net 2928.33 ml     LAB RESULTS: CBC  Lab 07/08/12 0615 07/07/12 0630 07/06/12 1531 07/03/12 1942 07/01/12 1501 07/01/12 1417  WBC 5.0 4.8 5.7 3.8* -- 6.7  HGB 12.2* 13.0 14.5 14.7 16.3 --  HCT 35.1* 36.2* 41.0 40.4 48.0 --  PLT 261 255 246 218 -- 245  MCV 86.9 87.0 87.0 87.1 -- 88.1  MCH 30.2 31.3 30.8 31.7 -- 31.8  MCHC 34.8 35.9 35.4 36.4* -- 36.1*  RDW 13.2 13.5 13.3 13.3 -- 13.1  LYMPHSABS -- -- 0.8 0.7 -- 2.3  MONOABS -- -- 0.8 0.2 -- 0.4  EOSABS -- -- 0.1 0.0 -- 0.2  BASOSABS -- -- 0.0 0.0 -- 0.0  BANDABS -- -- -- -- -- --    Chemistries   Lab 07/08/12 0615 07/07/12 0630 07/06/12 1531 07/03/12 1942 07/01/12 1501  NA 135 135 130* 131* 142  K 3.4* 3.8 3.5 3.3* 3.6  CL 101 104 98 97 106  CO2 27 22 21 22  --  GLUCOSE 90 97 110* 101* 91  BUN 11 14 14 17 11   CREATININE 1.02 1.03 1.08 1.41* 1.20  CALCIUM 8.3* 8.2* 8.5 8.7 --  MG -- -- -- -- --    CBG:  Lab 07/06/12 1448  GLUCAP 109*  GFR Estimated Creatinine Clearance: 130.8 ml/min (by C-G formula based on Cr of 1.02).  Coagulation profile  Lab 07/07/12 0630  INR 1.08  PROTIME --    MICROBIOLOGY: Recent Results (from the past 240 hour(s))  URINE CULTURE     Status: Normal   Collection Time   07/03/12  9:56 PM      Component Value Range Status Comment   Specimen Description URINE, CLEAN CATCH   Final    Special Requests NONE   Final    Culture  Setup Time 07/03/2012 22:27   Final    Colony Count NO GROWTH   Final    Culture NO GROWTH   Final    Report Status 07/04/2012 FINAL   Final   WOUND CULTURE     Status: Normal   Collection Time   07/03/12 10:07 PM      Component Value Range Status Comment   Specimen Description WOUND LEFT ABDOMEN   Final    Special Requests  Normal   Final    Gram Stain     Final    Value: FEW WBC PRESENT, PREDOMINANTLY PMN     NO SQUAMOUS EPITHELIAL CELLS SEEN     MODERATE GRAM POSITIVE COCCI IN PAIRS     IN CLUSTERS IN CHAINS   Culture     Final    Value: ABUNDANT STAPHYLOCOCCUS AUREUS     Note: RIFAMPIN AND GENTAMICIN SHOULD NOT BE USED AS SINGLE DRUGS FOR TREATMENT OF STAPH INFECTIONS. This organism DOES NOT demonstrate inducible Clindamycin resistance in vitro.   Report Status 07/06/2012 FINAL   Final    Organism ID, Bacteria STAPHYLOCOCCUS AUREUS   Final   CULTURE, BLOOD (ROUTINE X 2)     Status: Normal (Preliminary result)   Collection Time   07/06/12  5:19 PM      Component Value Range Status Comment   Specimen Description BLOOD LEFT ARM   Final    Special Requests BOTTLES DRAWN AEROBIC AND ANAEROBIC 10CC   Final    Culture  Setup Time 07/07/2012 12:24   Final    Culture     Final    Value:        BLOOD CULTURE RECEIVED NO GROWTH TO DATE CULTURE WILL BE HELD FOR 5 DAYS BEFORE ISSUING A FINAL NEGATIVE REPORT   Report Status PENDING   Incomplete   CULTURE, BLOOD (ROUTINE X 2)     Status: Normal (Preliminary result)   Collection Time   07/06/12  5:35 PM      Component Value Range Status Comment   Specimen Description BLOOD LEFT HAND   Final    Special Requests BOTTLES DRAWN AEROBIC ONLY 6CC   Final    Culture  Setup Time 07/07/2012 12:24   Final    Culture     Final    Value:        BLOOD CULTURE RECEIVED NO GROWTH TO DATE CULTURE WILL BE HELD FOR 5 DAYS BEFORE ISSUING A FINAL NEGATIVE REPORT   Report Status PENDING   Incomplete   CLOSTRIDIUM DIFFICILE BY PCR     Status: Normal   Collection Time   07/07/12  9:48 AM      Component Value Range Status Comment   C difficile by pcr NEGATIVE  NEGATIVE Final     RADIOLOGY STUDIES/RESULTS: Dg Chest 2 View  07/03/2012  *RADIOLOGY REPORT*  Clinical Data: Chest pain and shortness of breath; fever  CHEST - 2 VIEW  Comparison: None.  Findings: There is minimal scarring in  the left lower lobe.  The lungs are otherwise clear.  Heart size and pulmonary vascularity are normal.  No adenopathy.  No bone lesions.  IMPRESSION: No edema or consolidation.   Original Report Authenticated By: Bretta Bang, M.D.    Mr Lumbar Spine W Wo Contrast  07/06/2012  *RADIOLOGY REPORT*  Clinical Data: Paraplegia.  Recent fevers and abdominal wall abscess.  Vomiting and diarrhea.  Lethargy and weakness. Somnolence.  MRI LUMBAR SPINE WITHOUT AND WITH CONTRAST  Technique:  Multiplanar and multiecho pulse sequences of the lumbar spine were obtained without and with intravenous contrast.  Contrast:  20 ml Multihance  Comparison: 07/04/2012  Findings: The lowest full intervertebral disk space is labeled L5- S1.  If procedural intervention is to be performed, careful correlation with this numbering strategy is recommended.  The conus medullaris appears unremarkable.  Conus level:  L1.  No significant vertebral subluxation.  Inversion recovery weighted images demonstrate no significant abnormal vertebral or periligamentous edema.  No findings of diskitis-osteomyelitis or epidural abscess.  No significant abnormal enhancement is observed in the epidural space. No clumping of nerve roots or abnormal nerve root enhancement to favor arachnoiditis.  The psoas and paraspinal musculature appears unremarkable.  Epidural adipose tissue is sharply defined in the lumbar spine, without findings of lumbar epidural hematoma or compression of the conus or nerve roots.  Additional findings at individual levels are as follows:  T12-L1:  Unremarkable.  L1-2:  Unremarkable.  L2-3:  Unremarkable.  L3-4:  Unremarkable.  L4-5:  Unremarkable.  L5-S1:  Unremarkable.  IMPRESSION:  1.  No abnormality in the lumbar spine to account the patient's lower extremity weakness and hyperreflexia.  We imaged the mid T11 level down to the S2-3 level on today's examination.  If neurologic exam findings raise the possibility of the thoracic  spine lesion or higher lesion, then additional spinal imaging may be indicated.   Original Report Authenticated By: Gaylyn Rong, M.D.    Ct Abdomen Pelvis W Contrast  07/04/2012  *RADIOLOGY REPORT*  Clinical Data: Abdominal pain, body aches, nausea, diarrhea and vomiting.  Fever.  Recent I&D of abdominal wall abscess.  Assess for residual abscess.  CT ABDOMEN AND PELVIS WITH CONTRAST  Technique:  Multidetector CT imaging of the abdomen and pelvis was performed following the standard protocol during bolus administration of intravenous contrast.  Contrast: OMNIPAQUE IOHEXOL 300 MG/ML  SOLN  Comparison: Abdominal ultrasound performed 03/06/2008  Findings: Minimal bibasilar atelectasis is noted.  At the left mid abdomen, there is a small focal soft tissue wound, with associated skin thickening and underlying soft tissue inflammation.  No associated fluid collection is identified; this likely reflects the recently drained abdominal wall abscess.  There is no evidence of residual or recurrent abscess at this location. Soft tissue inflammation remains relatively superficial, extending approximately 2.3 cm deep to the skin surface.  The liver and spleen are unremarkable in appearance.  The patient is status post cholecystectomy, with clips noted at the gallbladder fossa.  The pancreas and adrenal glands are unremarkable.  A 5 mm stone is noted at the interpole region of the left kidney; a 9 mm hypodensity near the lower pole of the right kidney likely reflects a cyst.  There is no evidence of hydronephrosis.  No obstructing ureteral stones are seen.  No perinephric stranding is appreciated.  No free fluid is identified.  The small bowel is unremarkable in appearance.  The stomach is within normal limits.  No acute vascular abnormalities are seen.  The appendix is normal in caliber, without evidence for appendicitis.  The colon is unremarkable in appearance.  The bladder is mildly distended and grossly  unremarkable in appearance.  The prostate is normal in size.  A small left inguinal hernia is noted, containing only fat.  No inguinal lymphadenopathy is seen.  No acute osseous abnormalities are identified.  IMPRESSION:  1.  No evidence of residual or recurrent abscess; small focal soft tissue wound at the left mid abdomen demonstrates skin thickening and underlying soft tissue inflammation.  It remains relatively superficial, extending up to 2.3 cm deep to the skin surface. 2.  5 mm stone at the interpole region of the left kidney; small right renal cyst seen. 3.  Small left inguinal hernia, containing only fat.   Original Report Authenticated By: Tonia Ghent, M.D.     MEDICATIONS: Scheduled Meds:    .  ceFAZolin (ANCEF) IV  1 g Intravenous Q8H  . enoxaparin (LOVENOX) injection  40 mg Subcutaneous Q24H   Continuous Infusions:    . sodium chloride 100 mL/hr at 07/08/12 0855   PRN Meds:.acetaminophen, acetaminophen, alum & mag hydroxide-simeth, morphine injection, ondansetron (ZOFRAN) IV, ondansetron, oxyCODONE  Antibiotics: Anti-infectives     Start     Dose/Rate Route Frequency Ordered Stop   07/06/12 1700   ceFAZolin (ANCEF) IVPB 1 g/50 mL premix        1 g 100 mL/hr over 30 Minutes Intravenous 3 times per day 07/06/12 1653            Conley Canal [facility] Pager:[phone]  If 7PM-7AM, please contact night-coverage [email or web address] Password [email or web address] 07/08/2012, 9:57 AM   LOS: 2 days    Attending -Better, ambulating independently. Suspect bacteremia, however blood cultures are negative (was already on antibitocs when they were drawn). Await ID eval-re discaharge antibiotic regimen. Agree with assessment and plan as outlined above.  S Jahiem Franzoni

## 2012-07-08 NOTE — Evaluation (Signed)
Physical Therapy Evaluation Patient Details Name: Anthony Delgado MRN: [id number] DOB: 1975-12-23 Today's Date: 07/08/2012 Time: 2440-1027 PT Time Calculation (min): 12 min  PT Assessment / Plan / Recommendation Clinical Impression  Pt adm with cellulitis and abscess and SIRS.  Pt much better today and mobillizing well.  No further PT needed.    PT Assessment  Patent does not need any further PT services    Follow Up Recommendations  No PT follow up    Does the patient have the potential to tolerate intense rehabilitation      Barriers to Discharge        Equipment Recommendations  None recommended by PT    Recommendations for Other Services     Frequency      Precautions / Restrictions Precautions Precautions: None Restrictions Weight Bearing Restrictions: No   Pertinent Vitals/Pain N/A      Mobility  Bed Mobility Bed Mobility: Supine to Sit;Sitting - Scoot to Edge of Bed Supine to Sit: 7: Independent Sitting - Scoot to Edge of Bed: 7: Independent Transfers Transfers: Sit to Stand;Stand to Sit Sit to Stand: 7: Independent;With upper extremity assist;From bed Stand to Sit: 7: Independent;With upper extremity assist;With armrests;To chair/3-in-1 Ambulation/Gait Ambulation/Gait Assistance: 7: Independent Ambulation Distance (Feet): 300 Feet Assistive device: None Gait Pattern: Within Functional Limits    Shoulder Instructions     Exercises     PT Diagnosis:    PT Problem List:   PT Treatment Interventions:     PT Goals    Visit Information  Last PT Received On: 07/08/12 Assistance Needed: +1    Subjective Data  Subjective: Pt states he feels better. Patient Stated Goal: Return home   Prior Functioning  Home Living Lives With: Spouse Home Adaptive Equipment: None Prior Function Level of Independence: Independent Able to Take Stairs?: Yes Driving: Yes Vocation: Full time employment Communication Communication: No difficulties     Cognition  Overall Cognitive Status: Appears within functional limits for tasks assessed/performed Arousal/Alertness: Awake/alert Orientation Level: Appears intact for tasks assessed Behavior During Session: [facility] for tasks performed    Extremity/Trunk Assessment Right Upper Extremity Assessment RUE ROM/Strength/Tone: Within functional levels Left Upper Extremity Assessment LUE ROM/Strength/Tone: Within functional levels Right Lower Extremity Assessment RLE ROM/Strength/Tone: Within functional levels Left Lower Extremity Assessment LLE ROM/Strength/Tone: Within functional levels   Balance Static Standing Balance Static Standing - Balance Support: No upper extremity supported;During functional activity Static Standing - Level of Assistance: 7: Independent  End of Session PT - End of Session Activity Tolerance: Patient tolerated treatment well Patient left: in chair;with call bell/phone within reach Nurse Communication: Mobility status  GP     [facility] 07/08/2012, 10:45 AM  Skip Mayer PT [phone]

## 2012-07-09 MED ORDER — OXYCODONE HCL 5 MG PO TABS: 5.0000 mg | ORAL_TABLET | Freq: Four times a day (QID) | ORAL | Status: DC | PRN

## 2012-07-09 MED ORDER — ACETAMINOPHEN-CODEINE #3 300-30 MG PO TABS: 1.0000 | ORAL_TABLET | Freq: Four times a day (QID) | ORAL | Status: DC | PRN

## 2012-07-09 MED ORDER — ONDANSETRON HCL 4 MG PO TABS: 4.0000 mg | ORAL_TABLET | Freq: Three times a day (TID) | ORAL | Status: DC | PRN

## 2012-07-09 MED ORDER — CEPHALEXIN 500 MG PO CAPS: 500.0000 mg | ORAL_CAPSULE | Freq: Four times a day (QID) | ORAL | Status: DC

## 2012-07-09 NOTE — Progress Notes (Signed)
Discharge instruction reviewed with patient and wife verbalize and understand . Prescriptions given to patient. Skin WNL. Left floor ambulating with wife.

## 2012-07-09 NOTE — ED Provider Notes (Signed)
Medical screening examination/treatment/procedure(s) were performed by non-physician practitioner and as supervising physician I was immediately available for consultation/collaboration.  Flint Melter, MD 07/09/12 [phone]

## 2012-07-09 NOTE — Care Management Note (Signed)
    Page 1 of 1   07/09/2012     11:02:51 AM   CARE MANAGEMENT NOTE 07/09/2012  Patient:  Anthony Delgado, Anthony Delgado   Account Number:  [id number]  Date Initiated:  07/09/2012  Documentation initiated by:  Letha Cape  Subjective/Objective Assessment:   dx cellulitis and abscess of trunk  admit- lives with spouse,pta independent.     Action/Plan:   Anticipated DC Date:  07/09/2012   Anticipated DC Plan:  HOME/SELF CARE      DC Planning Services  CM consult      Choice offered to / List presented to:             Status of service:  Completed, signed off Medicare Important Message given?   (If response is "NO", the following Medicare IM given date fields will be blank) Date Medicare IM given:   Date Additional Medicare IM given:    Discharge Disposition:  HOME/SELF CARE  Per UR Regulation:  Reviewed for med. necessity/level of care/duration of stay  If discussed at Long Length of Stay Meetings, dates discussed:    Comments:  07/09/12 11:00 Letha Cape RN, BSN [phone] patient lives with spouse at home, pta independent. Patient states he can afford his medications, he works full time.  He has transportation at discharge.  Made patient a follow up appt with Jovita Kussmaul.  Patient is eligible for med ast if needed.

## 2012-07-09 NOTE — Progress Notes (Signed)
Retro ur ins review. 

## 2012-07-09 NOTE — Progress Notes (Signed)
    [facility]    Date of Admission:  07/06/2012   Total days of antibiotics 4        Day 4 cefazolin           ID: Clevland Cork is a 36 y.o. male with MSSA abdominal wall abscess s/p drainage who presents with gastroenteritis and improved transaminitis Principal Problem:  *Cellulitis and abscess of trunk Active Problems:  Staph aureus infection  Abnormal LFTs  Hyponatremia  Paraplegia  Encephalopathy acute  Nausea & vomiting  Diarrhea    Subjective: Afebrile. Still having some nausea. No vomiting  Medications:     .  ceFAZolin (ANCEF) IV  1 g Intravenous Q8H  . enoxaparin (LOVENOX) injection  40 mg Subcutaneous Q24H    Objective: Vital signs in last 24 hours: Temp:  [98.4 F (36.9 C)-99 F (37.2 C)] 98.7 F (37.1 C) (11/12 0509) Pulse Rate:  [69-82] 82  (11/12 0509) Resp:  [18] 18  (11/12 0509) BP: (125-141)/(70-84) 126/70 mmHg (11/12 0509) SpO2:  [97 %-99 %] 99 % (11/12 0509) Physical Exam  Constitutional: He is oriented to person, place, and time. He appears well-developed and well-nourished. No distress.  HENT:  Mouth/Throat: Oropharynx is clear and moist. No oropharyngeal exudate.  Cardiovascular: Normal rate, regular rhythm and normal heart sounds. Exam reveals no gallop and no friction rub.  No murmur heard.  Pulmonary/Chest: Effort normal and breath sounds normal. No respiratory distress. He has no wheezes.  Abdominal: Soft. Bowel sounds are normal. He exhibits no distension. There is no tenderness.  Lymphadenopathy:  He has no cervical adenopathy.  Neurological: He is alert and oriented to person, place, and time.  Skin: quarter sized shallow ulcer clean wound bed from i x d site on left lower abdominal quadrant Psychiatric: He has a normal mood and affect. His behavior is normal.    Lab Results  [facility] 07/08/12 0615 07/07/12 0630  WBC 5.0 4.8  HGB 12.2* 13.0  HCT 35.1* 36.2*  NA 135 135  K 3.4* 3.8  CL 101 104    CO2 27 22  BUN 11 14  CREATININE 1.02 1.03  GLU -- --   Liver Panel  Basename 07/08/12 0615 07/07/12 0630  PROT 6.3 6.5  ALBUMIN 2.4* 2.4*  AST 89* 156*  ALT 176* 239*  ALKPHOS 142* 140*  BILITOT 0.4 0.5  BILIDIR -- --  IBILI -- --   Sedimentation Rate  Basename 07/06/12 1730  ESRSEDRATE 38*    Microbiology: 11/6 wound culture MSSA Studies/Results: No results found.  Assessment/Plan: MSSA abdominal wall abscess/cellulitis = would discharge on him on keflex 500mg  QID until 07/16/2012.  Gastroenteritis = appears improved but would still benefit from anti-emetics  Will sign off   [facility], [facility] Cell: [phone] Pager: [phone]  07/09/2012, 12:21 PM

## 2012-07-09 NOTE — Discharge Summary (Signed)
PATIENT DETAILS Name: Anthony Delgado Age: 36 y.o. Sex: male Date of Birth: [birth year] MRN: [id number]. Admit Date: 07/06/2012 Admitting Physician: Lorane Gell, MD PCP:Pcp Not In System  Recommendations for Outpatient Follow-up:  1. Please check a LFT at next visti 2. Will need general health maintenance check at next visit  PRIMARY DISCHARGE DIAGNOSIS:  Principal Problem:  *Cellulitis and abscess of trunk Active Problems:  Staph aureus infection  Abnormal LFTs  Hyponatremia  Encephalopathy acute  Nausea & vomiting  Diarrhea      PAST MEDICAL HISTORY: History reviewed. No pertinent past medical history.  DISCHARGE MEDICATIONS:   Medication List     As of 07/09/2012 10:21 AM    STOP taking these medications         acetaminophen-codeine 300-30 MG per tablet   Commonly known as: TYLENOL #3      aspirin 325 MG tablet      sulfamethoxazole-trimethoprim 800-160 MG per tablet   Commonly known as: BACTRIM DS,SEPTRA DS      TAKE these medications         cephALEXin 500 MG capsule   Commonly known as: KEFLEX   Take 1 capsule (500 mg total) by mouth 4 (four) times daily.      ondansetron 4 MG tablet   Commonly known as: ZOFRAN   Take 1 tablet (4 mg total) by mouth every 8 (eight) hours as needed for nausea.      oxyCODONE 5 MG immediate release tablet   Commonly known as: Oxy IR/ROXICODONE   Take 1 tablet (5 mg total) by mouth every 6 (six) hours as needed for pain.          BRIEF HPI:  See H&P, Labs, Consult and Test reports for all details in brief,Anthony Delgado is a 36 y.o. male with out any significant past medical history who presented to the emergency room today for fever. This was the third presentation to the emergency room in the past 5 days. He presented initially on November 4 with a left abdominal wall abscess. At that time he had no systemic symptoms. When he presented the abscess was already in place for 72 hours. He underwent incision and drainage  was placed on bactrim and discharged home. Patient presented back to the emergency room on November 7 with complaints of redness at the abdominal incision site. He also was complaining of abdominal pain. By then the patient also had temperature of up to 101.2. A CT scan of the abdomen was obtained on November 7 without any acute abdominal pathology identified. The patient was kept in CDU was given IV clindamycin. He was switched to oral clindamycin and discharged home. His wife brought him back on November 9 after he has been having nausea vomiting and diarrhea. She has also noticed that he is now lethargic and extremely weak. 3 people had to carry him from the car to the stretcher in the emergency room.    CONSULTATIONS:  ID  PERTINENT RADIOLOGIC STUDIES: Dg Chest 2 View  07/03/2012  *RADIOLOGY REPORT*  Clinical Data: Chest pain and shortness of breath; fever  CHEST - 2 VIEW  Comparison: None.  Findings: There is minimal scarring in the left lower lobe.  The lungs are otherwise clear.  Heart size and pulmonary vascularity are normal.  No adenopathy.  No bone lesions.  IMPRESSION: No edema or consolidation.   Original Report Authenticated By: Bretta Bang, M.D.    Mr Lumbar Spine W Wo Contrast  07/06/2012  *RADIOLOGY  REPORT*  Clinical Data: Paraplegia.  Recent fevers and abdominal wall abscess.  Vomiting and diarrhea.  Lethargy and weakness. Somnolence.  MRI LUMBAR SPINE WITHOUT AND WITH CONTRAST  Technique:  Multiplanar and multiecho pulse sequences of the lumbar spine were obtained without and with intravenous contrast.  Contrast:  20 ml Multihance  Comparison: 07/04/2012  Findings: The lowest full intervertebral disk space is labeled L5- S1.  If procedural intervention is to be performed, careful correlation with this numbering strategy is recommended.  The conus medullaris appears unremarkable.  Conus level:  L1.  No significant vertebral subluxation.  Inversion recovery weighted images  demonstrate no significant abnormal vertebral or periligamentous edema.  No findings of diskitis-osteomyelitis or epidural abscess.  No significant abnormal enhancement is observed in the epidural space. No clumping of nerve roots or abnormal nerve root enhancement to favor arachnoiditis.  The psoas and paraspinal musculature appears unremarkable.  Epidural adipose tissue is sharply defined in the lumbar spine, without findings of lumbar epidural hematoma or compression of the conus or nerve roots.  Additional findings at individual levels are as follows:  T12-L1:  Unremarkable.  L1-2:  Unremarkable.  L2-3:  Unremarkable.  L3-4:  Unremarkable.  L4-5:  Unremarkable.  L5-S1:  Unremarkable.  IMPRESSION:  1.  No abnormality in the lumbar spine to account the patient's lower extremity weakness and hyperreflexia.  We imaged the mid T11 level down to the S2-3 level on today's examination.  If neurologic exam findings raise the possibility of the thoracic spine lesion or higher lesion, then additional spinal imaging may be indicated.   Original Report Authenticated By: Gaylyn Rong, M.D.    Ct Abdomen Pelvis W Contrast  07/04/2012  *RADIOLOGY REPORT*  Clinical Data: Abdominal pain, body aches, nausea, diarrhea and vomiting.  Fever.  Recent I&D of abdominal wall abscess.  Assess for residual abscess.  CT ABDOMEN AND PELVIS WITH CONTRAST  Technique:  Multidetector CT imaging of the abdomen and pelvis was performed following the standard protocol during bolus administration of intravenous contrast.  Contrast: OMNIPAQUE IOHEXOL 300 MG/ML  SOLN  Comparison: Abdominal ultrasound performed 03/06/2008  Findings: Minimal bibasilar atelectasis is noted.  At the left mid abdomen, there is a small focal soft tissue wound, with associated skin thickening and underlying soft tissue inflammation.  No associated fluid collection is identified; this likely reflects the recently drained abdominal wall abscess.  There is no  evidence of residual or recurrent abscess at this location. Soft tissue inflammation remains relatively superficial, extending approximately 2.3 cm deep to the skin surface.  The liver and spleen are unremarkable in appearance.  The patient is status post cholecystectomy, with clips noted at the gallbladder fossa.  The pancreas and adrenal glands are unremarkable.  A 5 mm stone is noted at the interpole region of the left kidney; a 9 mm hypodensity near the lower pole of the right kidney likely reflects a cyst.  There is no evidence of hydronephrosis.  No obstructing ureteral stones are seen.  No perinephric stranding is appreciated.  No free fluid is identified.  The small bowel is unremarkable in appearance.  The stomach is within normal limits.  No acute vascular abnormalities are seen.  The appendix is normal in caliber, without evidence for appendicitis.  The colon is unremarkable in appearance.  The bladder is mildly distended and grossly unremarkable in appearance.  The prostate is normal in size.  A small left inguinal hernia is noted, containing only fat.  No inguinal lymphadenopathy is  seen.  No acute osseous abnormalities are identified.  IMPRESSION:  1.  No evidence of residual or recurrent abscess; small focal soft tissue wound at the left mid abdomen demonstrates skin thickening and underlying soft tissue inflammation.  It remains relatively superficial, extending up to 2.3 cm deep to the skin surface. 2.  5 mm stone at the interpole region of the left kidney; small right renal cyst seen. 3.  Small left inguinal hernia, containing only fat.   Original Report Authenticated By: Tonia Ghent, M.D.      PERTINENT LAB RESULTS: CBC:  Basename 07/08/12 0615 07/07/12 0630  WBC 5.0 4.8  HGB 12.2* 13.0  HCT 35.1* 36.2*  PLT 261 255   CMET CMP     Component Value Date/Time   NA 135 07/08/2012 0615   K 3.4* 07/08/2012 0615   CL 101 07/08/2012 0615   CO2 27 07/08/2012 0615   GLUCOSE 90  07/08/2012 0615   BUN 11 07/08/2012 0615   CREATININE 1.02 07/08/2012 0615   CALCIUM 8.3* 07/08/2012 0615   PROT 6.3 07/08/2012 0615   ALBUMIN 2.4* 07/08/2012 0615   AST 89* 07/08/2012 0615   ALT 176* 07/08/2012 0615   ALKPHOS 142* 07/08/2012 0615   BILITOT 0.4 07/08/2012 0615   GFRNONAA >90 07/08/2012 0615   GFRAA >90 07/08/2012 0615    GFR Estimated Creatinine Clearance: 130.8 ml/min (by C-G formula based on Cr of 1.02). No results found for this basename: LIPASE:2,AMYLASE:2 in the last 72 hours No results found for this basename: CKTOTAL:3,CKMB:3,CKMBINDEX:3,TROPONINI:3 in the last 72 hours No components found with this basename: POCBNP:3 No results found for this basename: DDIMER:2 in the last 72 hours No results found for this basename: HGBA1C:2 in the last 72 hours No results found for this basename: CHOL:2,HDL:2,LDLCALC:2,TRIG:2,CHOLHDL:2,LDLDIRECT:2 in the last 72 hours No results found for this basename: TSH,T4TOTAL,FREET3,T3FREE,THYROIDAB in the last 72 hours No results found for this basename: VITAMINB12:2,FOLATE:2,FERRITIN:2,TIBC:2,IRON:2,RETICCTPCT:2 in the last 72 hours Coags:  Basename 07/07/12 0630  INR 1.08   Microbiology: Recent Results (from the past 240 hour(s))  URINE CULTURE     Status: Normal   Collection Time   07/03/12  9:56 PM      Component Value Range Status Comment   Specimen Description URINE, CLEAN CATCH   Final    Special Requests NONE   Final    Culture  Setup Time 07/03/2012 22:27   Final    Colony Count NO GROWTH   Final    Culture NO GROWTH   Final    Report Status 07/04/2012 FINAL   Final   WOUND CULTURE     Status: Normal   Collection Time   07/03/12 10:07 PM      Component Value Range Status Comment   Specimen Description WOUND LEFT ABDOMEN   Final    Special Requests Normal   Final    Gram Stain     Final    Value: FEW WBC PRESENT, PREDOMINANTLY PMN     NO SQUAMOUS EPITHELIAL CELLS SEEN     MODERATE GRAM POSITIVE COCCI IN PAIRS       IN CLUSTERS IN CHAINS   Culture     Final    Value: ABUNDANT STAPHYLOCOCCUS AUREUS     Note: RIFAMPIN AND GENTAMICIN SHOULD NOT BE USED AS SINGLE DRUGS FOR TREATMENT OF STAPH INFECTIONS. This organism DOES NOT demonstrate inducible Clindamycin resistance in vitro.   Report Status 07/06/2012 FINAL   Final    Organism ID, Bacteria STAPHYLOCOCCUS  AUREUS   Final   CULTURE, BLOOD (ROUTINE X 2)     Status: Normal (Preliminary result)   Collection Time   07/06/12  5:19 PM      Component Value Range Status Comment   Specimen Description BLOOD LEFT ARM   Final    Special Requests BOTTLES DRAWN AEROBIC AND ANAEROBIC 10CC   Final    Culture  Setup Time 07/07/2012 12:24   Final    Culture     Final    Value:        BLOOD CULTURE RECEIVED NO GROWTH TO DATE CULTURE WILL BE HELD FOR 5 DAYS BEFORE ISSUING A FINAL NEGATIVE REPORT   Report Status PENDING   Incomplete   CULTURE, BLOOD (ROUTINE X 2)     Status: Normal (Preliminary result)   Collection Time   07/06/12  5:35 PM      Component Value Range Status Comment   Specimen Description BLOOD LEFT HAND   Final    Special Requests BOTTLES DRAWN AEROBIC ONLY 6CC   Final    Culture  Setup Time 07/07/2012 12:24   Final    Culture     Final    Value:        BLOOD CULTURE RECEIVED NO GROWTH TO DATE CULTURE WILL BE HELD FOR 5 DAYS BEFORE ISSUING A FINAL NEGATIVE REPORT   Report Status PENDING   Incomplete   CLOSTRIDIUM DIFFICILE BY PCR     Status: Normal   Collection Time   07/07/12  9:48 AM      Component Value Range Status Comment   C difficile by pcr NEGATIVE  NEGATIVE Final      BRIEF HOSPITAL COURSE:   Principal Problem: *Cellulitis and abscess of trunk -Patient was admitted, since he had wound cultures done on 11/16 he was started on intravenous Ancef. He made great clinical improvement. His abdominal wound looks very trivial, there is no active discharge and the wound is mostly dry. He did have a I&D done in the ED a few days prior to  admission. On discharge she will be transitioned to Keflex. Please see below for further details  SIR's  -resolved.  -2/2 to above  AMS  -resolved-now awake and alert  -2/2 Toxic Metabolic Encephalopathy -do not think that a small abscess can cause this much of symptoms-likely bacteremia, however his blood cultures are negative to date. Unfortunately the patient was already on antibiotics for a few days prior to admission. Given the fact that he had such profound symptoms on admission, and there was high suspicion for bacteremia infectious disease was consulted. They at this time recommend discharging this patient on Keflex. Blood cultures continued to be negative on the day of discharge.   Gen weakness-with worse in B/L lower extremities -This is likely secondary to above -On admission he was found to have profound weakness of his lower extremities, a MRI of the lumbosacral spine was done urgently which did not show any significant pathology. With treatment with antibiotics and fluids, patient improved on his own and is now actually ambulating in the hallway. He is almost back to his usual baseline strength.  Transaminitis  -likely secondary to SIR's/sepsis  -improving.  -hepatitis panel negative -Patient will need a liver function panel checked as an outpatient to make sure it has normalized.   Nausea vomiting and diarrhea  -resolved  - Cdiff PCR-negative     TODAY-DAY OF DISCHARGE:  Subjective:   Taurin Guilfoyle today has no headache,no chest  abdominal pain,no new weakness tingling or numbness, feels much better wants to go home today.  Objective:   Blood pressure 126/70, pulse 82, temperature 98.7 F (37.1 C), temperature source Oral, resp. rate 18, height 6' (1.829 m), weight 119.2 kg (262 lb 12.6 oz), SpO2 99.00%.  Intake/Output Summary (Last 24 hours) at 07/09/12 1021 Last data filed at 07/09/12 0900  Gross per 24 hour  Intake 2101.67 ml  Output   1000 ml  Net 1101.67  ml    Exam Awake Alert, Oriented *3, No new F.N deficits, Normal affect [place].AT,PERRAL Supple Neck,No JVD, No cervical lymphadenopathy appriciated.  Symmetrical Chest wall movement, Good air movement bilaterally, CTAB RRR,No Gallops,Rubs or new Murmurs, No Parasternal Heave +ve B.Sounds, Abd Soft, Non tender, No organomegaly appriciated, No rebound -guarding or rigidity. No Cyanosis, Clubbing or edema, No new Rash or bruise  DISCHARGE CONDITION: Stable  DISPOSITION:  HOME  DISCHARGE INSTRUCTIONS:    Activity:  As tolerated   Diet recommendation: Regular Diet  Follow-up Information    Follow up with Pcp Not In System.       Total Time spent on discharge equals 45 minutes.  SignedJeoffrey Massed 07/09/2012 10:21 AM

## 2012-07-13 LAB — CULTURE, BLOOD (ROUTINE X 2): Culture: NO GROWTH

## 2012-07-22 NOTE — ED Provider Notes (Signed)
Medical screening examination/treatment/procedure(s) were performed by non-physician practitioner and as supervising physician I was immediately available for consultation/collaboration.   Suzi Roots, MD 07/22/12 [phone]

## 2013-04-02 ENCOUNTER — Encounter (HOSPITAL_COMMUNITY): Payer: Self-pay | Admitting: Emergency Medicine

## 2013-04-02 ENCOUNTER — Inpatient Hospital Stay (HOSPITAL_COMMUNITY)
Admission: EM | Admit: 2013-04-02 | Discharge: 2013-04-05 | DRG: 069 | Disposition: A | Discharge status: Home / Self Care | Payer: MEDICAID | Attending: Internal Medicine | Admitting: Internal Medicine

## 2013-04-02 ENCOUNTER — Emergency Department (HOSPITAL_COMMUNITY): Payer: Self-pay

## 2013-04-02 DIAGNOSIS — R945 Abnormal results of liver function studies: Secondary | ICD-10-CM

## 2013-04-02 DIAGNOSIS — E860 Dehydration: Secondary | ICD-10-CM | POA: Diagnosis present

## 2013-04-02 DIAGNOSIS — N289 Disorder of kidney and ureter, unspecified: Secondary | ICD-10-CM

## 2013-04-02 DIAGNOSIS — K7689 Other specified diseases of liver: Secondary | ICD-10-CM | POA: Diagnosis present

## 2013-04-02 DIAGNOSIS — R0789 Other chest pain: Secondary | ICD-10-CM | POA: Diagnosis present

## 2013-04-02 DIAGNOSIS — G8190 Hemiplegia, unspecified affecting unspecified side: Secondary | ICD-10-CM | POA: Diagnosis present

## 2013-04-02 DIAGNOSIS — G8194 Hemiplegia, unspecified affecting left nondominant side: Secondary | ICD-10-CM

## 2013-04-02 DIAGNOSIS — G459 Transient cerebral ischemic attack, unspecified: Secondary | ICD-10-CM

## 2013-04-02 DIAGNOSIS — E785 Hyperlipidemia, unspecified: Secondary | ICD-10-CM

## 2013-04-02 DIAGNOSIS — R079 Chest pain, unspecified: Secondary | ICD-10-CM

## 2013-04-02 DIAGNOSIS — R209 Unspecified disturbances of skin sensation: Secondary | ICD-10-CM

## 2013-04-02 HISTORY — DX: Transient cerebral ischemic attack, unspecified: G45.9

## 2013-04-02 LAB — CBC
HCT: 43.4 % (ref 39.0–52.0)
Hemoglobin: 15.8 g/dL (ref 13.0–17.0)
MCHC: 36.4 g/dL — ABNORMAL HIGH (ref 30.0–36.0)
WBC: 6.6 10*3/uL (ref 4.0–10.5)

## 2013-04-02 LAB — DIFFERENTIAL
Lymphocytes Relative: 35 % (ref 12–46)
Monocytes Absolute: 0.6 10*3/uL (ref 0.1–1.0)
Monocytes Relative: 9 % (ref 3–12)
Neutro Abs: 3.6 10*3/uL (ref 1.7–7.7)

## 2013-04-02 LAB — APTT: aPTT: 25 seconds (ref 24–37)

## 2013-04-02 LAB — COMPREHENSIVE METABOLIC PANEL
BUN: 12 mg/dL (ref 6–23)
CO2: 24 mEq/L (ref 19–32)
Chloride: 106 mEq/L (ref 96–112)
Creatinine, Ser: 1.42 mg/dL — ABNORMAL HIGH (ref 0.50–1.35)
GFR calc non Af Amer: 61 mL/min — ABNORMAL LOW (ref >90)
Total Bilirubin: 0.3 mg/dL (ref 0.3–1.2)

## 2013-04-02 LAB — TROPONIN I: Troponin I: <0.3 ng/mL (ref <0.30)

## 2013-04-02 LAB — POCT I-STAT, CHEM 8
Creatinine, Ser: 1.5 mg/dL — ABNORMAL HIGH (ref 0.50–1.35)
Hemoglobin: 16 g/dL (ref 13.0–17.0)
Sodium: 145 mEq/L (ref 135–145)
TCO2: 24 mmol/L (ref 0–100)

## 2013-04-02 LAB — GLUCOSE, CAPILLARY: Glucose-Capillary: 86 mg/dL (ref 70–99)

## 2013-04-02 NOTE — Code Documentation (Signed)
37 yo wm brought in via [place] vehicle for CP, Dizziness, & Lt weakness.  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  Pt states he felt he has been drinking enough water.  Pt developed left CP that radiated to the Left arm around 1930.  The CP was accompanied by dizziness & LLE pain as well as LUE tingling.  Pt's CP is reproducible.  Code stroke called 2049, pt arrival 2045, LKW 1930, EDP exam 2049, stroke team arrival 2051, neurologist arrival 2053, pt arrival in CT 2050, phlebotomist arrival 2053.  Pt is not a candidate for acute treatment due to mild to improving symptoms.  Frequent VS

## 2013-04-02 NOTE — ED Notes (Signed)
Patient states that his left hand and left leg are "tingling".  Moving fingers well

## 2013-04-02 NOTE — ED Notes (Signed)
Pt arrived via POV with c/o of sudden onset of L sided weakness and numbness at 1930 while at football practice. Code Stroke activated and pt straight to bridge for EDP assessment.

## 2013-04-02 NOTE — ED Notes (Signed)
Patient was at football practice and became weak on his left side.  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 He became weak on his left side and dizzy.  Also states that his eyes were blurry (was blinking a lot)

## 2013-04-02 NOTE — ED Notes (Signed)
Patient c/o of discomfort when left chest palpated.

## 2013-04-02 NOTE — Consult Note (Signed)
Referring Physician: Micheline Maze    Chief Complaint: Weakness and numbness on left side.  HPI: Anthony Delgado is an 37 y.o. male with no known medical disease presenting with new onset left-sided weakness involving arm and leg, with onset at 7:30 PM today. Patient has no previous history of stroke nor TIA. He has not been on antiplatelet therapy. CT scan of his head showed no acute intracranial abnormality. NIH stroke score was 1 for persistent numbness. Patient was not deemed a candidate for TPA because of the mildness of his deficits.  LSN: 7:30 PM on 04/02/2013 tPA Given: No: Minimal deficits with sensory changes only MRankin: 1  History reviewed. No pertinent past medical history.  Family History  Problem Relation Age of Onset  . Cancer Mother      Medications: I have reviewed the patient's current medications.  ROS: History obtained from the patient  General ROS: negative for - chills, fatigue, fever, night sweats, weight gain or weight loss Psychological ROS: negative for - behavioral disorder, hallucinations, memory difficulties, mood swings or suicidal ideation Ophthalmic ROS: negative for - blurry vision, double vision, eye pain or loss of vision ENT ROS: negative for - epistaxis, nasal discharge, oral lesions, sore throat, tinnitus or vertigo Allergy and Immunology ROS: negative for - hives or itchy/watery eyes Hematological and Lymphatic ROS: negative for - bleeding problems, bruising or swollen lymph nodes Endocrine ROS: negative for - galactorrhea, hair pattern changes, polydipsia/polyuria or temperature intolerance Respiratory ROS: negative for - cough, hemoptysis, shortness of breath or wheezing Cardiovascular ROS: negative for - chest pain, dyspnea on exertion, edema or irregular heartbeat Gastrointestinal ROS: negative for - abdominal pain, diarrhea, hematemesis, nausea/vomiting or stool incontinence Genito-Urinary ROS: negative for - dysuria, hematuria, incontinence or  urinary frequency/urgency Musculoskeletal ROS: negative for - joint swelling or muscular weakness Neurological ROS: as noted in HPI Dermatological ROS: negative for rash and skin lesion changes  Physical Examination: Blood pressure 133/81, pulse 94, temperature 98.3 F (36.8 C), resp. rate 18, SpO2 100.00%.  Neurologic Examination: Mental Status: Alert, oriented, thought content appropriate.  Speech fluent without evidence of aphasia. Able to follow commands without difficulty. Cranial Nerves: II-Visual fields were normal. III/IV/VI-Pupils were equal and reacted. Extraocular movements were full and conjugate.    V/VII-slight left facial numbness; no facial weakness. VIII-normal. X-normal speech and symmetrical palatal movement. Motor: 5/5 bilaterally with normal tone and bulk Sensory: Reduced perception of tactile sensation over left extremities compared to right extremities. Deep Tendon Reflexes: 1+ and symmetric in upper extremities and absent in lower extremities. Plantars: Mute bilaterally Cerebellar: Normal finger-to-nose testing.   Ct Head (brain) Wo Contrast  04/02/2013   *RADIOLOGY REPORT*  Clinical Data: Left-sided weakness and chest pain  CT HEAD WITHOUT CONTRAST  Technique:  Contiguous axial images were obtained from the base of the skull through the vertex without contrast.  Comparison: None.  Findings: The bony calvarium is intact.  No gross soft tissue abnormality is seen.  No findings to suggest acute hemorrhage, acute infarction or space-occupying mass lesion are identified.  IMPRESSION: No acute abnormality noted.  These results were called by telephone on 04/02/2013 at 2100 hours to Dr. Roseanne Reno, who verbally acknowledged these results.   Original Report Authenticated By: Alcide Clever, M.D.    Assessment: 37 y.o. male presenting with possible transient ischemic attack with possible right subcortical MCA territory involvement.  Stroke Risk Factors - none  Plan: 1.  HgbA1c, fasting lipid panel 2. MRI, MRA  of the brain without contrast 3.  PT consult, OT consult, Speech consult 4. Echocardiogram 5. Carotid dopplers 6. Prophylactic therapy-Antiplatelet med: Aspirin 325 mg per day 7. Laboratory studies to rule out hypercoagulable state 8. Telemetry monitoring   C.R. Roseanne Reno, MD [facility] [phone] 04/02/2013, 9:58 PM

## 2013-04-02 NOTE — ED Provider Notes (Signed)
CSN: [id number]     Arrival date & time 04/02/13  2043 History     First MD Initiated Contact with Patient 04/02/13 2050     Chief Complaint  Patient presents with  . Code Stroke   (Consider location/radiation/quality/duration/timing/severity/associated sxs/prior Treatment) Patient is a 37 y.o. male presenting with extremity weakness. The history is provided by the patient.  Extremity Weakness This is a new problem. The current episode started today. The problem occurs constantly. The problem has been unchanged. Associated symptoms include chest pain, numbness (left-sided) and weakness (left-sided). Pertinent negatives include no abdominal pain, arthralgias, chills, coughing, diaphoresis, fatigue, fever, headaches, joint swelling, myalgias, nausea, neck pain, rash, urinary symptoms, vertigo, visual change or vomiting. Nothing aggravates the symptoms. He has tried nothing for the symptoms. The treatment provided no relief.    History reviewed. No pertinent past medical history. Past Surgical History  Procedure Laterality Date  . Cholecystectomy  2009   Family History  Problem Relation Age of Onset  . Cancer Mother    History  Substance Use Topics  . Smoking status: Never Smoker   . Smokeless tobacco: Not on file  . Alcohol Use: No    Review of Systems  Constitutional: Negative for fever, chills, diaphoresis, appetite change and fatigue.  HENT: Negative for neck pain and neck stiffness.   Eyes: Negative for photophobia and visual disturbance.  Respiratory: Negative for cough, chest tightness and wheezing.   Cardiovascular: Positive for chest pain. Negative for palpitations and leg swelling.  Gastrointestinal: Negative for nausea, vomiting, abdominal pain and abdominal distention.  Genitourinary: Negative for dysuria and difficulty urinating.  Musculoskeletal: Positive for gait problem and extremity weakness. Negative for myalgias, back pain, joint swelling and arthralgias.   Skin: Negative for color change, pallor and rash.  Neurological: Positive for weakness (left-sided), light-headedness and numbness (left-sided). Negative for vertigo, syncope, facial asymmetry, speech difficulty and headaches.  All other systems reviewed and are negative.    Allergies  Review of patient's allergies indicates no known allergies.  Home Medications   No current outpatient prescriptions on file. BP 121/75  Pulse 86  Temp(Src) 97.6 F (36.4 C) (Oral)  Resp 20  Ht 6' (1.829 m)  Wt 290 lb 1.6 oz (131.588 kg)  BMI 39.34 kg/m2  SpO2 98% Physical Exam  Nursing note and vitals reviewed. Constitutional: He is oriented to person, place, and time. He appears well-developed and well-nourished. No distress.  HENT:  Head: Normocephalic and atraumatic.  Eyes: EOM are normal. Pupils are equal, round, and reactive to light.  Neck: Normal range of motion. Neck supple.  Cardiovascular: Normal rate, regular rhythm, normal heart sounds and intact distal pulses.   Pulmonary/Chest: Effort normal and breath sounds normal. No respiratory distress. He has no wheezes. He has no rales.  Abdominal: Soft. Bowel sounds are normal. He exhibits no distension. There is no tenderness. There is no rebound and no guarding.  Musculoskeletal: Normal range of motion. He exhibits no edema and no tenderness.  Neurological: He is alert and oriented to person, place, and time. A sensory deficit is present. No cranial nerve deficit. He exhibits normal muscle tone. Gait (favoring left side) abnormal. Coordination normal. GCS eye subscore is 4. GCS verbal subscore is 5. GCS motor subscore is 6.  LUE and LLE:  3/5 strength compared to 5/5 on right side L-sided numbness No aphasia or facial droop  Skin: Skin is warm and dry. No rash noted. He is not diaphoretic.    ED Course  Procedures (including critical care time)  Labs Reviewed  CBC - Abnormal; Notable for the following:    MCHC 36.4 (*)    All  other components within normal limits  COMPREHENSIVE METABOLIC PANEL - Abnormal; Notable for the following:    Creatinine, Ser 1.42 (*)    Albumin 3.4 (*)    AST 46 (*)    GFR calc non Af Amer 61 (*)    GFR calc Af Amer 71 (*)    All other components within normal limits  POCT I-STAT, CHEM 8 - Abnormal; Notable for the following:    Creatinine, Ser 1.50 (*)    Calcium, Ion 1.26 (*)    All other components within normal limits  PROTIME-INR  APTT  DIFFERENTIAL  TROPONIN I  GLUCOSE, CAPILLARY  TROPONIN I  TROPONIN I  TROPONIN I  HEMOGLOBIN A1C  LIPID PANEL  POCT I-STAT TROPONIN I   Ct Head (brain) Wo Contrast  04/02/2013   *RADIOLOGY REPORT*  Clinical Data: Left-sided weakness and chest pain  CT HEAD WITHOUT CONTRAST  Technique:  Contiguous axial images were obtained from the base of the skull through the vertex without contrast.  Comparison: None.  Findings: The bony calvarium is intact.  No gross soft tissue abnormality is seen.  No findings to suggest acute hemorrhage, acute infarction or space-occupying mass lesion are identified.  IMPRESSION: No acute abnormality noted.  These results were called by telephone on 04/02/2013 at 2100 hours to Dr. Roseanne Reno, who verbally acknowledged these results.   Original Report Authenticated By: Alcide Clever, M.D.   1. TIA (transient ischemic attack)      Date: 04/02/2013  Rate: 101  Rhythm: sinus tachycardia  QRS Axis: normal  Intervals: normal  ST/T Wave abnormalities: nonspecific T wave changes  Conduction Disutrbances:none  Narrative Interpretation:   Old EKG Reviewed: unchanged   MDM  37 y.o. M with a PMH of DM presenting as code stroke.  Pt was at football practice when he felt sudden onset of L-sided weakness, numbness, and lightheadedness.  Pt reports associated chest pressure.  On arrival, pt AFVSS.  LUE and LLE weakness and numbness on exam.  No facial droop or aphasia.  Code stroke initiated, EKG, and labs obtained.  Neuro to  bedside.  CT head shows no acute abnormalities.  EKG as above, no acute ischemic changes.  FSBS WNL. Troponin negative.  Labs otherwise unremarkable.  Pt reporting improving weakness although still reports L-sided numbness.  Plan for admission for TIA protocol.  Pt stable at time of admission.  Discussed with attending Dr. Micheline Maze.  Jodean Lima, MD 04/03/13 [phone]

## 2013-04-03 ENCOUNTER — Encounter (HOSPITAL_COMMUNITY): Payer: Self-pay | Admitting: Internal Medicine

## 2013-04-03 ENCOUNTER — Inpatient Hospital Stay (HOSPITAL_COMMUNITY): Payer: MEDICAID

## 2013-04-03 DIAGNOSIS — G8194 Hemiplegia, unspecified affecting left nondominant side: Secondary | ICD-10-CM

## 2013-04-03 DIAGNOSIS — G8190 Hemiplegia, unspecified affecting unspecified side: Secondary | ICD-10-CM

## 2013-04-03 DIAGNOSIS — N289 Disorder of kidney and ureter, unspecified: Secondary | ICD-10-CM

## 2013-04-03 DIAGNOSIS — R079 Chest pain, unspecified: Secondary | ICD-10-CM

## 2013-04-03 DIAGNOSIS — E785 Hyperlipidemia, unspecified: Secondary | ICD-10-CM

## 2013-04-03 DIAGNOSIS — R209 Unspecified disturbances of skin sensation: Secondary | ICD-10-CM

## 2013-04-03 LAB — RAPID URINE DRUG SCREEN, HOSP PERFORMED
Amphetamines: NOT DETECTED
Barbiturates: NOT DETECTED
Benzodiazepines: NOT DETECTED
Cocaine: NOT DETECTED
Tetrahydrocannabinol: NOT DETECTED

## 2013-04-03 LAB — GLUCOSE, CAPILLARY
Glucose-Capillary: 114 mg/dL — ABNORMAL HIGH (ref 70–99)
Glucose-Capillary: 91 mg/dL (ref 70–99)
Glucose-Capillary: 116 mg/dL — ABNORMAL HIGH (ref 70–99)

## 2013-04-03 LAB — LIPID PANEL
HDL: 28 mg/dL — ABNORMAL LOW (ref >39)
LDL Cholesterol: 127 mg/dL — ABNORMAL HIGH (ref 0–99)
Total CHOL/HDL Ratio: 6.9 RATIO

## 2013-04-03 LAB — TROPONIN I: Troponin I: <0.3 ng/mL (ref <0.30)

## 2013-04-03 MED ORDER — HEPARIN SODIUM (PORCINE) 5000 UNIT/ML IJ SOLN
5000.0000 [IU] | Freq: Three times a day (TID) | INTRAMUSCULAR | Status: DC
  Administered 2013-04-03 – 2013-04-05 (×7): 5000 [IU] via SUBCUTANEOUS
  Filled 2013-04-03 (×10): qty 1

## 2013-04-03 MED ORDER — ASPIRIN 325 MG PO TABS
325.0000 mg | ORAL_TABLET | Freq: Every day | ORAL | Status: DC
  Administered 2013-04-03 – 2013-04-05 (×3): 325 mg via ORAL
  Filled 2013-04-03 (×3): qty 1

## 2013-04-03 MED ORDER — ASPIRIN 300 MG RE SUPP
300.0000 mg | Freq: Every day | RECTAL | Status: DC
  Filled 2013-04-03 (×3): qty 1

## 2013-04-03 NOTE — Progress Notes (Signed)
PT  Note  Patient Details Name: Anthony Delgado MRN: [id number] DOB: [birth year]   Cancelled Treatment:    Reason Eval/Treat Not Completed: Other (comment) (Eval order is to start 04/04/13)  Per MD order PT will evaluate on 04/04/13   Donnella Sham 04/03/2013, 7:17 AM Lavona Mound, PT  [phone] 04/03/2013

## 2013-04-03 NOTE — ED Provider Notes (Signed)
Medical screening examination/treatment/procedure(s) were conducted as a shared visit with resident-physician practitioner(s) and myself.  I personally evaluated the patient during the encounter.  Pt is a 37 y.o. male with pmhx as above presenting with sudden onset L sided weakness, numbness and was seen as code CVA.  Pt found to have no CT changes, had improvement of weakness during course of ED visit.  Neuro did not feel pt would be a good candidate for TPA unless symptoms worsened prior to 00:00.  Pt admitted to hospitalist service for TIA w/o.   Shanna Cisco, MD 04/03/13 [phone]

## 2013-04-03 NOTE — Consult Note (Addendum)
CARDIOLOGY CONSULT NOTE  Patient ID: Anthony Delgado, MRN: [id number], DOB/AGE: 1976-04-30 37 y.o. Admit date: 04/02/2013 Date of Consult: 04/03/2013  Primary Physician: Pcp Not In System Primary Cardiologist: none Referring Physician: Dr Tat  Chief Complaint: chest pain Reason for Consultation: chest pain  HPI:37 year-old male presented yesterday with chest pain and left-sided numbness/weakness. He was evaluated for a TIA. CT scan of the brain was negative. Serial cardiac enzymes have been negative. We are asked to see him for further evaluation and management of chest pain.  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 He has had similar episodes in the past any symptoms associated with stress or exertion. These episodes have been recent over the last few months. There is not a long-standing history of chest pain. He denies associated shortness of breath, diaphoresis, nausea, or vomiting. He has no personal history of heart disease. He denies hypertension or hyperlipidemia. He is a lifelong nonsmoker. There is no history of heart disease in his family.  At the present time, he describes a very mild "soreness" in the left chest, but no real chest pain. His left-sided weakness and numbness have completely resolved.  Review of lab work shows a troponin of less than 0.3013 separate blood draws. His blood glucose was 91. His total cholesterol is 194 with an HDL of 28 and an LDL of 127. He has a past history of elevated liver function tests, but at present they are back within normal limits. His AST has been as high as 205 and his ALT has been as high as 278.  History reviewed. No pertinent past medical history.    Surgical History:  Past Surgical History  Procedure Laterality Date  . Cholecystectomy  2009     Home Meds: Prior to Admission medications   Not on File    Inpatient Medications:  . aspirin  300 mg Rectal  Daily   Or  . aspirin  325 mg Oral Daily  . heparin  5,000 Units Subcutaneous Q8H      Allergies: No Known Allergies  History   Social History  . Marital Status: Married    Spouse Name: N/A    Number of Children: N/A  . Years of Education: N/A   Occupational History  . Not on file.   Social History Main Topics  . Smoking status: Never Smoker   . Smokeless tobacco: Not on file  . Alcohol Use: No  . Drug Use: No  . Sexually Active: Yes -- Male partner(s)   Other Topics Concern  . Not on file   Social History Narrative  . No narrative on file     Family History  Problem Relation Age of Onset  . Cancer Mother   . Stroke Neg Hx     None at early ages  . CAD Neg Hx     None at early ages     Review of Systems: General: negative for chills, fever, night sweats or weight changes.  ENT: negative for rhinorrhea or epistaxis Cardiovascular: negative for shortness of breath, dyspnea on exertion, edema, orthopnea, palpitations, or paroxysmal nocturnal dyspnea Dermatological: negative for rash Respiratory: negative for cough or wheezing GI: negative for nausea, vomiting, diarrhea, bright red blood per rectum, melena, or hematemesis GU: no hematuria, urgency, or frequency Neurologic: negative for visual changes, syncope, headache, or dizziness, otherwise see history of present illness Heme: no easy bruising or bleeding Endo: negative for excessive thirst,  thyroid disorder, or flushing Musculoskeletal: negative for joint pain or swelling, negative for myalgias All other systems reviewed and are otherwise negative except as noted above.  Physical Exam: Blood pressure 161/92, pulse 91, temperature 98 F (36.7 C), temperature source Oral, resp. rate 18, height 6' (1.829 m), weight 131.588 kg (290 lb 1.6 oz), SpO2 100.00%. General: Well developed, well nourished, pleasant obese male, alert and oriented, in no acute distress. HEENT: Normocephalic, atraumatic, sclera  non-icteric, no xanthomas, nares are without discharge.  Neck: Supple. Carotids 2+ without bruits. JVP normal Lungs: Clear bilaterally to auscultation without wheezes, rales, or rhonchi. Breathing is unlabored. Heart: RRR with normal S1 and S2. No murmurs, rubs, or gallops appreciated. Abdomen: Soft, non-tender, non-distended with normoactive bowel sounds. No hepatomegaly. No rebound/guarding. No obvious abdominal masses. Back: No CVA tenderness Msk:  Strength and tone appear normal for age. Extremities: No clubbing, cyanosis, or edema.  Distal pedal pulses are 2+ and equal bilaterally. Neuro: CNII-XII intact, moves all extremities spontaneously. Psych:  Responds to questions appropriately with a normal affect.    Labs:  Recent Labs  04/02/13 2059 04/03/13 0515 04/03/13 0930 04/03/13 1411  TROPONINI <0.30 <0.30 <0.30 <0.30   Lab Results  Component Value Date   WBC 6.6 04/02/2013   HGB 16.0 04/02/2013   HCT 47.0 04/02/2013   MCV 87.7 04/02/2013   PLT 238 04/02/2013    Recent Labs Lab 04/02/13 2059 04/02/13 2111  NA 140 145  K 4.1 4.1  CL 106 107  CO2 24  --   BUN 12 12  CREATININE 1.42* 1.50*  CALCIUM 9.4  --   PROT 7.3  --   BILITOT 0.3  --   ALKPHOS 70  --   ALT 39  --   AST 46*  --   GLUCOSE 97 97   Lab Results  Component Value Date   CHOL 194 04/03/2013   HDL 28* 04/03/2013   LDLCALC 127* 04/03/2013   TRIG 193* 04/03/2013   No results found for this basename: DDIMER    Radiology/Studies:  Ct Head (brain) Wo Contrast  04/02/2013   *RADIOLOGY REPORT*  Clinical Data: Left-sided weakness and chest pain  CT HEAD WITHOUT CONTRAST  Technique:  Contiguous axial images were obtained from the base of the skull through the vertex without contrast.  Comparison: None.  Findings: The bony calvarium is intact.  No gross soft tissue abnormality is seen.  No findings to suggest acute hemorrhage, acute infarction or space-occupying mass lesion are identified.  IMPRESSION: No acute  abnormality noted.  These results were called by telephone on 04/02/2013 at 2100 hours to Dr. Roseanne Reno, who verbally acknowledged these results.   Original Report Authenticated By: Alcide Clever, M.D.    EKG: Currently pending. Initial EKG unavailable for review.  ASSESSMENT AND PLAN:  1. Chest pain with typical and atypical features. The patient's cardiac markers are normal and this is reassuring. I will review his EKG is as it is available. This has been reordered. Considering the exertional component and description of pain is a pressure-like sensation, I have recommended that we proceed with a Lexiscan Myoview stress test for further risk stratification. This will be ordered for tomorrow morning. The patient should continue on daily aspirin. I do not think he requires anticoagulation with heparin. He is receiving heparin for DVT prophylaxis. His cardiac risk factors include obesity and hyperlipidemia.  2. Hyperlipidemia. Marked by LDL cholesterol of 127 and HDL cholesterol less than 40. However, with review of his  liver function tests, I would not recommend a statin drug as his transaminases have been markedly elevated within the last year.  3. TIA - per neuro team. CT brain negative, MRI pending  Will followup after his stress test is completed tomorrow.  Enzo Bi  04/03/2013, 6:11 PM

## 2013-04-03 NOTE — Progress Notes (Signed)
[facility] PROGRESS NOTE  [facility] Cortez ZOX:[id number] DOB: [birth year] DOA: 04/02/2013 PCP: Pcp Not In System Anthony Delgado is an 37 y.o. male with no known medical disease presenting with new onset left-sided weakness involving arm and leg, with onset at 7:30 PM today. Patient has no previous history of stroke nor TIA. He has not been on antiplatelet therapy. CT scan of his head showed no acute intracranial abnormality. NIH stroke score was 1 for persistent numbness. Patient was not deemed a candidate for TPA because of the mildness of his deficits. He was admitted to the neuro floor for further evaluation and treatment.  Assessment/Plan: Left hemiparesis with sensory disturbance -CT brain negative -MRI brain -MRA brain -Echocardiogram -Carotid Doppler -Appreciate neurology followup -hemoglobin A1c -continue aspirin 325 mg daily Hyperlipidemia -LDL 127 -Start statin per neurology recommendation Chest pain -Patient has typical and atypical components -Consult cardiology for possible risk stratification -troponins negative x3 -obtain chest x-ray Renal insufficiency -Renal ultrasound -Creatinine 1.02 on 07/08/2012 -Urine drug screen -unclear at this point if the patient has CKD. Will need further monitoring in the outpatient setting to clarify  this diagnosis.    Family Communication:   Pt at beside Disposition Plan:   Home when medically stable       Procedures/Studies: Ct Head (brain) Wo Contrast  04/02/2013   *RADIOLOGY REPORT*  Clinical Data: Left-sided weakness and chest pain  CT HEAD WITHOUT CONTRAST  Technique:  Contiguous axial images were obtained from the base of the skull through the vertex without contrast.  Comparison: None.  Findings: The bony calvarium is intact.  No gross soft tissue abnormality is seen.  No findings to suggest acute hemorrhage, acute infarction or space-occupying mass lesion are identified.  IMPRESSION: No acute abnormality noted.  These  results were called by telephone on 04/02/2013 at 2100 hours to Dr. Roseanne Reno, who verbally acknowledged these results.   Original Report Authenticated By: Alcide Clever, M.D.         Subjective: Patient continues to complain of some mild weakness of his left upper extremity. Denies any headache, dizziness, visual disturbance, dysarthria, chest pain, shortness breath, nausea, vomiting, diarrhea, abdominal pain  Objective: Filed Vitals:   04/03/13 0537 04/03/13 0805 04/03/13 0951 04/03/13 1355  BP: 108/71 109/64 133/86 161/92  Pulse: 79 76 89 91  Temp: 98.1 F (36.7 C) 98.4 F (36.9 C) 97.9 F (36.6 C) 98 F (36.7 C)  TempSrc: Oral Oral Oral Oral  Resp: 20 20 20 18   Height:      Weight:      SpO2: 97% 97% 98% 100%    Intake/Output Summary (Last 24 hours) at 04/03/13 1719 Last data filed at 04/03/13 0537  Gross per 24 hour  Intake      0 ml  Output    200 ml  Net   -200 ml   Weight change:  Exam:   General:  Pt is alert, follows commands appropriately, not in acute distress  HEENT: No icterus, No thrush, No neck mass, [place]/AT  Cardiovascular: RRR, S1/S2, no rubs, no gallops  Respiratory: CTA bilaterally, no wheezing, no crackles, no rhonchi  Abdomen: Soft/+BS, non tender, non distended, no guarding  Extremities: No edema, No lymphangitis, No petechiae, No rashes, no synovitis  Data Reviewed: Basic Metabolic Panel:  Recent Labs Lab 04/02/13 2059 04/02/13 2111  NA 140 145  K 4.1 4.1  CL 106 107  CO2 24  --   GLUCOSE 97 97  BUN 12 12  CREATININE 1.42* 1.50*  CALCIUM 9.4  --    Liver Function Tests:  Recent Labs Lab 04/02/13 2059  AST 46*  ALT 39  ALKPHOS 70  BILITOT 0.3  PROT 7.3  ALBUMIN 3.4*   No results found for this basename: LIPASE, AMYLASE,  in the last 168 hours No results found for this basename: AMMONIA,  in the last 168 hours CBC:  Recent Labs Lab 04/02/13 2059 04/02/13 2111  WBC 6.6  --   NEUTROABS 3.6  --   HGB 15.8 16.0  HCT  43.4 47.0  MCV 87.7  --   PLT 238  --    Cardiac Enzymes:  Recent Labs Lab 04/02/13 2059 04/03/13 0515 04/03/13 0930 04/03/13 1411  TROPONINI <0.30 <0.30 <0.30 <0.30   BNP: No components found with this basename: POCBNP,  CBG:  Recent Labs Lab 04/02/13 2113 04/03/13 1156 04/03/13 1647  GLUCAP 86 114* 91    No results found for this or any previous visit (from the past 240 hour(s)).   Scheduled Meds: . aspirin  300 mg Rectal Daily   Or  . aspirin  325 mg Oral Daily  . heparin  5,000 Units Subcutaneous Q8H   Continuous Infusions:    Anthony Grieser, DO  [facility] Pager [phone]  If 7PM-7AM, please contact night-coverage [email or web address] Password [email or web address] 04/03/2013, 5:19 PM   LOS: 1 day

## 2013-04-03 NOTE — Progress Notes (Signed)
Utilization review completed. Quinlynn Cuthbert, RN, BSN. 

## 2013-04-03 NOTE — H&P (Addendum)
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  He has no PMH of stroke, TIA, nor CAD.  Actually the patient has been previously without medical problems except for cholecystectomy in 2009.  Patient was taken to the ED as a code stroke, Dr. Roseanne Reno evaluated patient, decision was made not to give TPA secondary to resolving symptoms and very mild symptoms.  Initial troponin was negative.  Hospitalist asked to admit for stroke workup.  Review of Systems: 12 systems reviewed and otherwise negative.  History reviewed. No pertinent past medical history. Past Surgical History  Procedure Laterality Date  . Cholecystectomy  2009   Social History:  reports that he has never smoked. He does not have any smokeless tobacco history on file. He reports that he does not drink alcohol or use illicit drugs.   No Known Allergies  Family History  Problem Relation Age of Onset  . Cancer Mother   . Stroke Neg Hx     None at early ages  . CAD Neg Hx     None at early ages    Prior to Admission medications   Not on File   Physical Exam: Filed Vitals:   04/03/13 0132  BP: 121/75  Pulse: 86  Temp: 97.6 F (36.4 C)  Resp: 20    General:  NAD, resting comfortably in bed Eyes: PEERLA EOMI ENT: mucous membranes moist Neck: supple w/o JVD Cardiovascular: RRR w/o MRG Respiratory: CTA B Abdomen: soft, nt, nd, bs+ Skin: no rash nor lesion Musculoskeletal: MAE, full ROM all 4 extremities Psychiatric: normal tone and affect Neurologic: AAOx3, not really able to appreciate much of a motor deficit, does have reduced sensation on L extremities when compared to  right  Labs on Admission:  Basic Metabolic Panel:  Recent Labs Lab 04/02/13 2059 04/02/13 2111  NA 140 145  K 4.1 4.1  CL 106 107  CO2 24  --   GLUCOSE 97 97  BUN 12 12  CREATININE 1.42* 1.50*  CALCIUM 9.4  --    Liver Function Tests:  Recent Labs Lab 04/02/13 2059  AST 46*  ALT 39  ALKPHOS 70  BILITOT 0.3  PROT 7.3  ALBUMIN 3.4*   No results found for this basename: LIPASE, AMYLASE,  in the last 168 hours No results found for this basename: AMMONIA,  in the last 168 hours CBC:  Recent Labs Lab 04/02/13 2059 04/02/13 2111  WBC 6.6  --   NEUTROABS 3.6  --   HGB 15.8 16.0  HCT 43.4 47.0  MCV 87.7  --   PLT 238  --    Cardiac Enzymes:  Recent Labs Lab 04/02/13 2059  TROPONINI <0.30    BNP (last 3 results) No results found for this basename: PROBNP,  in the last 8760 hours CBG:  Recent Labs Lab 04/02/13 2113  GLUCAP 86    Radiological Exams on Admission: Ct Head (brain) Wo Contrast  04/02/2013   *RADIOLOGY REPORT*  Clinical Data: Left-sided weakness and chest pain  CT HEAD WITHOUT CONTRAST  Technique:  Contiguous axial images were obtained from the base of the skull through the vertex without contrast.  Comparison:  None.  Findings: The bony calvarium is intact.  No gross soft tissue abnormality is seen.  No findings to suggest acute hemorrhage, acute infarction or space-occupying mass lesion are identified.  IMPRESSION: No acute abnormality noted.  These results were called by telephone on 04/02/2013 at 2100 hours to Dr. Roseanne Reno, who verbally acknowledged these results.   Original Report Authenticated By: Alcide Clever, M.D.    EKG: Independently reviewed.  Assessment/Plan Active Problems:   TIA (transient ischemic attack)   1. TIA - On stroke path way with usual tele monitor, carotid dopplers, MRI/MRA, 2d echo all ordered.  Neurology has seen patient, patient on ASA 325.  PT/OT ordered, no speech deficit.  Additionally ordering anticoaguable work  up as recommended by Dr. Roseanne Reno in this patient due to his very young age and absence of a reason to have a stroke.  Ordering antiphospholipid / lupus anticoagulant and anticardiolipin antibodies.  Also ordering factor v leiden but this is very low yield to cause arterial embolic events. 2. Chest pain - initial troponins negative, serial troponins ordered, doesn't sound a whole lot like CAD, on tele monitor.    Code Status: Full Code (must indicate code status--if unknown or must be presumed, indicate so) Family Communication: Spoke with wife at bedside (indicate person spoken with, if applicable, with phone number if by telephone) Disposition Plan: Admit to inpatient (indicate anticipated LOS)  Time spent: 50 min  Cory Rama M. [facility] Pager [phone]  If 7PM-7AM, please contact night-coverage [email or web address] Password [email or web address] 04/03/2013, 2:18 AM

## 2013-04-03 NOTE — Progress Notes (Signed)
  Echocardiogram 2D Echocardiogram has been performed.  04/03/2013 4:16 PM Gertie Fey, RVT, RDCS, RDMS

## 2013-04-03 NOTE — Progress Notes (Signed)
*  PRELIMINARY RESULTS* Vascular Ultrasound Carotid Duplex (Doppler) has been completed.   1-39% internal carotid artery stenosis bilaterally. The right vertebral artery is patent with antegrade flow. Unable to visualize the left vertebral artery.  04/03/2013 5:04 PM Gertie Fey, RVT, RDCS, RDMS

## 2013-04-03 NOTE — Progress Notes (Signed)
Stroke Team Progress Note  HISTORY Anthony Delgado is an 37 y.o. male with no known medical disease presenting with new onset left-sided weakness involving arm and leg, with onset at 7:30 PM today. Patient has no previous history of stroke nor TIA. He has not been on antiplatelet therapy. CT scan of his head showed no acute intracranial abnormality. NIH stroke score was 1 for persistent numbness. Patient was not deemed a candidate for TPA because of the mildness of his deficits. He was admitted to the neuro floor for further evaluation and treatment.  SUBJECTIVE  Patient lying in bed. Feels strength is back, sensation near normal. Feels hydrated now.    OBJECTIVE Most recent Vital Signs: Filed Vitals:   04/03/13 0350 04/03/13 0537 04/03/13 0805 04/03/13 0951  BP: 87/53 108/71 109/64 133/86  Pulse: 81 79 76 89  Temp: 97.9 F (36.6 C) 98.1 F (36.7 C) 98.4 F (36.9 C) 97.9 F (36.6 C)  TempSrc: Oral Oral Oral Oral  Resp: 20 20 20 20   Height:      Weight:      SpO2: 94% 97% 97% 98%   CBG (last 3)   Recent Labs  04/02/13 2113  GLUCAP 86    IV Fluid Intake:     MEDICATIONS  . aspirin  300 mg Rectal Daily   Or  . aspirin  325 mg Oral Daily  . heparin  5,000 Units Subcutaneous Q8H   PRN:    Diet:  General thin liquids Activity:  Activity as tolerated DVT Prophylaxis:  Heparin SQ  CLINICALLY SIGNIFICANT STUDIES Basic Metabolic Panel:  Recent Labs Lab 04/02/13 2059 04/02/13 2111  NA 140 145  K 4.1 4.1  CL 106 107  CO2 24  --   GLUCOSE 97 97  BUN 12 12  CREATININE 1.42* 1.50*  CALCIUM 9.4  --    Liver Function Tests:  Recent Labs Lab 04/02/13 2059  AST 46*  ALT 39  ALKPHOS 70  BILITOT 0.3  PROT 7.3  ALBUMIN 3.4*   CBC:  Recent Labs Lab 04/02/13 2059 04/02/13 2111  WBC 6.6  --   NEUTROABS 3.6  --   HGB 15.8 16.0  HCT 43.4 47.0  MCV 87.7  --   PLT 238  --    Coagulation:  Recent Labs Lab 04/02/13 2059  LABPROT 12.7  INR 0.97   Cardiac  Enzymes:  Recent Labs Lab 04/02/13 2059 04/03/13 0515  TROPONINI <0.30 <0.30   Urinalysis: No results found for this basename: COLORURINE, APPERANCEUR, LABSPEC, PHURINE, GLUCOSEU, HGBUR, BILIRUBINUR, KETONESUR, PROTEINUR, UROBILINOGEN, NITRITE, LEUKOCYTESUR,  in the last 168 hours Lipid Panel    Component Value Date/Time   CHOL 194 04/03/2013 0515   TRIG 193* 04/03/2013 0515   HDL 28* 04/03/2013 0515   CHOLHDL 6.9 04/03/2013 0515   VLDL 39 04/03/2013 0515   LDLCALC 127* 04/03/2013 0515   HgbA1C ---pending   Urine Drug Screen:   No results found for this basename: labopia, cocainscrnur, labbenz, amphetmu, thcu, labbarb    Alcohol Level: No results found for this basename: ETH,  in the last 168 hours  Ct Head (brain) Wo Contrast 04/02/2013  No acute abnormality noted.    MRI of the brain    MRA of the brain    2D Echocardiogram    Carotid Doppler    CXR    EKG  .   Therapy Recommendations   Physical Exam   Obese young male not in distress.Awake alert. Afebrile. Head is nontraumatic.  Neck is supple without bruit. Hearing is normal. Cardiac exam no murmur or gallop. Lungs are clear to auscultation. Distal pulses are well felt.  Neurology Exam :  Awake alert oriented x3 with normal speech and language function. Extraocular moments are full range without nystagmus. Vision acuity and fields appear normal. Face is symmetric without weakness. Tongue is midline. Motor system exam reveals no upper or lower extremity drift. Symmetric and equal strength in all 4 extremities. Deep tendon is symmetric. Touch and pinprick sensation preserved. Coordination is normal. Gait was not tested.  ASSESSMENT Mr. Anthony Delgado is a 37 y.o. male presenting with left sided hemiparesis and hemisensory loss. CT Imaging confirms no acute infarct. MR study is pending.  On no antithrombotics prior to admission. Now on aspirin 325mg  daily for secondary stroke prevention. Patient with resultant minimal left arm  numbness. Work up underway.   Concurrenet chest pain, resolved, troponins negative  Hyperlipidemina, LDL 127 with goal < 100.   Hypercoagulability testing   Dehydration  Transient hypotension, corrected  Hospital day # 1  TREATMENT/PLAN  Continue aspirin 325 mg orally every day for secondary stroke prevention.  I would not add a statin at this point due to elevated liver disease as seen now and as documented over the past year, as this is also a known side effect in statin medications. Recommend patient counseling on heart healthy diet.  Keep hydrated, avoid hypotensive states.  Await final hypercoag testing  Await therapy/echo/carotid/mri testing  Gwendolyn Lima. Manson Passey, [facility], MBA, MHA [facility] Pager: [phone] 04/03/2013 12:32 PM  I have personally obtained a history, examined the patient, evaluated imaging results, and formulated the assessment and plan of care. I agree with the above. Delia Heady, MD

## 2013-04-04 ENCOUNTER — Inpatient Hospital Stay (HOSPITAL_COMMUNITY): Payer: MEDICAID

## 2013-04-04 DIAGNOSIS — R079 Chest pain, unspecified: Secondary | ICD-10-CM

## 2013-04-04 DIAGNOSIS — R7989 Other specified abnormal findings of blood chemistry: Secondary | ICD-10-CM

## 2013-04-04 LAB — URINALYSIS, ROUTINE W REFLEX MICROSCOPIC
Bilirubin Urine: NEGATIVE
Hgb urine dipstick: NEGATIVE
Ketones, ur: NEGATIVE mg/dL
Nitrite: NEGATIVE
Urobilinogen, UA: 0.2 mg/dL (ref 0.0–1.0)

## 2013-04-04 LAB — URINALYSIS W MICROSCOPIC + REFLEX CULTURE
Bilirubin Urine: NEGATIVE
Ketones, ur: 15 mg/dL — AB
Nitrite: NEGATIVE
Protein, ur: NEGATIVE mg/dL
Specific Gravity, Urine: 1.039 — ABNORMAL HIGH (ref 1.005–1.030)
Urobilinogen, UA: 0.2 mg/dL (ref 0.0–1.0)

## 2013-04-04 LAB — BASIC METABOLIC PANEL
BUN: 13 mg/dL (ref 6–23)
Calcium: 8.6 mg/dL (ref 8.4–10.5)
Creatinine, Ser: 1.18 mg/dL (ref 0.50–1.35)
GFR calc Af Amer: 88 mL/min — ABNORMAL LOW (ref >90)
GFR calc non Af Amer: 76 mL/min — ABNORMAL LOW (ref >90)

## 2013-04-04 LAB — GLUCOSE, CAPILLARY
Glucose-Capillary: 77 mg/dL (ref 70–99)
Glucose-Capillary: 94 mg/dL (ref 70–99)
Glucose-Capillary: 93 mg/dL (ref 70–99)

## 2013-04-04 MED ORDER — REGADENOSON 0.4 MG/5ML IV SOLN
0.4000 mg | Freq: Once | INTRAVENOUS | Status: AC
  Administered 2013-04-04: 0.4 mg via INTRAVENOUS
  Filled 2013-04-04: qty 5

## 2013-04-04 MED ORDER — REGADENOSON 0.4 MG/5ML IV SOLN
INTRAVENOUS | Status: AC
  Filled 2013-04-04: qty 5

## 2013-04-04 NOTE — Progress Notes (Signed)
Patient arrived to room from radiology testing. Dr. Arbutus Leas ok for cardiac diet at this time. Will continue to monitor.

## 2013-04-04 NOTE — Progress Notes (Signed)
Lexiscan CL performed (Pt BP too high for GXT).

## 2013-04-04 NOTE — Progress Notes (Signed)
OT Cancellation Note  Patient Details Name: Anthony Delgado MRN: [id number] DOB: [birth year]   Cancelled Treatment:    Reason Eval/Treat Not Completed: Patient at procedure or test/ unavailable (off the unit currently)  Lucile Shutters Pager: [phone]  04/04/2013, 1:25 PM

## 2013-04-04 NOTE — Progress Notes (Signed)
[facility] PROGRESS NOTE  Anthony Delgado ZOX:[id number] DOB: 21-Aug-1976 DOA: 04/02/2013 PCP: Pcp Not In System  Assessment/Plan: Left hemiparesis with sensory disturbance  -CT brain negative  -MRI brain--negative -MRA brain--negative -Echocardiogram--EF 55-60%, no WMA -Carotid Doppler--negative -Appreciate neurology followup  -hemoglobin A1c--5.7 -continue aspirin 325 mg daily  Hyperlipidemia  -LDL 127  -holding on statin due to increased LFTs -lifestyle modification Chest pain  -Patient has typical and atypical components  -Consult cardiology for possible risk stratification  -troponins negative x3  -obtain chest x-ray--negative  Renal insufficiency  -Renal ultrasound--negative -Creatinine 1.02 on 07/08/2012  -Urine drug screen--negative -unclear at this point if the patient has CKD. Will need further monitoring in the outpatient setting to clarify this diagnosis.  Family Communication:    wifeat beside Disposition Plan:   Home 04/05/13 if stress test is neg      Procedures/Studies: Dg Chest 2 View  04/03/2013   *RADIOLOGY REPORT*  Clinical Data: Chest pain  CHEST - 2 VIEW  Comparison: 07/03/2012  Findings: The heart, mediastinum and hila are unremarkable.  Minor scarring or subsegmental atelectasis in the left upper lobe lingular segment.  This is stable.  The lungs are otherwise clear. No pleural effusion or pneumothorax.  The bony thorax is intact.  IMPRESSION: No active disease of the chest.   Original Report Authenticated By: Amie Portland, M.D.   Ct Head (brain) Wo Contrast  04/02/2013   *RADIOLOGY REPORT*  Clinical Data: Left-sided weakness and chest pain  CT HEAD WITHOUT CONTRAST  Technique:  Contiguous axial images were obtained from the base of the skull through the vertex without contrast.  Comparison: None.  Findings: The bony calvarium is intact.  No gross soft tissue abnormality is seen.  No findings to suggest acute hemorrhage, acute infarction or  space-occupying mass lesion are identified.  IMPRESSION: No acute abnormality noted.  These results were called by telephone on 04/02/2013 at 2100 hours to Dr. Roseanne Reno, who verbally acknowledged these results.   Original Report Authenticated By: Alcide Clever, M.D.   Mr Brain Wo Contrast  04/04/2013   *RADIOLOGY REPORT*  Clinical Data:  Left sided numbness and weakness.  Stroke.  MRI HEAD WITHOUT CONTRAST MRA HEAD WITHOUT CONTRAST  Technique:  Multiplanar, multiecho pulse sequences of the brain and surrounding structures were obtained without intravenous contrast. Angiographic images of the head were obtained using MRA technique without contrast.  Comparison:  CT 04/02/2013  MRI HEAD  Findings:  Negative for acute infarct.  No significant chronic ischemia.  Negative for demyelinating disease.  Cerebral white matter is normal.  Brainstem basal ganglia and cerebellum are normal. Negative for intracranial hemorrhage.  Negative for mass or edema.  Benign lipoma in the scalp over the convexity.  IMPRESSION: Normal MRI of the brain.  MRA HEAD  Findings: Both vertebral arteries are patent to the basilar.  The basilar is widely patent.  Posterior cerebral arteries are patent bilaterally.  Right internal carotid artery is widely patent.  Right anterior and middle cerebral arteries are patent.  Both anterior cerebral arteries supplied from the right due to hypoplastic left A1 segment.  Left internal carotid artery is widely patent.  The left middle cerebral artery is widely patent.  Hypoplastic left A1 segment.  Negative for cerebral aneurysm.  IMPRESSION: Negative   Original Report Authenticated By: Janeece Riggers, M.D.   US Renal  04/04/2013   *RADIOLOGY REPORT*  Clinical Data: 37 year old male with renal failure.  RENAL/URINARY TRACT ULTRASOUND COMPLETE  Comparison:  Lumbar MRI 07/06/2012. CT  abdomen and pelvis 07/04/2012.  Findings:  Right Kidney:  No hydronephrosis.  Cortical echotexture within normal limits.  Renal  length 11.1 cm.  No discrete right renal lesion.  Left Kidney:  No hydronephrosis.  Renal length 11.6 cm.  Cortical echotexture within normal limits.  Small mid pole region calculus measuring 5-6 mm, probably stable since 07/04/2012.  No other discrete left renal lesion.  Bladder:  Unremarkable.  Minimally distended.  Other findings:  Diffuse increased liver echogenicity compatible with steatosis.  IMPRESSION: 1.  No acute renal findings.  Chronic left nephrolithiasis without obstruction. 2.  Hepatic steatosis.   Original Report Authenticated By: Erskine Speed, M.D.   Mr Mra Head/brain Wo Cm  04/04/2013   *RADIOLOGY REPORT*  Clinical Data:  Left sided numbness and weakness.  Stroke.  MRI HEAD WITHOUT CONTRAST MRA HEAD WITHOUT CONTRAST  Technique:  Multiplanar, multiecho pulse sequences of the brain and surrounding structures were obtained without intravenous contrast. Angiographic images of the head were obtained using MRA technique without contrast.  Comparison:  CT 04/02/2013  MRI HEAD  Findings:  Negative for acute infarct.  No significant chronic ischemia.  Negative for demyelinating disease.  Cerebral white matter is normal.  Brainstem basal ganglia and cerebellum are normal. Negative for intracranial hemorrhage.  Negative for mass or edema.  Benign lipoma in the scalp over the convexity.  IMPRESSION: Normal MRI of the brain.  MRA HEAD  Findings: Both vertebral arteries are patent to the basilar.  The basilar is widely patent.  Posterior cerebral arteries are patent bilaterally.  Right internal carotid artery is widely patent.  Right anterior and middle cerebral arteries are patent.  Both anterior cerebral arteries supplied from the right due to hypoplastic left A1 segment.  Left internal carotid artery is widely patent.  The left middle cerebral artery is widely patent.  Hypoplastic left A1 segment.  Negative for cerebral aneurysm.  IMPRESSION: Negative   Original Report Authenticated By: Janeece Riggers, M.D.          Subjective: Patient is feeling better. He denies any fevers, chills, chest pain, shortness breath, nausea, vomiting, diarrhea. He denies a headache, visual changes, dysarthria. Left arm and left lower extremity weaknesshave improved.  Objective: Filed Vitals:   04/04/13 1114 04/04/13 1116 04/04/13 1430 04/04/13 1616  BP: 148/95 146/99 145/101 142/91  Pulse: 97 81 70   Temp:   97.3 F (36.3 C)   TempSrc:   Oral   Resp:   18   Height:      Weight:      SpO2:   99%     Intake/Output Summary (Last 24 hours) at 04/04/13 1826 Last data filed at 04/04/13 1700  Gross per 24 hour  Intake    480 ml  Output      0 ml  Net    480 ml   Weight change:  Exam:   General:  Pt is alert, follows commands appropriately, not in acute distress  HEENT: No icterus, No thrush,  [place]mo/AT  Cardiovascular: RRR, S1/S2, no rubs, no gallops  Respiratory: CTA bilaterally, no wheezing, no crackles, no rhonchi  Abdomen: Soft/+BS, non tender, non distended, no guarding  Extremities: No edema, No lymphangitis, No petechiae, No rashes, no synovitis  Data Reviewed: Basic Metabolic Panel:  Recent Labs Lab 04/02/13 2059 04/02/13 2111 04/04/13 0600  NA 140 145 138  K 4.1 4.1 3.7  CL 106 107 103  CO2 24  --  27  GLUCOSE 97 97 102*  BUN 12 12 13   CREATININE 1.42* 1.50* 1.18  CALCIUM 9.4  --  8.6   Liver Function Tests:  Recent Labs Lab 04/02/13 2059  AST 46*  ALT 39  ALKPHOS 70  BILITOT 0.3  PROT 7.3  ALBUMIN 3.4*   No results found for this basename: LIPASE, AMYLASE,  in the last 168 hours No results found for this basename: AMMONIA,  in the last 168 hours CBC:  Recent Labs Lab 04/02/13 2059 04/02/13 2111  WBC 6.6  --   NEUTROABS 3.6  --   HGB 15.8 16.0  HCT 43.4 47.0  MCV 87.7  --   PLT 238  --    Cardiac Enzymes:  Recent Labs Lab 04/02/13 2059 04/03/13 0515 04/03/13 0930 04/03/13 1411  TROPONINI <0.30 <0.30 <0.30 <0.30   BNP: No components found  with this basename: POCBNP,  CBG:  Recent Labs Lab 04/03/13 1647 04/03/13 2245 04/04/13 0709 04/04/13 1434 04/04/13 1617  GLUCAP 91 116* 96 77 94    No results found for this or any previous visit (from the past 240 hour(s)).   Scheduled Meds: . aspirin  300 mg Rectal Daily   Or  . aspirin  325 mg Oral Daily  . heparin  5,000 Units Subcutaneous Q8H  . regadenoson       Continuous Infusions:    Giabella Duhart, DO  [facility] Pager [phone]  If 7PM-7AM, please contact night-coverage [email or web address] Password [facility] 04/04/2013, 6:26 PM   LOS: 2 days

## 2013-04-04 NOTE — Progress Notes (Signed)
Stress test is 2 day study, will be completed tomorrow.

## 2013-04-04 NOTE — Evaluation (Signed)
Physical Therapy Evaluation Patient Details Name: Anthony Delgado MRN: [id number] DOB: 10/01/1975 Today's Date: 04/04/2013 Time: 4098-1191 PT Time Calculation (min): 16 min  PT Assessment / Plan / Recommendation History of Present Illness  Patient is a 37 yo male admitted with Lt sided weakness and chest pain.  Clinical Impression  Patient now independent with all mobility and gait.  Balance tested normal when performing high level balance activities.  No acute PT needs identified - PT will sign off.    PT Assessment  Patent does not need any further PT services    Follow Up Recommendations  No PT follow up    Does the patient have the potential to tolerate intense rehabilitation      Barriers to Discharge        Equipment Recommendations  None recommended by PT    Recommendations for Other Services     Frequency      Precautions / Restrictions Precautions Precautions: None Restrictions Weight Bearing Restrictions: No   Pertinent Vitals/Pain BP at 142/91 - RN notified.  Down from prior reading.      Mobility  Bed Mobility Bed Mobility: Not assessed Transfers Transfers: Sit to Stand;Stand to Sit Sit to Stand: 7: Independent;From chair/3-in-1 Stand to Sit: 7: Independent;To chair/3-in-1 Ambulation/Gait Ambulation/Gait Assistance: 7: Independent Ambulation Distance (Feet): 220 Feet Assistive device: None Ambulation/Gait Assistance Details: Patient with good gait pattern, balance, and speed.  No loss of balance. Gait Pattern: Within Functional Limits Gait velocity: WFL Modified Rankin (Stroke Patients Only) Pre-Morbid Rankin Score: No symptoms Modified Rankin: No symptoms        PT Goals(Current goals can be found in the care plan section)  N/A  Visit Information  Last PT Received On: 04/04/13 Assistance Needed: +1 History of Present Illness: Patient is a 37 yo male admitted with Lt sided weakness and chest pain.       Prior Functioning  Home  Living Family/patient expects to be discharged to:: Private residence Living Arrangements: Spouse/significant other Available Help at Discharge: Family;Available 24 hours/day Type of Home: House Home Access: Level entry Home Layout: One level Home Equipment: None Prior Function Level of Independence: Independent Communication Communication: No difficulties    Cognition  Cognition Arousal/Alertness: Awake/alert Behavior During Therapy: WFL for tasks assessed/performed Overall Cognitive Status: Within Functional Limits for tasks assessed    Extremity/Trunk Assessment Upper Extremity Assessment Upper Extremity Assessment: Overall WFL for tasks assessed Lower Extremity Assessment Lower Extremity Assessment: Overall WFL for tasks assessed (Symmetrical strength)   Balance Balance Balance Assessed: Yes Static Standing Balance Single Leg Stance - Right Leg: 30 Single Leg Stance - Left Leg: 30 Rhomberg - Eyes Opened: 30 Rhomberg - Eyes Closed: 30 (with slight sway) High Level Balance High Level Balance Activites: Direction changes;Turns;Sudden stops;Head turns (Stepping around and over obstacles) High Level Balance Comments: No loss of balance with any high level balance activities.  End of Session PT - End of Session Activity Tolerance: Patient tolerated treatment well Patient left: in chair;with call bell/phone within reach;with family/visitor present Nurse Communication: Mobility status (BP remains high, but trending down (112/91))  GP     Vena Austria 04/04/2013, 4:34 PM Durenda Hurt. Renaldo Fiddler, [facility] Acute Rehab Services Pager [phone]

## 2013-04-04 NOTE — Progress Notes (Addendum)
Patient Name: Anthony Delgado Date of Encounter: 04/04/2013  Active Problems:   TIA (transient ischemic attack)   Sensory disturbance   Left hemiparesis   Renal insufficiency   Other and unspecified hyperlipidemia   Chest pain at rest    SUBJECTIVE: No chest pain, no SOB, no more numbness  OBJECTIVE Filed Vitals:   04/04/13 1114 04/04/13 1116 04/04/13 1430 04/04/13 1616  BP: 148/95 146/99 145/101 142/91  Pulse: 97 81 70   Temp:   97.3 F (36.3 C)   TempSrc:   Oral   Resp:   18   Height:      Weight:      SpO2:   99%     Intake/Output Summary (Last 24 hours) at 04/04/13 1705 Last data filed at 04/04/13 1500  Gross per 24 hour  Intake    240 ml  Output      0 ml  Net    240 ml   Filed Weights   04/02/13 2300  Weight: 290 lb 1.6 oz (131.588 kg)    PHYSICAL EXAM General: Well developed, well nourished, male in no acute distress. Head: Normocephalic, atraumatic.  Neck: Supple without bruits, JVD not elevated. Lungs:  Resp regular and unlabored, CTA. Heart: RRR, S1, S2, no S3, S4, or murmur; no rub. Abdomen: Soft, non-tender, non-distended, BS + x 4.  Extremities: No clubbing, cyanosis, no edema.  Neuro: Alert and oriented X 3. Moves all extremities spontaneously. Psych: Normal affect.   LABS: CBC:  Recent Labs  04/02/13 2059 04/02/13 2111  WBC 6.6  --   NEUTROABS 3.6  --   HGB 15.8 16.0  HCT 43.4 47.0  MCV 87.7  --   PLT 238  --    INR:  Recent Labs  04/02/13 2059  INR 0.97   Basic Metabolic Panel:  Recent Labs  16/10/96 2059 04/02/13 2111 04/04/13 0600  NA 140 145 138  K 4.1 4.1 3.7  CL 106 107 103  CO2 24  --  27  GLUCOSE 97 97 102*  BUN 12 12 13   CREATININE 1.42* 1.50* 1.18  CALCIUM 9.4  --  8.6   Liver Function Tests:  Recent Labs  04/02/13 2059  AST 46*  ALT 39  ALKPHOS 70  BILITOT 0.3  PROT 7.3  ALBUMIN 3.4*   Cardiac Enzymes:  Recent Labs  04/03/13 0515 04/03/13 0930 04/03/13 1411  TROPONINI <0.30 <0.30  <0.30    Recent Labs  04/02/13 2111  TROPIPOC 0.00   Hemoglobin A1C:  Recent Labs  04/03/13 0515  HGBA1C 5.7*   Fasting Lipid Panel:  Recent Labs  04/03/13 0515  CHOL 194  HDL 28*  LDLCALC 127*  TRIG 193*  CHOLHDL 6.9   TELE:  SR      ECG: No acute ischemic changes.   Radiology/Studies: Dg Chest 2 View 04/03/2013   *RADIOLOGY REPORT*  Clinical Data: Chest pain  CHEST - 2 VIEW  Comparison: 07/03/2012  Findings: The heart, mediastinum and hila are unremarkable.  Minor scarring or subsegmental atelectasis in the left upper lobe lingular segment.  This is stable.  The lungs are otherwise clear. No pleural effusion or pneumothorax.  The bony thorax is intact.  IMPRESSION: No active disease of the chest.   Original Report Authenticated By: Amie Portland, M.D.   Ct Head (brain) Wo Contrast 04/02/2013   *RADIOLOGY REPORT*  Clinical Data: Left-sided weakness and chest pain  CT HEAD WITHOUT CONTRAST  Technique:  Contiguous axial images were obtained  from the base of the skull through the vertex without contrast.  Comparison: None.  Findings: The bony calvarium is intact.  No gross soft tissue abnormality is seen.  No findings to suggest acute hemorrhage, acute infarction or space-occupying mass lesion are identified.  IMPRESSION: No acute abnormality noted.  These results were called by telephone on 04/02/2013 at 2100 hours to Dr. Roseanne Reno, who verbally acknowledged these results.   Original Report Authenticated By: Alcide Clever, M.D.   Mr Brain Wo Contrast 04/04/2013   *RADIOLOGY REPORT*  Clinical Data:  Left sided numbness and weakness.  Stroke.  MRI HEAD WITHOUT CONTRAST MRA HEAD WITHOUT CONTRAST  Technique:  Multiplanar, multiecho pulse sequences of the brain and surrounding structures were obtained without intravenous contrast. Angiographic images of the head were obtained using MRA technique without contrast.  Comparison:  CT 04/02/2013  MRI HEAD  Findings:  Negative for acute infarct.  No  significant chronic ischemia.  Negative for demyelinating disease.  Cerebral white matter is normal.  Brainstem basal ganglia and cerebellum are normal. Negative for intracranial hemorrhage.  Negative for mass or edema.  Benign lipoma in the scalp over the convexity.  IMPRESSION: Normal MRI of the brain.    MRA HEAD  Findings: Both vertebral arteries are patent to the basilar.  The basilar is widely patent.  Posterior cerebral arteries are patent bilaterally.  Right internal carotid artery is widely patent.  Right anterior and middle cerebral arteries are patent.  Both anterior cerebral arteries supplied from the right due to hypoplastic left A1 segment.  Left internal carotid artery is widely patent.  The left middle cerebral artery is widely patent.  Hypoplastic left A1 segment.  Negative for cerebral aneurysm.  IMPRESSION: Negative   Original Report Authenticated By: Janeece Riggers, M.D.   US Renal 04/04/2013   *RADIOLOGY REPORT*  Clinical Data: 37 year old male with renal failure.  RENAL/URINARY TRACT ULTRASOUND COMPLETE  Comparison:  Lumbar MRI 07/06/2012. CT abdomen and pelvis 07/04/2012.  Findings:  Right Kidney:  No hydronephrosis.  Cortical echotexture within normal limits.  Renal length 11.1 cm.  No discrete right renal lesion.  Left Kidney:  No hydronephrosis.  Renal length 11.6 cm.  Cortical echotexture within normal limits.  Small mid pole region calculus measuring 5-6 mm, probably stable since 07/04/2012.  No other discrete left renal lesion.  Bladder:  Unremarkable.  Minimally distended.  Other findings:  Diffuse increased liver echogenicity compatible with steatosis.  IMPRESSION: 1.  No acute renal findings.  Chronic left nephrolithiasis without obstruction. 2.  Hepatic steatosis.   Original Report Authenticated By: Erskine Speed, M.D.   Current Medications:  . aspirin  300 mg Rectal Daily   Or  . aspirin  325 mg Oral Daily  . heparin  5,000 Units Subcutaneous Q8H  . regadenoson           ASSESSMENT AND PLAN:   Chest pain at rest - ez negative for MI. CL to assess for ischemia. Pt had been outside exercising just before symptoms began. No history of HTN but BP high at times in hospital. If CL negative for ischemia and EF normal, MD advise on further eval.  Otherwise, per primary MD. Active Problems:   TIA (transient ischemic attack)   Sensory disturbance   Left hemiparesis   Renal insufficiency   Other and unspecified hyperlipidemia   Signed, MCALHANY,CHRISTOPHER , PA-C 5:05 PM 04/04/2013  I have personally seen and examined this patient with Theodore Demark PA-C. I agree with the assessment  and plan as outlined above. Atypical chest pain. Stress myoview in progress. 2 day study. If no ischemia on final images tomorrow, then d/c home without further ischemic workup. He can f/u with Dr. Excell Seltzer in the [facility] office.   MCALHANY,CHRISTOPHER 5:05 PM 04/04/2013

## 2013-04-04 NOTE — Progress Notes (Signed)
Stroke Team Progress Note  HISTORY Anthony Delgado is an 37 y.o. male with no known medical disease presenting with new onset left-sided weakness involving arm and leg, with onset at 7:30 PM today. Patient has no previous history of stroke nor TIA. He has not been on antiplatelet therapy. CT scan of his head showed no acute intracranial abnormality. NIH stroke score was 1 for persistent numbness. Patient was not deemed a candidate for TPA because of the mildness of his deficits. He was admitted to the neuro floor for further evaluation and treatment.  SUBJECTIVE  Patient with nuclear medicine testing. No reporting of symptoms.    OBJECTIVE Most recent Vital Signs: Filed Vitals:   04/03/13 2133 04/04/13 0200 04/04/13 0500 04/04/13 1039  BP: 132/85 112/64 139/98 150/115  Pulse: 80 75 70 85  Temp: 98.3 F (36.8 C) 97.5 F (36.4 C) 97 F (36.1 C)   TempSrc: Oral Oral Oral   Resp: 18 20 18    Height:      Weight:      SpO2: 98% 100% 100%    CBG (last 3)   Recent Labs  04/03/13 1647 04/03/13 2245 04/04/13 0709  GLUCAP 91 116* 96    IV Fluid Intake:     MEDICATIONS  . aspirin  300 mg Rectal Daily   Or  . aspirin  325 mg Oral Daily  . heparin  5,000 Units Subcutaneous Q8H  . regadenoson  0.4 mg Intravenous Once   PRN:    Diet:  NPO ---for testing. Activity:  Activity as tolerated DVT Prophylaxis:  Heparin SQ  CLINICALLY SIGNIFICANT STUDIES Basic Metabolic Panel:   Recent Labs Lab 04/02/13 2059 04/02/13 2111 04/04/13 0600  NA 140 145 138  K 4.1 4.1 3.7  CL 106 107 103  CO2 24  --  27  GLUCOSE 97 97 102*  BUN 12 12 13   CREATININE 1.42* 1.50* 1.18  CALCIUM 9.4  --  8.6   Liver Function Tests:   Recent Labs Lab 04/02/13 2059  AST 46*  ALT 39  ALKPHOS 70  BILITOT 0.3  PROT 7.3  ALBUMIN 3.4*   CBC:   Recent Labs Lab 04/02/13 2059 04/02/13 2111  WBC 6.6  --   NEUTROABS 3.6  --   HGB 15.8 16.0  HCT 43.4 47.0  MCV 87.7  --   PLT 238  --     Coagulation:   Recent Labs Lab 04/02/13 2059  LABPROT 12.7  INR 0.97   Cardiac Enzymes:   Recent Labs Lab 04/03/13 0515 04/03/13 0930 04/03/13 1411  TROPONINI <0.30 <0.30 <0.30   Urinalysis:   Recent Labs Lab 04/03/13 2315  COLORURINE YELLOW  YELLOW  LABSPEC 1.039*  1.023  PHURINE 6.0  6.5  GLUCOSEU >1000*  NEGATIVE  HGBUR NEGATIVE  NEGATIVE  BILIRUBINUR NEGATIVE  NEGATIVE  KETONESUR 15*  NEGATIVE  PROTEINUR NEGATIVE  NEGATIVE  UROBILINOGEN 0.2  0.2  NITRITE NEGATIVE  NEGATIVE  LEUKOCYTESUR NEGATIVE  NEGATIVE   Lipid Panel    Component Value Date/Time   CHOL 194 04/03/2013 0515   TRIG 193* 04/03/2013 0515   HDL 28* 04/03/2013 0515   CHOLHDL 6.9 04/03/2013 0515   VLDL 39 04/03/2013 0515   LDLCALC 127* 04/03/2013 0515   HgbA1C ---5.7   Urine Drug Screen:      Component Value Date/Time   LABOPIA NONE DETECTED 04/03/2013 2315    Alcohol Level: No results found for this basename: ETH,  in the last 168 hours  Ct  Head (brain) Wo Contrast 04/02/2013  No acute abnormality noted.    MRI of the brain  negative  MRA of the brain  negative  2D Echocardiogram  EF 55%, no wall motion abnormalities. LA normal size.  Carotid Doppler  1-39% internal carotid artery stenosis bilaterally. The right vertebral artery is patent with antegrade flow. Unable to visualize the left vertebral artery  CXR    EKG  .   Therapy Recommendations   Physical Exam     Neurology Exam :  Deferred today as patient gone for testing ASSESSMENT Mr. Anthony Delgado is a 37 y.o. male presenting with left sided hemiparesis and hemisensory loss. CT Imaging confirms no acute infarct. MR study is negative for acute..  On no antithrombotics prior to admission. Now on aspirin 325mg  daily for secondary stroke prevention. Patient with resultant minimal left arm numbness. Work up underway.   Concurrenet chest pain, resolved, troponins negative--cardiology consulted  Hyperlipidemina, LDL 127  with goal < 100.   Hypercoagulability testing   Dehydration  Transient hypotension, corrected  Hospital day # 2  TREATMENT/PLAN  Continue aspirin 325 mg orally every day for secondary stroke prevention.  I would not add a statin at this point due to elevated liver disease as seen now and as documented over the past year, as this is also a known side effect in statin medications. Recommend patient counseling on heart healthy diet.  Keep hydrated, avoid hypotensive states.  Await final hypercoag testing  Therapy as recommended  Stroke service will sign off. May follow up with Primary MD or Dr. Pearlean Brownie in 2 months.  Gwendolyn Lima. Manson Passey, [facility], MBA, MHA [facility] Pager: [phone] 04/04/2013 10:40 AM  I have personally  evaluated imaging results, and formulated the assessment and plan of care. I agree with the above. Delia Heady, MD

## 2013-04-05 ENCOUNTER — Inpatient Hospital Stay (HOSPITAL_COMMUNITY): Payer: MEDICAID

## 2013-04-05 LAB — BASIC METABOLIC PANEL
Chloride: 99 mEq/L (ref 96–112)
Creatinine, Ser: 1.12 mg/dL (ref 0.50–1.35)
GFR calc Af Amer: >90 mL/min (ref >90)
Potassium: 3.6 mEq/L (ref 3.5–5.1)
Sodium: 135 mEq/L (ref 135–145)

## 2013-04-05 LAB — GLUCOSE, CAPILLARY
Glucose-Capillary: 88 mg/dL (ref 70–99)
Glucose-Capillary: 95 mg/dL (ref 70–99)

## 2013-04-05 MED ORDER — ASPIRIN EC 81 MG PO TBEC: 81.0000 mg | DELAYED_RELEASE_TABLET | Freq: Every day | ORAL | Status: DC

## 2013-04-05 MED ORDER — UNABLE TO FIND: Status: DC

## 2013-04-05 MED ORDER — TECHNETIUM TC 99M SESTAMIBI GENERIC - CARDIOLITE
30.0000 | Freq: Once | INTRAVENOUS | Status: AC | PRN
  Administered 2013-04-04 – 2013-04-05 (×2): 30 via INTRAVENOUS

## 2013-04-05 MED ORDER — TECHNETIUM TC 99M SESTAMIBI GENERIC - CARDIOLITE: 30.0000 | Freq: Once | INTRAVENOUS | Status: AC | PRN

## 2013-04-05 NOTE — Progress Notes (Signed)
OT Cancellation Note  Patient Details Name: Anthony Delgado MRN: [id number] DOB: February 22, 1976   Cancelled Treatment:    Reason Eval/Treat Not Completed: OT screened, no needs identified, will sign off. Per PT note, pt at independent level.   04/05/2013 Cipriano Mile OTR/L Pager [phone] Office [phone]

## 2013-04-05 NOTE — Progress Notes (Signed)
Normal stress test. As previously planned, pt can f/u Dr. Excell Seltzer in [facility] office. Office will call him with this appt. Khylin Gutridge PA-C

## 2013-04-05 NOTE — Discharge Summary (Signed)
Physician Discharge Summary  Anthony Delgado ZOX:[id number] DOB: Jul 03, 1976 DOA: 04/02/2013  PCP: Pcp Not In System  Admit date: 04/02/2013 Discharge date: 04/05/2013  Recommendations for Outpatient Follow-up:  1. Pt will need to follow up with PCP in 2 weeks post discharge 2. Please obtain BMP to evaluate electrolytes and kidney function 3. Please also check CBC to evaluate Hg and Hct levels 4. Follow up with cardiology, Dr. Excell Seltzer in 3-4 weeks  Discharge Diagnoses:  Active Problems:   TIA (transient ischemic attack)   Sensory disturbance   Left hemiparesis   Renal insufficiency   Other and unspecified hyperlipidemia   Chest pain at rest Left hemiparesis with sensory disturbance  -Urology was consulted and a stroke workup was undertaken. -CT brain negative  -MRI brain--negative  -MRA brain--negative  -Echocardiogram--EF 55-60%, no WMA  -Carotid Doppler--negative  -Appreciate neurology followup  -hemoglobin A1c--5.7  -The patient was instructed to take aspirin 81 mg daily at the time of discharge. Hyperlipidemia  -LDL 127  -holding on statin due to increased LFTs in the past year -lifestyle modification--diet and exercise were discussed with the patient Chest pain  -Patient has typical and atypical components  -Cardiology was consulted for risk stratification. Dr. Tonny Bollman saw the patient. -The patient underwent a nuclear medicine stress test which was negative for any reversible ischemia. Ejection fraction was 56%. -troponins negative x3  -obtain chest x-ray--negative  Renal insufficiency  -Renal ultrasound--negative for hydronephrosis. It did show hepatic steatosis  -Renal function did improve with some mild fluid hydration. Serum creatinine 1.2 on the day of discharge -Creatinine 1.02 on 07/08/2012  -Urine drug screen--negative  -unclear at this point if the patient has CKD. Will need further monitoring in the outpatient setting to clarify this diagnosis.  -Elevated  blood pressure without diagnosis of hypertension The patient did have intermittent elevated blood pressures -He was not started on any antihypertensive medications -The patient was instructed to followup with his primary care provider who will continue to monitor his blood pressure to make final decision on any antihypertensive regimen in the future. Family Communication: wifeat beside     Discharge Condition: Stable  Disposition:  Follow-up Information   Follow up with Tonny Bollman, MD. (Office will call you with a follow-up appointment.)    Contact information:   [address] [place] [address] [phone]       Diet: heart healthy Wt Readings from Last 3 Encounters:  04/02/13 131.588 kg (290 lb 1.6 oz)  07/06/12 119.2 kg (262 lb 12.6 oz)  07/04/12 118.842 kg (262 lb)    History of present illness:  Anthony Delgado is an 37 y.o. male with no known medical disease presenting with new onset left-sided weakness involving arm and leg, with onset at 7:30 PM on day of admission. The patient also describes some numbness and tingling in his left arm and hands. He denied any neck pain or recent trauma. The patient was at football practice on the silent at that time. However he did note that over the past several months he had had in the mid and chest discomfort with as well as without exertion. Patient has no previous history of stroke nor TIA. He has not been on antiplatelet therapy. CT scan of his head showed no acute intracranial abnormality. NIH stroke score was 1 for persistent numbness. Patient was not deemed a candidate for TPA because of the mildness of his deficits. He was admitted to the neuro floor for further evaluation and treatment.  Consultants: Neurology Cardiology, Dr. Tonny Bollman  Discharge Exam: Filed Vitals:   04/05/13 1017  BP: 134/96  Pulse: 76  Temp: 98 F (36.7 C)  Resp: 18   Filed Vitals:   04/04/13 2247 04/05/13 0218 04/05/13  0500 04/05/13 1017  BP: 135/92 142/103 122/77 134/96  Pulse: 72 73 66 76  Temp: 98.1 F (36.7 C) 98.2 F (36.8 C) 98.3 F (36.8 C) 98 F (36.7 C)  TempSrc: Oral Oral Oral Oral  Resp: 20 20 20 18   Height:      Weight:      SpO2: 100% 100% 100% 98%   General: A&O x 3, NAD, pleasant, cooperative Cardiovascular: RRR, no rub, no gallop, no S3 Respiratory: CTAB, no wheeze, no rhonchi Abdomen:soft, nontender, nondistended, positive bowel sounds Extremities: No edema, No lymphangitis, no petechiae  Discharge Instructions     Medication List         aspirin EC 81 MG tablet  Take 1 tablet (81 mg total) by mouth daily.     UNABLE TO FIND  Mr. Sauceda was admitted to [facility] from 04/02/13 to 04/05/13.  He is medically stable to return to work.         The results of significant diagnostics from this hospitalization (including imaging, microbiology, ancillary and laboratory) are listed below for reference.    Significant Diagnostic Studies: Dg Chest 2 View  04/03/2013   *RADIOLOGY REPORT*  Clinical Data: Chest pain  CHEST - 2 VIEW  Comparison: 07/03/2012  Findings: The heart, mediastinum and hila are unremarkable.  Minor scarring or subsegmental atelectasis in the left upper lobe lingular segment.  This is stable.  The lungs are otherwise clear. No pleural effusion or pneumothorax.  The bony thorax is intact.  IMPRESSION: No active disease of the chest.   Original Report Authenticated By: Amie Portland, M.D.   Ct Head (brain) Wo Contrast  04/02/2013   *RADIOLOGY REPORT*  Clinical Data: Left-sided weakness and chest pain  CT HEAD WITHOUT CONTRAST  Technique:  Contiguous axial images were obtained from the base of the skull through the vertex without contrast.  Comparison: None.  Findings: The bony calvarium is intact.  No gross soft tissue abnormality is seen.  No findings to suggest acute hemorrhage, acute infarction or space-occupying mass lesion are identified.  IMPRESSION:  No acute abnormality noted.  These results were called by telephone on 04/02/2013 at 2100 hours to Dr. Roseanne Reno, who verbally acknowledged these results.   Original Report Authenticated By: Alcide Clever, M.D.   Mr Brain Wo Contrast  04/04/2013   *RADIOLOGY REPORT*  Clinical Data:  Left sided numbness and weakness.  Stroke.  MRI HEAD WITHOUT CONTRAST MRA HEAD WITHOUT CONTRAST  Technique:  Multiplanar, multiecho pulse sequences of the brain and surrounding structures were obtained without intravenous contrast. Angiographic images of the head were obtained using MRA technique without contrast.  Comparison:  CT 04/02/2013  MRI HEAD  Findings:  Negative for acute infarct.  No significant chronic ischemia.  Negative for demyelinating disease.  Cerebral white matter is normal.  Brainstem basal ganglia and cerebellum are normal. Negative for intracranial hemorrhage.  Negative for mass or edema.  Benign lipoma in the scalp over the convexity.  IMPRESSION: Normal MRI of the brain.  MRA HEAD  Findings: Both vertebral arteries are patent to the basilar.  The basilar is widely patent.  Posterior cerebral arteries are patent bilaterally.  Right internal carotid artery is widely patent.  Right anterior and middle cerebral arteries  are patent.  Both anterior cerebral arteries supplied from the right due to hypoplastic left A1 segment.  Left internal carotid artery is widely patent.  The left middle cerebral artery is widely patent.  Hypoplastic left A1 segment.  Negative for cerebral aneurysm.  IMPRESSION: Negative   Original Report Authenticated By: Janeece Riggers, M.D.   US Renal  04/04/2013   *RADIOLOGY REPORT*  Clinical Data: 37 year old male with renal failure.  RENAL/URINARY TRACT ULTRASOUND COMPLETE  Comparison:  Lumbar MRI 07/06/2012. CT abdomen and pelvis 07/04/2012.  Findings:  Right Kidney:  No hydronephrosis.  Cortical echotexture within normal limits.  Renal length 11.1 cm.  No discrete right renal lesion.  Left Kidney:   No hydronephrosis.  Renal length 11.6 cm.  Cortical echotexture within normal limits.  Small mid pole region calculus measuring 5-6 mm, probably stable since 07/04/2012.  No other discrete left renal lesion.  Bladder:  Unremarkable.  Minimally distended.  Other findings:  Diffuse increased liver echogenicity compatible with steatosis.  IMPRESSION: 1.  No acute renal findings.  Chronic left nephrolithiasis without obstruction. 2.  Hepatic steatosis.   Original Report Authenticated By: Erskine Speed, M.D.   Nm Myocar Multi W/spect W/wall Motion / Ef  04/05/2013   *RADIOLOGY REPORT*  Clinical Data:  Chest pain  MYOCARDIAL IMAGING WITH SPECT (REST AND PHARMACOLOGIC-STRESS - 2 DAY PROTOCOL) GATED LEFT VENTRICULAR WALL MOTION STUDY LEFT VENTRICULAR EJECTION FRACTION  Technique:  Standard myocardial SPECT imaging was performed after intravenous injection of 30 mCi Tc-78m sestamibi at rest.  On a different day, intravenous infusion of  regadenoson was performed under supervision of the Cardiology staff.  At peak effect of the drug, 30 mCi Tc-9m sestamibi was injected intravenously and standard myocardial SPECT imaging was performed.  Quantitative gated imaging was also performed to evaluate left ventricular wall motion and estimate left ventricular ejection fraction.  Comparison:  Prior chest x-ray 04/03/2013  Findings:  Evaluation of the gated data demonstrates an end-diastolic volume of 116 ml and an end systolic volume of 51 ml yielding a calculated ejection fraction of 56%.  There is no focal wall motion abnormality.  Symmetric uptake of radiotracer throughout the ventricular myocardium.  There is no fixed or reversible defect to suggest ischemia, infarct or scarring.  IMPRESSION:  1.  No fixed or reversible/inducible ischemia. 2.  Calculated ejection fraction of 56%. 3.  Normal cardiac wall motion.   Original Report Authenticated By: Malachy Moan, M.D.   Mr Mra Head/brain Wo Cm  04/04/2013   *RADIOLOGY REPORT*   Clinical Data:  Left sided numbness and weakness.  Stroke.  MRI HEAD WITHOUT CONTRAST MRA HEAD WITHOUT CONTRAST  Technique:  Multiplanar, multiecho pulse sequences of the brain and surrounding structures were obtained without intravenous contrast. Angiographic images of the head were obtained using MRA technique without contrast.  Comparison:  CT 04/02/2013  MRI HEAD  Findings:  Negative for acute infarct.  No significant chronic ischemia.  Negative for demyelinating disease.  Cerebral white matter is normal.  Brainstem basal ganglia and cerebellum are normal. Negative for intracranial hemorrhage.  Negative for mass or edema.  Benign lipoma in the scalp over the convexity.  IMPRESSION: Normal MRI of the brain.  MRA HEAD  Findings: Both vertebral arteries are patent to the basilar.  The basilar is widely patent.  Posterior cerebral arteries are patent bilaterally.  Right internal carotid artery is widely patent.  Right anterior and middle cerebral arteries are patent.  Both anterior cerebral arteries supplied from the  right due to hypoplastic left A1 segment.  Left internal carotid artery is widely patent.  The left middle cerebral artery is widely patent.  Hypoplastic left A1 segment.  Negative for cerebral aneurysm.  IMPRESSION: Negative   Original Report Authenticated By: Janeece Riggers, M.D.     Microbiology: No results found for this or any previous visit (from the past 240 hour(s)).   Labs: Basic Metabolic Panel:  Recent Labs Lab 04/02/13 2059 04/02/13 2111 04/04/13 0600 04/05/13 0545  NA 140 145 138 135  K 4.1 4.1 3.7 3.6  CL 106 107 103 99  CO2 24  --  27 25  GLUCOSE 97 97 102* 96  BUN 12 12 13 10   CREATININE 1.42* 1.50* 1.18 1.12  CALCIUM 9.4  --  8.6 9.0   Liver Function Tests:  Recent Labs Lab 04/02/13 2059  AST 46*  ALT 39  ALKPHOS 70  BILITOT 0.3  PROT 7.3  ALBUMIN 3.4*   No results found for this basename: LIPASE, AMYLASE,  in the last 168 hours No results found for  this basename: AMMONIA,  in the last 168 hours CBC:  Recent Labs Lab 04/02/13 2059 04/02/13 2111  WBC 6.6  --   NEUTROABS 3.6  --   HGB 15.8 16.0  HCT 43.4 47.0  MCV 87.7  --   PLT 238  --    Cardiac Enzymes:  Recent Labs Lab 04/02/13 2059 04/03/13 0515 04/03/13 0930 04/03/13 1411  TROPONINI <0.30 <0.30 <0.30 <0.30   BNP: No components found with this basename: POCBNP,  CBG:  Recent Labs Lab 04/04/13 1434 04/04/13 1617 04/04/13 2226 04/05/13 0709 04/05/13 1145  GLUCAP 77 94 93 88 95    Time coordinating discharge:  Greater than 30 minutes  Signed:  Maddux Vanscyoc, DO [facility] Pager: [phone] 04/05/2013, 1:06 PM

## 2013-04-07 LAB — CARDIOLIPIN ANTIBODY: Phospholipids: 205 mg/dL (ref 151–264)

## 2013-04-10 LAB — ANTIPHOSPHOLIPID SYNDROME EVAL, BLD
Anticardiolipin IgM: 16 MPL U/mL — ABNORMAL HIGH (ref <11)
DRVVT: 32.6 secs (ref <42.9)
PTT Lupus Anticoagulant: 31 secs (ref 28.0–43.0)
Phosphatydalserine, IgA: 6 U/mL (ref <20)
Phosphatydalserine, IgG: 10 U/mL (ref <16)

## 2015-12-23 ENCOUNTER — Encounter (HOSPITAL_COMMUNITY): Payer: Self-pay | Admitting: Emergency Medicine

## 2015-12-23 ENCOUNTER — Ambulatory Visit (HOSPITAL_COMMUNITY)
Admission: EM | Admit: 2015-12-23 | Discharge: 2015-12-23 | Disposition: A | Discharge status: Home / Self Care | Payer: Managed Care, Other (non HMO) | Attending: Emergency Medicine | Admitting: Emergency Medicine

## 2015-12-23 DIAGNOSIS — J40 Bronchitis, not specified as acute or chronic: Secondary | ICD-10-CM | POA: Diagnosis not present

## 2015-12-23 DIAGNOSIS — R05 Cough: Secondary | ICD-10-CM | POA: Diagnosis not present

## 2015-12-23 DIAGNOSIS — R0982 Postnasal drip: Secondary | ICD-10-CM | POA: Diagnosis not present

## 2015-12-23 DIAGNOSIS — J9801 Acute bronchospasm: Secondary | ICD-10-CM | POA: Diagnosis not present

## 2015-12-23 DIAGNOSIS — R0600 Dyspnea, unspecified: Secondary | ICD-10-CM

## 2015-12-23 DIAGNOSIS — R059 Cough, unspecified: Secondary | ICD-10-CM

## 2015-12-23 MED ORDER — ALBUTEROL SULFATE HFA 108 (90 BASE) MCG/ACT IN AERS: 2.0000 | INHALATION_SPRAY | RESPIRATORY_TRACT | Status: DC | PRN

## 2015-12-23 MED ORDER — CEFDINIR 300 MG PO CAPS: 300.0000 mg | ORAL_CAPSULE | Freq: Two times a day (BID) | ORAL | Status: DC

## 2015-12-23 MED ORDER — PREDNISONE 20 MG PO TABS: ORAL_TABLET | ORAL | Status: DC

## 2015-12-23 NOTE — ED Notes (Signed)
[facility] a [facility] of chest congestion and a cough for 4 days.

## 2015-12-23 NOTE — ED Provider Notes (Signed)
CSN: [id number]     Arrival date & time 12/23/15  1657 History   First MD Initiated Contact with Patient 12/23/15 1906     Chief Complaint  Patient presents with  . Cough  . Pleurisy   (Consider location/radiation/quality/duration/timing/severity/associated sxs/prior Treatment) HPI Comments: 40 year old male with a two-day history of fever, upper respiratory congestion, runny nose and cough. He states he got little worse yesterday. Cough is worse he has developed shortness of breath increased nasal stuffiness and PND.  Patient is a 40 y.o. male presenting with cough.  Cough Associated symptoms: chest pain, fever, rhinorrhea, shortness of breath and sore throat   Associated symptoms: no diaphoresis, no ear pain and no eye discharge     History reviewed. No pertinent past medical history. Past Surgical History  Procedure Laterality Date  . Cholecystectomy  2009   Family History  Problem Relation Age of Onset  . Cancer Mother   . Stroke Neg Hx     None at early ages  . CAD Neg Hx     None at early ages   Social History  Substance Use Topics  . Smoking status: Never Smoker   . Smokeless tobacco: None  . Alcohol Use: No    Review of Systems  Constitutional: Positive for fever, activity change and appetite change. Negative for diaphoresis and fatigue.  HENT: Positive for postnasal drip, rhinorrhea and sore throat. Negative for ear pain, facial swelling and trouble swallowing.   Eyes: Negative for pain, discharge and redness.  Respiratory: Positive for cough and shortness of breath. Negative for chest tightness.   Cardiovascular: Positive for chest pain.       Chest pain with cough  Gastrointestinal: Negative.   Musculoskeletal: Negative.  Negative for neck pain and neck stiffness.  Neurological: Negative.     Allergies  Review of patient's allergies indicates no known allergies.  Home Medications   Prior to Admission medications   Medication Sig Start Date End Date  Taking? Authorizing Provider  aspirin EC 81 MG tablet Take 1 tablet (81 mg total) by mouth daily. 04/05/13  Yes Catarina Hartshornavid Tat, MD  albuterol (PROVENTIL HFA;VENTOLIN HFA) 108 (90 Base) MCG/ACT inhaler Inhale 2 puffs into the lungs every 4 (four) hours as needed for wheezing or shortness of breath. 12/23/15   Hayden Rasmussenavid Randall Rampersad, NP  cefdinir (OMNICEF) 300 MG capsule Take 1 capsule (300 mg total) by mouth 2 (two) times daily. 12/23/15   Hayden Rasmussenavid Winslow Ederer, NP  predniSONE (DELTASONE) 20 MG tablet 3 Tabs PO Days 1-3, then 2 tabs PO Days 4-6, then 1 tab PO Day 7-9, then Half Tab PO Day 10-12 12/23/15   Hayden Rasmussenavid Jermanie Minshall, NP  UNABLE TO FIND Mr. Jenelle [facility]agesHairston [facility] 04/05/13.  He is medically stable to return to work. 04/05/13   Catarina Hartshornavid Tat, MD   Meds Ordered and Administered this Visit  Medications - No data to display  BP 153/94 mmHg  Pulse 72  Temp(Src) 99 F (37.2 C) (Oral)  Resp 12  SpO2 98% No data found.   Physical Exam  Constitutional: He is oriented to person, place, and time. He appears well-developed and well-nourished. No distress.  HENT:  Mouth/Throat: No oropharyngeal exudate.  ilateral TMs are normal. Oropharynx with erythema. Mild PND.No exudates or swelling.  Eyes: EOM are normal.  Lower conjunctiva with minor erythema.  Neck: Normal range of motion. Neck supple.  Cardiovascular: Normal rate, regular rhythm and normal heart sounds.   Pulmonary/Chest: Effort normal.  No respiratory distress. He has wheezes. He has no rales. He exhibits tenderness.  Expiratory wheezes, increased wheeze with cough Only prolonged expiratory phase. Otherwise good air movement.  Musculoskeletal: Normal range of motion. He exhibits no edema.  Lymphadenopathy:    He has no cervical adenopathy.  Neurological: He is alert and oriented to person, place, and time.  Skin: Skin is warm and dry. No rash noted.  Psychiatric: He has a normal mood and affect.  Nursing note and vitals reviewed.   ED  Course  Procedures (including critical care time)  Labs Review Labs Reviewed - No data to display  Imaging Review No results found.   Visual Acuity Review  Right Eye Distance:   Left Eye Distance:   Bilateral Distance:    Right Eye Near:   Left Eye Near:    Bilateral Near:         MDM   1. Bronchitis   2. Bronchospasm   3. PND (post-nasal drip)   4. Cough   5. Dyspnea    For nasal and head congestion may take Sudafed PE 10 mg every 4 hours as needed. Saline nasal spray used frequently. For drainage may use Allegra, Claritin or Zyrtec. If you need stronger medicine to stop drainage may take Chlor-Trimeton 2-4 mg every 4 hours. This may cause drowsiness. Ibuprofen 600 mg every 6 hours as needed for pain, discomfort or fever. Drink plenty of fluids and stay well-hydrated. USe flonase or rhinocort nasal spray for next few weeks if symptoms persists Meds ordered this encounter  Medications  . albuterol (PROVENTIL HFA;VENTOLIN HFA) 108 (90 Base) MCG/ACT inhaler    Sig: Inhale 2 puffs into the lungs every 4 (four) hours as needed for wheezing or shortness of breath.    Dispense:  1 Inhaler    Refill:  0    Order Specific Question:  Supervising Provider    Answer:  Charm Rings [id number]  . predniSONE (DELTASONE) 20 MG tablet    Sig: 3 Tabs PO Days 1-3, then 2 tabs PO Days 4-6, then 1 tab PO Day 7-9, then Half Tab PO Day 10-12    Dispense:  20 tablet    Refill:  0    Order Specific Question:  Supervising Provider    Answer:  Charm Rings [id number]  . cefdinir (OMNICEF) 300 MG capsule    Sig: Take 1 capsule (300 mg total) by mouth 2 (two) times daily.    Dispense:  14 capsule    Refill:  0    Order Specific Question:  Supervising Provider    Answer:  Charm Rings [[id number]]       Hayden Rasmussen, NP 12/23/15 1920

## 2015-12-23 NOTE — Discharge Instructions (Signed)
For nasal and head congestion may take Sudafed PE 10 mg every 4 hours as needed. Saline nasal spray used frequently. For drainage may use Allegra, Claritin or Zyrtec. If you need stronger medicine to stop drainage may take Chlor-Trimeton 2-4 mg every 4 hours. This may cause drowsiness. Ibuprofen 600 mg every 6 hours as needed for pain, discomfort or fever. Drink plenty of fluids and stay well-hydrated. USe flonase or rhinocort nasal spray for next few weeks if symptoms persists    Bronchospasm, Adult A bronchospasm is when the tubes that carry air in and out of your lungs (airways) spasm or tighten. During a bronchospasm it is hard to breathe. This is because the airways get smaller. A bronchospasm can be triggered by:  Allergies. These may be to animals, pollen, food, or mold.  Infection. This is a common cause of bronchospasm.  Exercise.  Irritants. These include pollution, cigarette smoke, strong odors, aerosol sprays, and paint fumes.  Weather changes.  Stress.  Being emotional. HOME CARE   Always have a plan for getting help. Know when to call your doctor and local emergency services (911 in the [place] Know where you can get emergency care.  Only take medicines as told by your doctor.  If you were prescribed an inhaler or nebulizer machine, ask your doctor how to use it correctly. Always use a spacer with your inhaler if you were given one.  Stay calm during an attack. Try to relax and breathe more slowly.  Control your home environment:  Change your heating and air conditioning filter at least once a month.  Limit your use of fireplaces and wood stoves.  Do not  smoke. Do not  allow smoking in your home.  Avoid perfumes and fragrances.  Get rid of pests (such as roaches and mice) and their droppings.  Throw away plants if you see mold on them.  Keep your house clean and dust free.  Replace carpet with wood, tile, or vinyl flooring. Carpet can trap dander and  dust.  Use allergy-proof pillows, mattress covers, and box spring covers.  Wash bed sheets and blankets every week in hot water. Dry them in a dryer.  Use blankets that are made of polyester or cotton.  Wash hands frequently. GET HELP IF:  You have muscle aches.  You have chest pain.  The thick spit you spit or cough up (sputum) changes from clear or white to yellow, green, gray, or bloody.  The thick spit you spit or cough up gets thicker.  There are problems that may be related to the medicine you are given such as:  A rash.  Itching.  Swelling.  Trouble breathing. GET HELP RIGHT AWAY IF:  You feel you cannot breathe or catch your breath.  You cannot stop coughing.  Your treatment is not helping you breathe better.  You have very bad chest pain. MAKE SURE YOU:   Understand these instructions.  Will watch your condition.  Will get help right away if you are not doing well or get worse.   This information is not intended to replace advice given to you by your health care provider. Make sure you discuss any questions you have with your health care provider.   Document Released: 06/11/2009 Document Revised: 09/04/2014 Document Reviewed: 02/04/2013 Elsevier Interactive Patient Education 2016 [employer] Inc.  Cough, Adult A cough helps to clear your throat and lungs. A cough may last only 2-3 weeks (acute), or it may last longer than 8 weeks (chronic).  Many different things can cause a cough. A cough may be a sign of an illness or another medical condition. HOME CARE  Pay attention to any changes in your cough.  Take medicines only as told by your doctor.  If you were prescribed an antibiotic medicine, take it as told by your doctor. Do not stop taking it even if you start to feel better.  Talk with your doctor before you try using a cough medicine.  Drink enough fluid to keep your pee (urine) clear or pale yellow.  If the air is dry, use a cold steam  vaporizer or humidifier in your home.  Stay away from things that make you cough at work or at home.  If your cough is worse at night, try using extra pillows to raise your head up higher while you sleep.  Do not smoke, and try not to be around smoke. If you need help quitting, ask your doctor.  Do not have caffeine.  Do not drink alcohol.  Rest as needed. GET HELP IF:  You have new problems (symptoms).  You cough up yellow fluid (pus).  Your cough does not get better after 2-3 weeks, or your cough gets worse.  Medicine does not help your cough and you are not sleeping well.  You have pain that gets worse or pain that is not helped with medicine.  You have a fever.  You are losing weight and you do not know why.  You have night sweats. GET HELP RIGHT AWAY IF:  You cough up blood.  You have trouble breathing.  Your heartbeat is very fast.   This information is not intended to replace advice given to you by your health care provider. Make sure you discuss any questions you have with your health care provider.   Document Released: 04/27/2011 Document Revised: 05/05/2015 Document Reviewed: 10/21/2014 Elsevier Interactive Patient Education 2016 [employer]  How to Use an Inhaler Using your inhaler correctly is very important. Good technique will make sure that the medicine reaches your lungs.  HOW TO USE AN INHALER:  Take the cap off the inhaler.  If this is the first time using your inhaler, you need to prime it. Shake the inhaler for 5 seconds. Release four puffs into the air, away from your face. Ask your doctor for help if you have questions.  Shake the inhaler for 5 seconds.  Turn the inhaler so the bottle is above the mouthpiece.  Put your pointer finger on top of the bottle. Your thumb holds the bottom of the inhaler.  Open your mouth.  Either hold the inhaler away from your mouth (the width of 2 fingers) or place your lips tightly around the  mouthpiece. Ask your doctor which way to use your inhaler.  Breathe out as much air as possible.  Breathe in and push down on the bottle 1 time to release the medicine. You will feel the medicine go in your mouth and throat.  Continue to take a deep breath in very slowly. Try to fill your lungs.  After you have breathed in completely, hold your breath for 10 seconds. This will help the medicine to settle in your lungs. If you cannot hold your breath for 10 seconds, hold it for as long as you can before you breathe out.  Breathe out slowly, through pursed lips. Whistling is an example of pursed lips.  If your doctor has told you to take more than 1 puff, wait at least 15-30  seconds between puffs. This will help you get the best results from your medicine. Do not use the inhaler more than your doctor tells you to.  Put the cap back on the inhaler.  Follow the directions from your doctor or from the inhaler package about cleaning the inhaler. If you use more than one inhaler, ask your doctor which inhalers to use and what order to use them in. Ask your doctor to help you figure out when you will need to refill your inhaler.  If you use a steroid inhaler, always rinse your mouth with water after your last puff, gargle and spit out the water. Do not swallow the water. GET HELP IF:  The inhaler medicine only partially helps to stop wheezing or shortness of breath.  You are having trouble using your inhaler.  You have some increase in thick spit (phlegm). GET HELP RIGHT AWAY IF:  The inhaler medicine does not help your wheezing or shortness of breath or you have tightness in your chest.  You have dizziness, headaches, or fast heart rate.  You have chills, fever, or night sweats.  You have a large increase of thick spit, or your thick spit is bloody. MAKE SURE YOU:   Understand these instructions.  Will watch your condition.  Will get help right away if you are not doing well or get  worse.   This information is not intended to replace advice given to you by your health care provider. Make sure you discuss any questions you have with your health care provider.   Document Released: 05/23/2008 Document Revised: 06/04/2013 Document Reviewed: 03/13/2013 Elsevier Interactive Patient Education 2016 Else[employer] Inc.  Upper Respiratory Infection, Adult Most upper respiratory infections (URIs) are a viral infection of the air passages leading to the lungs. A URI affects the nose, throat, and upper air passages. The most common type of URI is nasopharyngitis and is typically referred to as "the common cold." URIs run their course and usually go away on their own. Most of the time, a URI does not require medical attention, but sometimes a bacterial infection in the upper airways can follow a viral infection. This is called a secondary infection. Sinus and middle ear infections are common types of secondary upper respiratory infections. Bacterial pneumonia can also complicate a URI. A URI can worsen asthma and chronic obstructive pulmonary disease (COPD). Sometimes, these complications can require emergency medical care and may be life threatening.  CAUSES Almost all URIs are caused by viruses. A virus is a type of germ and can spread from one person to another.  RISKS FACTORS You may be at risk for a URI if:   You smoke.   You have chronic heart or lung disease.  You have a weakened defense (immune) system.   You are very young or very old.   You have nasal allergies or asthma.  You work in crowded or poorly ventilated areas.  You work in health care facilities or schools. SIGNS AND SYMPTOMS  Symptoms typically develop 2-3 days after you come in contact with a cold virus. Most viral URIs last 7-10 days. However, viral URIs from the influenza virus (flu virus) can last 14-18 days and are typically more severe. Symptoms may include:   Runny or stuffy (congested) nose.    Sneezing.   Cough.   Sore throat.   Headache.   Fatigue.   Fever.   Loss of appetite.   Pain in your forehead, behind your eyes, and over your  cheekbones (sinus pain).  Muscle aches.  DIAGNOSIS  Your health care provider may diagnose a URI by:  Physical exam.  Tests to check that your symptoms are not due to another condition such as:  Strep throat.  Sinusitis.  Pneumonia.  Asthma. TREATMENT  A URI goes away on its own with time. It cannot be cured with medicines, but medicines may be prescribed or recommended to relieve symptoms. Medicines may help:  Reduce your fever.  Reduce your cough.  Relieve nasal congestion. HOME CARE INSTRUCTIONS   Take medicines only as directed by your health care provider.   Gargle warm saltwater or take cough drops to comfort your throat as directed by your health care provider.  Use a warm mist humidifier or inhale steam from a shower to increase air moisture. This may make it easier to breathe.  Drink enough fluid to keep your urine clear or pale yellow.   Eat soups and other clear broths and maintain good nutrition.   Rest as needed.   Return to work when your temperature has returned to normal or as your health care provider advises. You may need to stay home longer to avoid infecting others. You can also use a face mask and careful hand washing to prevent spread of the virus.  Increase the usage of your inhaler if you have asthma.   Do not use any tobacco products, including cigarettes, chewing tobacco, or electronic cigarettes. If you need help quitting, ask your health care provider. PREVENTION  The best way to protect yourself from getting a cold is to practice good hygiene.   Avoid oral or hand contact with people with cold symptoms.   Wash your hands often if contact occurs.  There is no clear evidence that vitamin C, vitamin E, echinacea, or exercise reduces the chance of developing a cold.  However, it is always recommended to get plenty of rest, exercise, and practice good nutrition.  SEEK MEDICAL CARE IF:   You are getting worse rather than better.   Your symptoms are not controlled by medicine.   You have chills.  You have worsening shortness of breath.  You have brown or red mucus.  You have yellow or brown nasal discharge.  You have pain in your face, especially when you bend forward.  You have a fever.  You have swollen neck glands.  You have pain while swallowing.  You have white areas in the back of your throat. SEEK IMMEDIATE MEDICAL CARE IF:   You have severe or persistent:  Headache.  Ear pain.  Sinus pain.  Chest pain.  You have chronic lung disease and any of the following:  Wheezing.  Prolonged cough.  Coughing up blood.  A change in your usual mucus.  You have a stiff neck.  You have changes in your:  Vision.  Hearing.  Thinking.  Mood. MAKE SURE YOU:   Understand these instructions.  Will watch your condition.  Will get help right away if you are not doing well or get worse.   This information is not intended to replace advice given to you by your health care provider. Make sure you discuss any questions you have with your health care provider.   Document Released: 02/07/2001 Document Revised: 12/29/2014 Document Reviewed: 11/19/2013 Elsevier Interactive Patient Education [employer]

## 2015-12-30 ENCOUNTER — Telehealth (HOSPITAL_COMMUNITY): Payer: Self-pay | Admitting: Emergency Medicine

## 2015-12-30 NOTE — ED Notes (Signed)
Pt called wanting for Teena Iraniavid M, NP to fill out FMLA forms for the days he missed... Reports he was seen here on 4/27 and was given a note to return on 4/30... Pt reports he left work early on Tuesday, was out sick on Wednesday and came in to see us on Thursday 4/27... Reports his work schedule ends on Thursday and begins on Sunday... States he only missed 2.5 days of work but his job requires him to have FMLA forms... Notified pt that we do not fill those forms... Asked Dr. Piedad ClimesHonig and she concurred... Had pt speak w/Melissa B, RN and she adv pt f/u w/PCP.

## 2018-05-15 ENCOUNTER — Other Ambulatory Visit
Admission: RE | Admit: 2018-05-15 | Discharge: 2018-05-15 | Disposition: A | Discharge status: Home / Self Care | Payer: Worker's Compensation | Source: Ambulatory Visit | Attending: Family Medicine | Admitting: Family Medicine

## 2018-05-15 NOTE — ED Notes (Signed)
Worker's Comp: Pt arrives to ED with supervisor [employer] [place], requesting urine drug screen and blood alcohol test.  Employer profile pulled and specific blood alcohol request verified by first nurse Raquel and task delegated to this tech.  [facility] and [facility] consent obtained and filed with chart.  Urine specimen (ID no [id number]) and blood specimen (ID no [id number]) obtained and released to lab via chain of custody protocol

## 2019-06-19 ENCOUNTER — Encounter: Payer: Self-pay | Admitting: Nurse Practitioner

## 2019-06-19 ENCOUNTER — Other Ambulatory Visit: Payer: Self-pay

## 2019-06-19 ENCOUNTER — Ambulatory Visit (INDEPENDENT_AMBULATORY_CARE_PROVIDER_SITE_OTHER): Payer: Commercial Managed Care - PPO | Admitting: Nurse Practitioner

## 2019-06-19 VITALS — BP 120/84 | HR 88 | Temp 98.5°F | Ht 69.8 in | Wt 342.6 lb

## 2019-06-19 DIAGNOSIS — Z23 Encounter for immunization: Secondary | ICD-10-CM

## 2019-06-19 DIAGNOSIS — M25511 Pain in right shoulder: Secondary | ICD-10-CM

## 2019-06-19 MED ORDER — KETOROLAC TROMETHAMINE 60 MG/2ML IM SOLN
60.0000 mg | Freq: Once | INTRAMUSCULAR | Status: AC
  Administered 2019-06-19: 60 mg via INTRAMUSCULAR

## 2019-06-19 MED ORDER — TRAMADOL HCL 50 MG PO TABS: 50.0000 mg | ORAL_TABLET | Freq: Four times a day (QID) | ORAL | 0 refills | Status: DC | PRN

## 2019-06-19 NOTE — Progress Notes (Signed)
Subjective:     Patient ID: Anthony Delgado , male    DOB: Jun 20, 1976 , 43 y.o.   MRN: [id number]   Chief Complaint  Patient presents with  . Shoulder Pain    patient stated his right shoulder has been hurting since sunday. he stated the pain radiates down his arm it hurts him to pick anything up    HPI  Sunday  was moving furniture around and felt pain but thought would pass and then on Monday he had done the same at work moving items around and he heard a "pop" dropping to his knees.  He is unable to use his right arm or be able to lift and hold on to items.  He has taken Ibuprofen and bayer ASA without relief.  Has tingling to his right hand. Shoulder Pain  The pain is present in the right shoulder. This is a new problem. The current episode started in the past 7 days. There has been a history of trauma. The problem occurs constantly. The problem has been gradually worsening. The quality of the pain is described as aching (tingling). The pain is severe. Associated symptoms include a limited range of motion (unable to extend) and tingling. Pertinent negatives include no fever, joint swelling or numbness. He has tried NSAIDS (He is sleeping sitting up in the chair due to pain) for the symptoms. The treatment provided no relief. There is no history of osteoarthritis.     No past medical history on file.   Family History  Problem Relation Age of Onset  . Cancer Mother   . Stroke Neg Hx        None at early ages  . CAD Neg Hx        None at early ages     Current Outpatient Medications:  .  albuterol (PROVENTIL HFA;VENTOLIN HFA) 108 (90 Base) MCG/ACT inhaler, Inhale 2 puffs into the lungs every 4 (four) hours as needed for wheezing or shortness of breath., Disp: 1 Inhaler, Rfl: 0 .  aspirin EC 81 MG tablet, Take 1 tablet (81 mg total) by mouth daily. (Patient not taking: Reported on 06/19/2019), Disp: , Rfl:  .  UNABLE TO FIND, Mr. Freel was admitted to [facility] from  04/02/13 to 04/05/13.  He is medically stable to return to work. (Patient not taking: Reported on 06/19/2019), Disp: 1 Act, Rfl: 0   No Known Allergies   Review of Systems  Constitutional: Negative.  Negative for fever.  Respiratory: Negative.   Cardiovascular: Negative.  Negative for chest pain, palpitations and leg swelling.  Musculoskeletal: Negative for arthralgias.       Right shoulder pain and unable to use lifting items  Neurological: Positive for tingling. Negative for dizziness, numbness and headaches.  Psychiatric/Behavioral: Negative.      Today's Vitals   06/19/19 1605  BP: 120/84  Pulse: 88  Temp: 98.5 F (36.9 C)  TempSrc: Oral  Weight: (!) 342 lb 9.6 oz (155.4 kg)  Height: 5' 9.8" (1.773 m)  PainSc: 6   PainLoc: Shoulder   Body mass index is 49.44 kg/m.   Objective:  Physical Exam Constitutional:      Appearance: Normal appearance. He is obese.  Pulmonary:     Effort: Pulmonary effort is normal.  Musculoskeletal:        General: Tenderness (right shoulder anterior and posterior joint space) and deformity (shoulders look uneven) present. No swelling.     Comments: Decreased range of motion and increased pain  with flexion and extension.  Positive Neers  Skin:    General: Skin is warm and dry.  Neurological:     General: No focal deficit present.     Mental Status: He is alert and oriented to person, place, and time.         Assessment And Plan:     1. Acute pain of right shoulder  Decreased range of motion, with tenderness to posterior and anterior joint space  Also has slight uneven to right shoulder   I have referred him to [facility] after hours [facility] - ketorolac (TORADOL) injection 60 mg - traMADol (ULTRAM) 50 MG tablet; Take 1 tablet (50 mg total) by mouth every 6 (six) hours as needed.  Dispense: 20 tablet; Refill: 0 - Ambulatory referral to Orthopedic Surgery  2. Need for influenza vaccination  Influenza vaccine given in  office  Advised to take Tylenol as needed for muscle aches or fever - Flu Vaccine QUAD 6+ mos PF IM (Fluarix Quad PF)    Minette Brine, FNP    THE PATIENT IS ENCOURAGED TO PRACTICE SOCIAL DISTANCING DUE TO THE COVID-19 PANDEMIC.

## 2019-06-19 NOTE — Patient Instructions (Signed)
Shoulder Pain Many things can cause shoulder pain, including:  An injury.  Moving the shoulder in the same way again and again (overuse).  Joint pain (arthritis). Pain can come from:  Swelling and irritation (inflammation) of any part of the shoulder.  An injury to the shoulder joint.  An injury to: ? Tissues that connect muscle to bone (tendons). ? Tissues that connect bones to each other (ligaments). ? Bones. Follow these instructions at home: Watch for changes in your symptoms. Let your doctor know about them. Follow these instructions to help with your pain. If you have a sling:  Wear the sling as told by your doctor. Remove it only as told by your doctor.  Loosen the sling if your fingers: ? Tingle. ? Become numb. ? Turn cold and blue.  Keep the sling clean.  If the sling is not waterproof: ? Do not let it get wet. ? Take the sling off when you shower or bathe. Managing pain, stiffness, and swelling   If told, put ice on the painful area: ? Put ice in a plastic bag. ? Place a towel between your skin and the bag. ? Leave the ice on for 20 minutes, 2-3 times a day. Stop putting ice on if it does not help with the pain.  Squeeze a soft ball or a foam pad as much as possible. This prevents swelling in the shoulder. It also helps to strengthen the arm. General instructions  Take over-the-counter and prescription medicines only as told by your doctor.  Keep all follow-up visits as told by your doctor. This is important. Contact a doctor if:  Your pain gets worse.  Medicine does not help your pain.  You have new pain in your arm, hand, or fingers. Get help right away if:  Your arm, hand, or fingers: ? Tingle. ? Are numb. ? Are swollen. ? Are painful. ? Turn white or blue. Summary  Shoulder pain can be caused by many things. These include injury, moving the shoulder in the same away again and again, and joint pain.  Watch for changes in your symptoms.  Let your doctor know about them.  This condition may be treated with a sling, ice, and pain medicine.  Contact your doctor if the pain gets worse or you have new pain. Get help right away if your arm, hand, or fingers tingle or get numb, swollen, or painful.  Keep all follow-up visits as told by your doctor. This is important. This information is not intended to replace advice given to you by your health care provider. Make sure you discuss any questions you have with your health care provider. Document Released: 01/31/2008 Document Revised: 02/26/2018 Document Reviewed: 02/26/2018 Elsevier Patient Education  2020 Elsevier Inc.  

## 2019-06-26 ENCOUNTER — Other Ambulatory Visit: Payer: Self-pay | Admitting: Sports Medicine

## 2019-06-26 DIAGNOSIS — M25511 Pain in right shoulder: Secondary | ICD-10-CM

## 2019-07-17 ENCOUNTER — Other Ambulatory Visit: Payer: Self-pay

## 2019-07-17 ENCOUNTER — Ambulatory Visit
Admission: RE | Admit: 2019-07-17 | Discharge: 2019-07-17 | Disposition: A | Discharge status: Home / Self Care | Payer: Commercial Managed Care - PPO | Source: Ambulatory Visit | Attending: Sports Medicine | Admitting: Sports Medicine

## 2019-07-17 DIAGNOSIS — M25511 Pain in right shoulder: Secondary | ICD-10-CM

## 2019-12-15 ENCOUNTER — Ambulatory Visit (INDEPENDENT_AMBULATORY_CARE_PROVIDER_SITE_OTHER): Payer: Commercial Managed Care - PPO | Admitting: Nurse Practitioner

## 2019-12-15 ENCOUNTER — Other Ambulatory Visit: Payer: Self-pay

## 2019-12-15 ENCOUNTER — Encounter: Payer: Self-pay | Admitting: Nurse Practitioner

## 2019-12-15 VITALS — BP 138/88 | HR 98 | Temp 98.6°F | Ht 69.0 in | Wt 332.4 lb

## 2019-12-15 DIAGNOSIS — L03311 Cellulitis of abdominal wall: Secondary | ICD-10-CM

## 2019-12-15 DIAGNOSIS — L02221 Furuncle of abdominal wall: Secondary | ICD-10-CM | POA: Diagnosis not present

## 2019-12-15 DIAGNOSIS — L0292 Furuncle, unspecified: Secondary | ICD-10-CM

## 2019-12-15 DIAGNOSIS — L03818 Cellulitis of other sites: Secondary | ICD-10-CM

## 2019-12-15 MED ORDER — CEFTRIAXONE SODIUM 1 G IJ SOLR
1.0000 g | Freq: Once | INTRAMUSCULAR | Status: AC
  Administered 2019-12-15: 17:00:00 1 g via INTRAMUSCULAR

## 2019-12-15 MED ORDER — CEPHALEXIN 500 MG PO CAPS: 500.0000 mg | ORAL_CAPSULE | Freq: Four times a day (QID) | ORAL | 0 refills | Status: AC

## 2019-12-15 NOTE — Progress Notes (Signed)
This visit occurred during the SARS-CoV-2 public health emergency.  Safety protocols were in place, including screening questions prior to the visit, additional usage of staff PPE, and extensive cleaning of exam room while observing appropriate contact time as indicated for disinfecting solutions.  Subjective:     Patient ID: Anthony Delgado , male    DOB: 04-01-1976 , 44 y.o.   MRN: [id number]   Chief Complaint  Patient presents with  . Recurrent Skin Infections    HPI  Wt Readings from Last 3 Encounters: 12/15/19 : (!) 332 lb 6.4 oz (150.8 kg) 06/19/19 : (!) 342 lb 9.6 oz (155.4 kg) 04/02/13 : 290 lb 1.6 oz (131.6 kg)  Has abdomen boil and pain to abdomen 3 days ago. Area has opened up.   Last time he had to have IV antibiotics (ancef and clindamyacin).  Denies any open area prior to this event today.     History reviewed. No pertinent past medical history.   Family History  Problem Relation Age of Onset  . Cancer Mother   . Stroke Neg Hx        None at early ages  . CAD Neg Hx        None at early ages     Current Outpatient Medications:  .  albuterol (PROVENTIL HFA;VENTOLIN HFA) 108 (90 Base) MCG/ACT inhaler, Inhale 2 puffs into the lungs every 4 (four) hours as needed for wheezing or shortness of breath., Disp: 1 Inhaler, Rfl: 0 .  traMADol (ULTRAM) 50 MG tablet, Take 1 tablet (50 mg total) by mouth every 6 (six) hours as needed., Disp: 20 tablet, Rfl: 0 .  aspirin EC 81 MG tablet, Take 1 tablet (81 mg total) by mouth daily. (Patient not taking: Reported on 06/19/2019), Disp: , Rfl:  .  UNABLE TO FIND, Mr. Mcmartin was admitted to [facility] from 04/02/13 to 04/05/13.  He is medically stable to return to work. (Patient not taking: Reported on 06/19/2019), Disp: 1 Act, Rfl: 0   No Known Allergies   Review of Systems  Constitutional: Negative.   Respiratory: Negative.   Cardiovascular: Negative for chest pain, palpitations and leg swelling.  Skin: Positive for  wound.  Neurological: Negative for dizziness and headaches.  Psychiatric/Behavioral: Negative.      Today's Vitals   12/15/19 1546  BP: 138/88  Pulse: 98  Temp: 98.6 F (37 C)  Weight: (!) 332 lb 6.4 oz (150.8 kg)  Height: 5' 9" (1.753 m)   Body mass index is 49.09 kg/m.   Objective:  Physical Exam Constitutional:      Appearance: Normal appearance.  Cardiovascular:     Rate and Rhythm: Normal rate and regular rhythm.     Pulses: Normal pulses.  Pulmonary:     Effort: Pulmonary effort is normal. No respiratory distress.     Breath sounds: Normal breath sounds.  Skin:    Comments: Wound with open area, erythema and pain to abdomen. He has firm area just below his umbilicus  Neurological:     General: No focal deficit present.     Mental Status: He is alert and oriented to person, place, and time.     Cranial Nerves: No cranial nerve deficit.  Psychiatric:        Mood and Affect: Mood normal.        Behavior: Behavior normal.        Thought Content: Thought content normal.        Judgment: Judgment normal.  Assessment And Plan:     1. Boils   Firm area noted to lower abdomen with small open area, cleansed with NS and applied nonstick telfa.   Rocephin 1 gm IM given in office  Also sent cephalexin for him to start tomorrow  Will take him out of work for the next 2 days until his follow up - CMP14+EGFR - Lipid panel - Hemoglobin A1c - CBC with Differential/Platelet - cefTRIAXone (ROCEPHIN) injection 1 g - cephALEXin (KEFLEX) 500 MG capsule; Take 1 capsule (500 mg total) by mouth 4 (four) times daily for 10 days.  Dispense: 40 capsule; Refill: 0 - US Abdomen Complete; Future  2. Cellulitis of other specified site  Hot to touch to his lower abdomen and right side  - cefTRIAXone (ROCEPHIN) injection 1 g - cephALEXin (KEFLEX) 500 MG capsule; Take 1 capsule (500 mg total) by mouth 4 (four) times daily for 10 days.  Dispense: 40 capsule; Refill: 0 - US  Abdomen Complete; Future    Minette Brine, FNP    THE PATIENT IS ENCOURAGED TO PRACTICE SOCIAL DISTANCING DUE TO THE COVID-19 PANDEMIC.

## 2019-12-16 LAB — CMP14+EGFR
ALT: 26 IU/L (ref 0–44)
AST: 25 IU/L (ref 0–40)
Albumin/Globulin Ratio: 1 — ABNORMAL LOW (ref 1.2–2.2)
Albumin: 4 g/dL (ref 4.0–5.0)
Alkaline Phosphatase: 87 IU/L (ref 39–117)
BUN/Creatinine Ratio: 9 (ref 9–20)
BUN: 11 mg/dL (ref 6–24)
Bilirubin Total: 0.6 mg/dL (ref 0.0–1.2)
CO2: 20 mmol/L (ref 20–29)
Calcium: 8.9 mg/dL (ref 8.7–10.2)
Chloride: 101 mmol/L (ref 96–106)
Creatinine, Ser: 1.2 mg/dL (ref 0.76–1.27)
GFR calc Af Amer: 83 mL/min/{1.73_m2} (ref >59)
GFR calc non Af Amer: 72 mL/min/{1.73_m2} (ref >59)
Globulin, Total: 3.9 g/dL (ref 1.5–4.5)
Glucose: 86 mg/dL (ref 65–99)
Potassium: 3.9 mmol/L (ref 3.5–5.2)
Sodium: 137 mmol/L (ref 134–144)
Total Protein: 7.9 g/dL (ref 6.0–8.5)

## 2019-12-16 LAB — CBC WITH DIFFERENTIAL/PLATELET
Basophils Absolute: 0 10*3/uL (ref 0.0–0.2)
Basos: 0 %
EOS (ABSOLUTE): 0.1 10*3/uL (ref 0.0–0.4)
Eos: 2 %
Hematocrit: 48.5 % (ref 37.5–51.0)
Hemoglobin: 17 g/dL (ref 13.0–17.7)
Immature Grans (Abs): 0 10*3/uL (ref 0.0–0.1)
Immature Granulocytes: 0 %
Lymphocytes Absolute: 2.3 10*3/uL (ref 0.7–3.1)
Lymphs: 34 %
MCH: 31.2 pg (ref 26.6–33.0)
MCHC: 35.1 g/dL (ref 31.5–35.7)
MCV: 89 fL (ref 79–97)
Monocytes Absolute: 0.6 10*3/uL (ref 0.1–0.9)
Monocytes: 9 %
Neutrophils Absolute: 3.7 10*3/uL (ref 1.4–7.0)
Neutrophils: 55 %
Platelets: 254 10*3/uL (ref 150–450)
RBC: 5.45 x10E6/uL (ref 4.14–5.80)
RDW: 13.9 % (ref 11.6–15.4)
WBC: 6.9 10*3/uL (ref 3.4–10.8)

## 2019-12-16 LAB — LIPID PANEL
Chol/HDL Ratio: 5.5 ratio — ABNORMAL HIGH (ref 0.0–5.0)
Cholesterol, Total: 172 mg/dL (ref 100–199)
HDL: 31 mg/dL — ABNORMAL LOW (ref >39)
LDL Chol Calc (NIH): 122 mg/dL — ABNORMAL HIGH (ref 0–99)
Triglycerides: 105 mg/dL (ref 0–149)
VLDL Cholesterol Cal: 19 mg/dL (ref 5–40)

## 2019-12-16 LAB — HEMOGLOBIN A1C
Est. average glucose Bld gHb Est-mCnc: 126 mg/dL
Hgb A1c MFr Bld: 6 % — ABNORMAL HIGH (ref 4.8–5.6)

## 2019-12-17 ENCOUNTER — Encounter: Payer: Self-pay | Admitting: Nurse Practitioner

## 2019-12-17 ENCOUNTER — Other Ambulatory Visit: Payer: Self-pay

## 2019-12-17 ENCOUNTER — Ambulatory Visit (INDEPENDENT_AMBULATORY_CARE_PROVIDER_SITE_OTHER): Payer: Commercial Managed Care - PPO | Admitting: Nurse Practitioner

## 2019-12-17 VITALS — BP 122/80 | HR 73 | Temp 98.7°F | Ht 69.4 in | Wt 333.0 lb

## 2019-12-17 DIAGNOSIS — L0292 Furuncle, unspecified: Secondary | ICD-10-CM

## 2019-12-17 DIAGNOSIS — L03311 Cellulitis of abdominal wall: Secondary | ICD-10-CM | POA: Diagnosis not present

## 2019-12-17 DIAGNOSIS — L03818 Cellulitis of other sites: Secondary | ICD-10-CM

## 2019-12-17 DIAGNOSIS — L02221 Furuncle of abdominal wall: Secondary | ICD-10-CM

## 2019-12-17 NOTE — Progress Notes (Signed)
This visit occurred during the SARS-CoV-2 public health emergency.  Safety protocols were in place, including screening questions prior to the visit, additional usage of staff PPE, and extensive cleaning of exam room while observing appropriate contact time as indicated for disinfecting solutions.  Subjective:     Patient ID: Anthony Delgado , male    DOB: May 06, 1976 , 44 y.o.   MRN: [id number]   Chief Complaint  Patient presents with  . Recurrent Skin Infections    HPI   Here today for recheck of his abdomen infection and pain. Continues to have abdominal pain but is better.  He works with lifting boxes at work.     History reviewed. No pertinent past medical history.   Family History  Problem Relation Age of Onset  . Cancer Mother   . Stroke Neg Hx        None at early ages  . CAD Neg Hx        None at early ages     Current Outpatient Medications:  .  albuterol (PROVENTIL HFA;VENTOLIN HFA) 108 (90 Base) MCG/ACT inhaler, Inhale 2 puffs into the lungs every 4 (four) hours as needed for wheezing or shortness of breath., Disp: 1 Inhaler, Rfl: 0 .  cephALEXin (KEFLEX) 500 MG capsule, Take 1 capsule (500 mg total) by mouth 4 (four) times daily for 10 days., Disp: 40 capsule, Rfl: 0 .  traMADol (ULTRAM) 50 MG tablet, Take 1 tablet (50 mg total) by mouth every 6 (six) hours as needed., Disp: 20 tablet, Rfl: 0 .  aspirin EC 81 MG tablet, Take 1 tablet (81 mg total) by mouth daily. (Patient not taking: Reported on 06/19/2019), Disp: , Rfl:  .  UNABLE TO FIND, [name] was admitted to [facility] from 04/02/13 to 04/05/13.  He is medically stable to return to work. (Patient not taking: Reported on 06/19/2019), Disp: 1 Act, Rfl: 0   No Known Allergies   Review of Systems  Constitutional: Negative.   Respiratory: Negative.   Cardiovascular: Negative for chest pain, palpitations and leg swelling.  Gastrointestinal: Negative for abdominal distention.       Lower abdomen  tenderness with   Skin: Positive for wound.  Neurological: Negative for dizziness and headaches.  Psychiatric/Behavioral: Negative.      Today's Vitals   12/17/19 1058  BP: 122/80  Pulse: 73  Temp: 98.7 F (37.1 C)  TempSrc: Oral  Weight: (!) 333 lb (151 kg)  Height: 5' 9.4" (1.763 m)  PainSc: 5   PainLoc: Abdomen   Body mass index is 48.61 kg/m.   Objective:  Physical Exam Constitutional:      Appearance: Normal appearance.  Cardiovascular:     Rate and Rhythm: Normal rate and regular rhythm.     Pulses: Normal pulses.  Pulmonary:     Effort: Pulmonary effort is normal. No respiratory distress.     Breath sounds: Normal breath sounds.  Skin:    General: Skin is warm.     Capillary Refill: Capillary refill takes less than 2 seconds.     Comments: Wound with open area, erythema and pain to abdomen. He has firm area just below his umbilicus  Neurological:     General: No focal deficit present.     Mental Status: He is alert and oriented to person, place, and time.     Cranial Nerves: No cranial nerve deficit.  Psychiatric:        Mood and Affect: Mood normal.  Behavior: Behavior normal.        Thought Content: Thought content normal.        Judgment: Judgment normal.         Assessment And Plan:     1. Boils   Improving with antibiotic  Labs did not indicate an infection  2. Cellulitis of other specified site  Improving but still having pain to abdomen  He is scheduled for an ultrasound tomorrow  He is out of work until Monday if symptoms worsen to go to ER for evaluation  [name], FNP    THE PATIENT IS ENCOURAGED TO PRACTICE SOCIAL DISTANCING DUE TO THE COVID-19 PANDEMIC.

## 2019-12-17 NOTE — Progress Notes (Addendum)
This visit occurred during the SARS-CoV-2 public health emergency.  Safety protocols were in place, including screening questions prior to the visit, additional usage of staff PPE, and extensive cleaning of exam room while observing appropriate contact time as indicated for disinfecting solutions.  Subjective:     Patient ID: Anthony Delgado , male    DOB: [birth year] , 44 y.o.   MRN: [id number]   Chief Complaint  Patient presents with  . Recurrent Skin Infections    HPI   Returns today for recheck of the boil on his abdomen boil.     No past medical history on file.   Family History  Problem Relation Age of Onset  . Cancer Mother   . Stroke Neg Hx        None at early ages  . CAD Neg Hx        None at early ages     Current Outpatient Medications:  .  albuterol (PROVENTIL HFA;VENTOLIN HFA) 108 (90 Base) MCG/ACT inhaler, Inhale 2 puffs into the lungs every 4 (four) hours as needed for wheezing or shortness of breath., Disp: 1 Inhaler, Rfl: 0 .  cephALEXin (KEFLEX) 500 MG capsule, Take 1 capsule (500 mg total) by mouth 4 (four) times daily for 10 days., Disp: 40 capsule, Rfl: 0 .  traMADol (ULTRAM) 50 MG tablet, Take 1 tablet (50 mg total) by mouth every 6 (six) hours as needed., Disp: 20 tablet, Rfl: 0 .  aspirin EC 81 MG tablet, Take 1 tablet (81 mg total) by mouth daily. (Patient not taking: Reported on 06/19/2019), Disp: , Rfl:  .  UNABLE TO FIND, Mr. Remedios was admitted to [facility] from 04/02/13 to 04/05/13.  He is medically stable to return to work. (Patient not taking: Reported on 06/19/2019), Disp: 1 Act, Rfl: 0   No Known Allergies   Review of Systems  Constitutional: Negative.   Respiratory: Negative.   Cardiovascular: Negative for chest pain, palpitations and leg swelling.  Gastrointestinal: Positive for abdominal pain. Negative for nausea and vomiting.  Skin: Positive for wound.  Neurological: Negative for dizziness and headaches.   Psychiatric/Behavioral: Negative.      Today's Vitals   12/17/19 1058  BP: 122/80  Pulse: 73  Temp: 98.7 F (37.1 C)  TempSrc: Oral  Weight: (!) 333 lb (151 kg)  Height: 5' 9.4" (1.763 m)  PainSc: 5   PainLoc: Abdomen   Body mass index is 48.61 kg/m.   Objective:  Physical Exam Constitutional:      Appearance: Normal appearance.  Cardiovascular:     Rate and Rhythm: Normal rate and regular rhythm.     Pulses: Normal pulses.  Pulmonary:     Effort: Pulmonary effort is normal. No respiratory distress.     Breath sounds: Normal breath sounds.  Abdominal:     General: Abdomen is flat. Bowel sounds are normal.     Tenderness: There is abdominal tenderness (lower abdomen pain).  Musculoskeletal:        General: No tenderness.  Skin:    Comments: Wound with open area, erythema and pain to abdomen. He has firm area just below his umbilicus  Neurological:     General: No focal deficit present.     Mental Status: He is alert and oriented to person, place, and time.     Cranial Nerves: No cranial nerve deficit.  Psychiatric:        Mood and Affect: Mood normal.        Behavior:  Behavior normal.        Thought Content: Thought content normal.        Judgment: Judgment normal.         Assessment And Plan:     1. Boils   Improving with antibiotic  Labs did not indicate infection   2. Cellulitis of other specified site  Improving but still having pain and firmness to abdomen  He is scheduled for ultrasound tomorrow  He is out of work until Monday if symptoms worsen to go to ER for further evaluation   Minette Brine, FNP    THE PATIENT IS ENCOURAGED TO PRACTICE SOCIAL DISTANCING DUE TO THE COVID-19 PANDEMIC.

## 2019-12-18 ENCOUNTER — Ambulatory Visit
Admission: RE | Admit: 2019-12-18 | Discharge: 2019-12-18 | Disposition: A | Discharge status: Home / Self Care | Payer: Commercial Managed Care - PPO | Source: Ambulatory Visit | Attending: Nurse Practitioner | Admitting: Nurse Practitioner

## 2019-12-18 DIAGNOSIS — L03818 Cellulitis of other sites: Secondary | ICD-10-CM

## 2019-12-18 DIAGNOSIS — L0292 Furuncle, unspecified: Secondary | ICD-10-CM

## 2019-12-18 NOTE — Addendum Note (Signed)
Addended by: [name] F on: 12/18/2019 04:58 PM   Modules accepted: Orders

## 2019-12-22 ENCOUNTER — Ambulatory Visit
Admission: RE | Admit: 2019-12-22 | Discharge: 2019-12-22 | Disposition: A | Discharge status: Home / Self Care | Payer: Commercial Managed Care - PPO | Source: Ambulatory Visit | Attending: Nurse Practitioner | Admitting: Nurse Practitioner

## 2019-12-22 DIAGNOSIS — L0292 Furuncle, unspecified: Secondary | ICD-10-CM

## 2019-12-22 DIAGNOSIS — L03818 Cellulitis of other sites: Secondary | ICD-10-CM

## 2019-12-29 ENCOUNTER — Other Ambulatory Visit: Payer: Self-pay | Admitting: Nurse Practitioner

## 2019-12-29 DIAGNOSIS — L03818 Cellulitis of other sites: Secondary | ICD-10-CM

## 2019-12-29 DIAGNOSIS — L0292 Furuncle, unspecified: Secondary | ICD-10-CM

## 2020-01-07 ENCOUNTER — Encounter: Payer: Self-pay | Admitting: General Surgery

## 2020-02-06 ENCOUNTER — Telehealth: Payer: Self-pay

## 2020-02-06 NOTE — Telephone Encounter (Signed)
Left pt v/m notifying her that his forms have been completed and faxed. YL,RMA

## 2020-03-17 ENCOUNTER — Ambulatory Visit: Payer: Commercial Managed Care - PPO | Admitting: Nurse Practitioner

## 2020-11-07 IMAGING — US US PELVIS LIMITED
1 series · 14 of 24 positions shown · non-contrast
Comparison: CT abdomen and pelvis 07/04/2012

CLINICAL DATA: 44-year-old with a hard pelvic lump.

EXAM:
LIMITED ULTRASOUND OF PELVIS
TECHNIQUE: Limited transabdominal ultrasound examination of the pelvis was
performed.

[Series 1: us pelvis limited · 0.07mm/px · 14 of 24 slices shown]
[im 1/24]
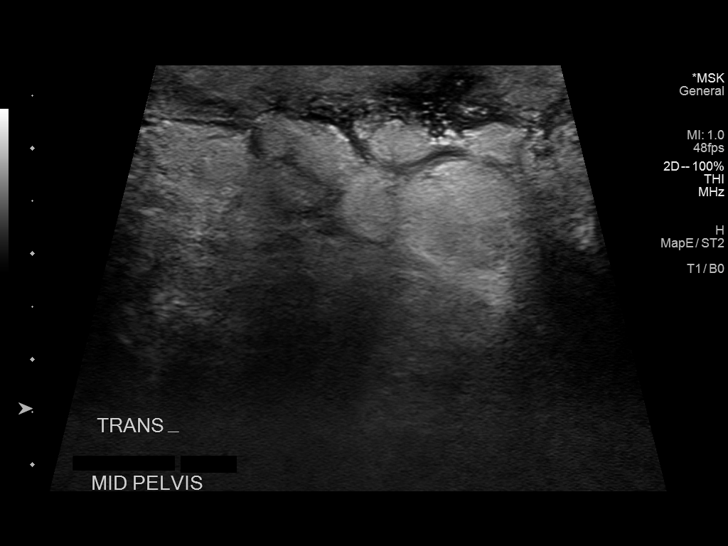
[im 3/24]
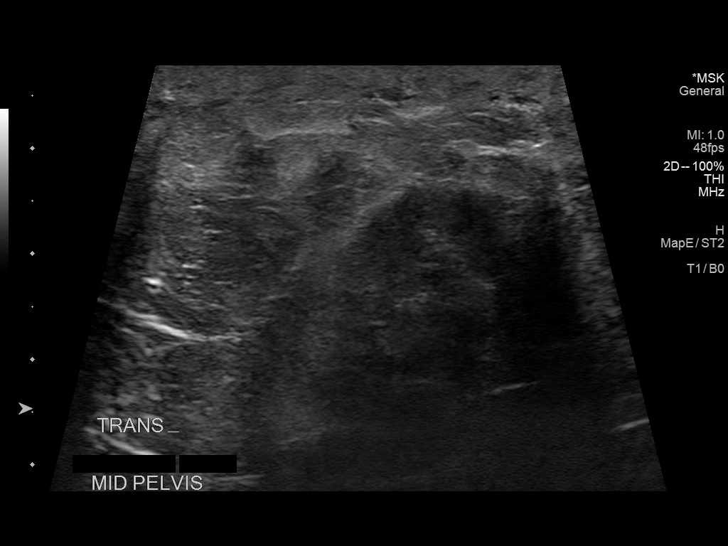
[im 5/24]
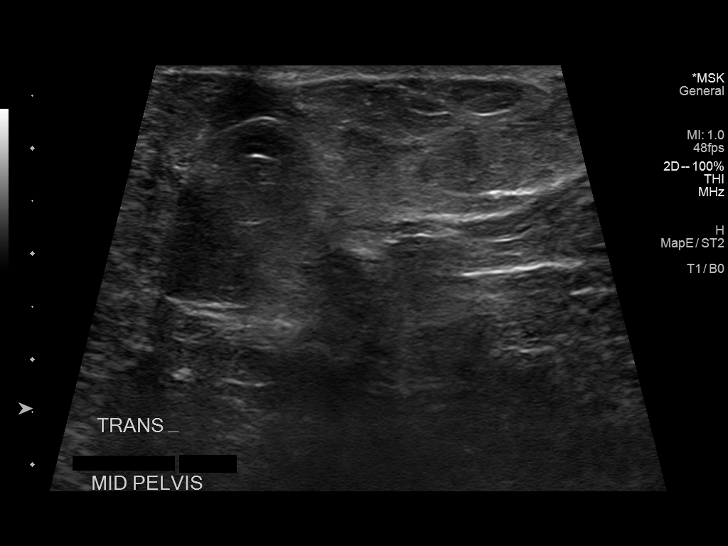
[im 7/24]
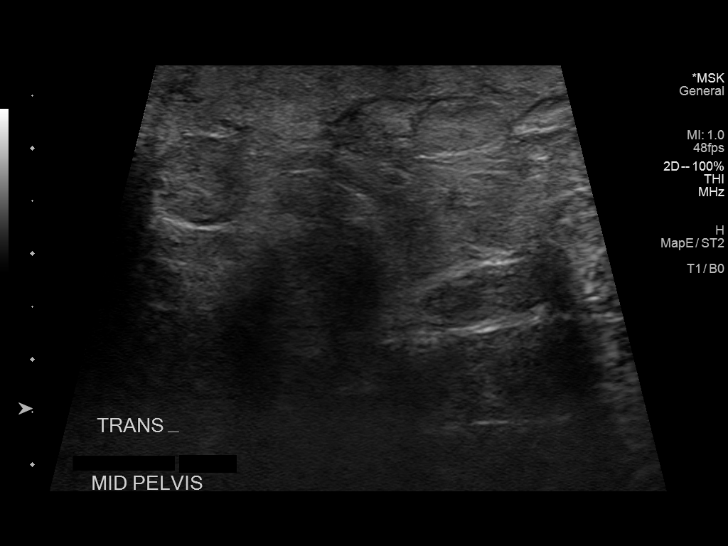
[im 8/24]
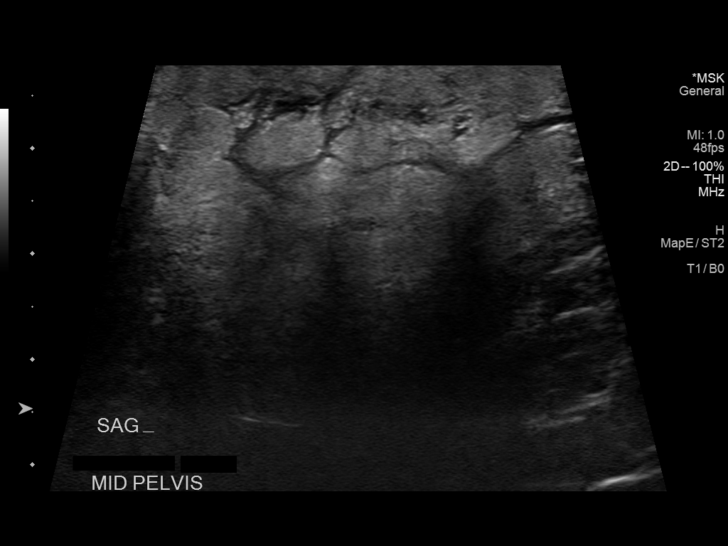
[im 10/24]
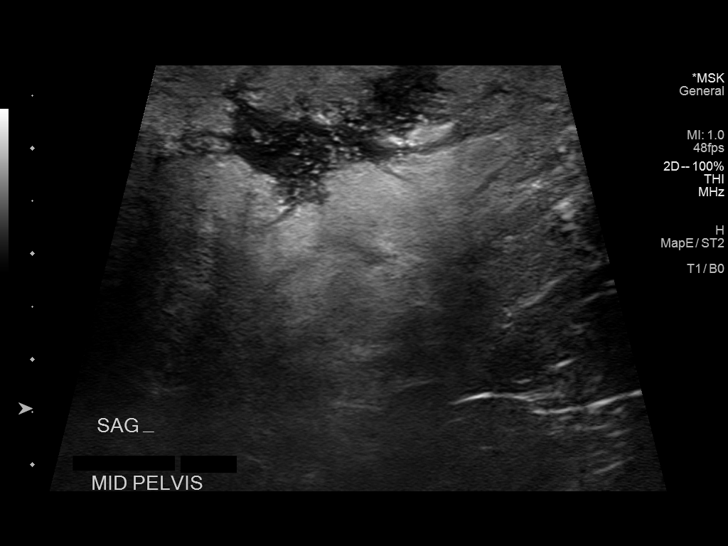
[im 12/24]
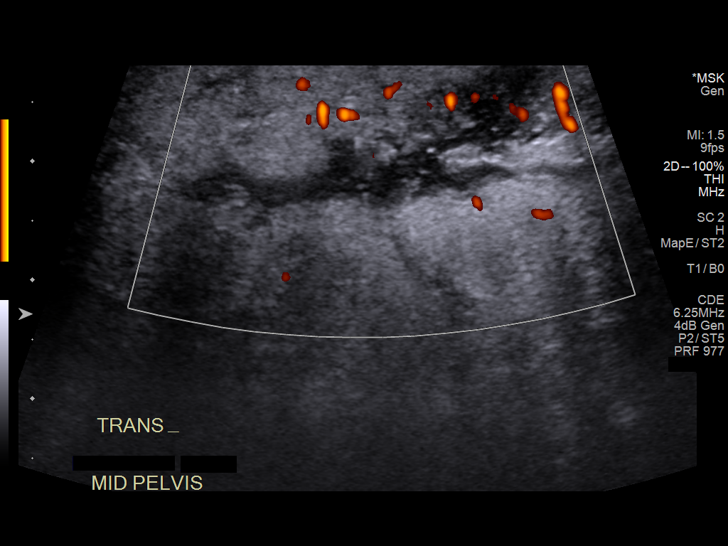
[im 13/24]
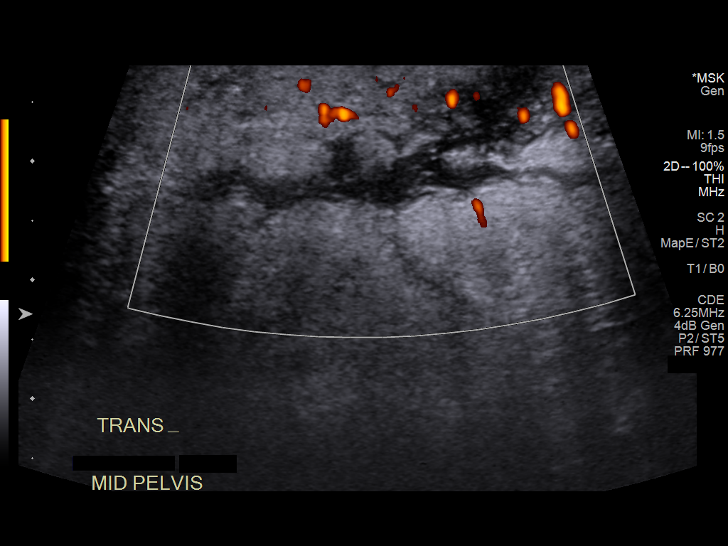
[im 15/24]
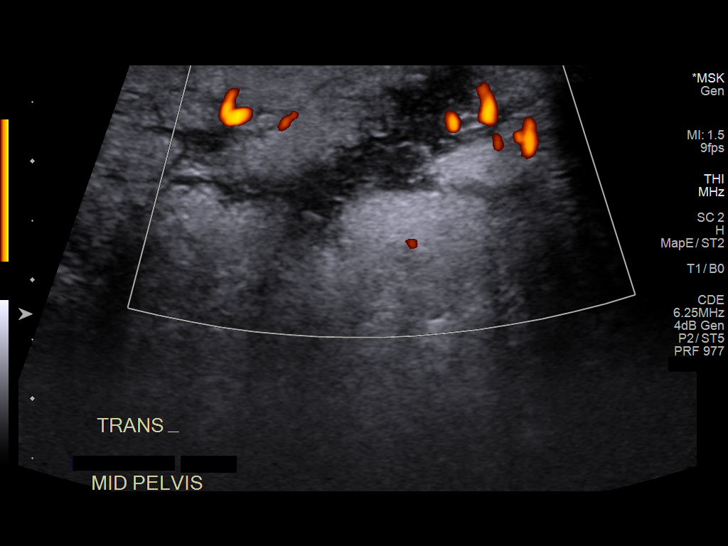
[im 17/24]
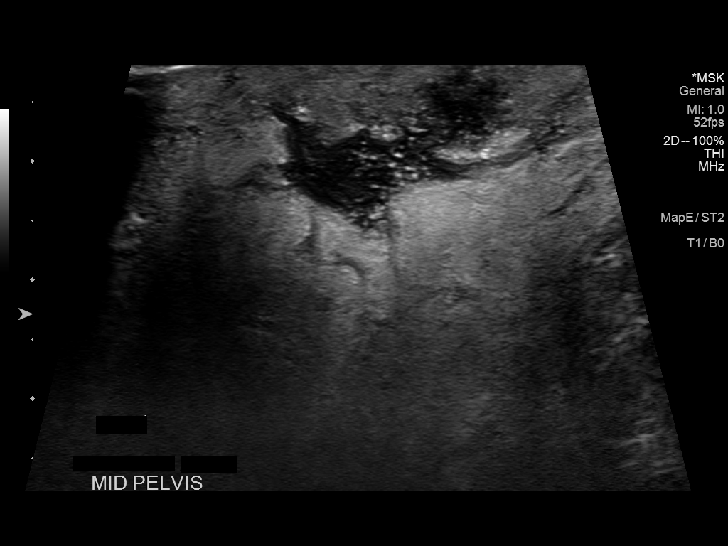
[im 19/24]
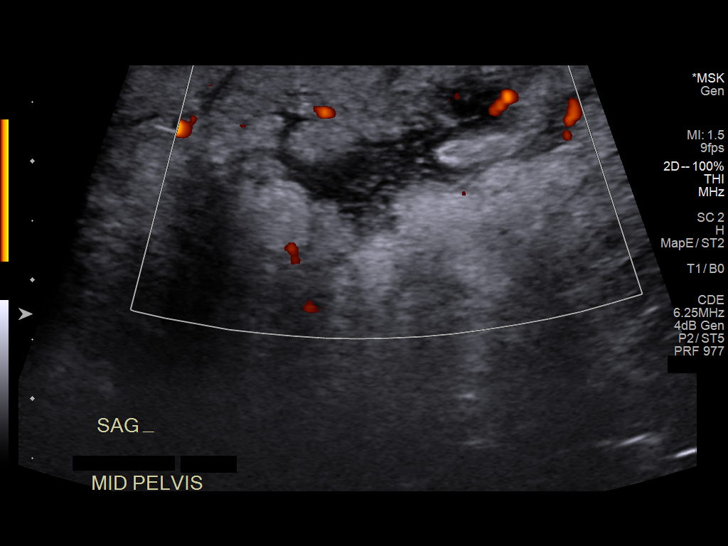
[im 20/24]
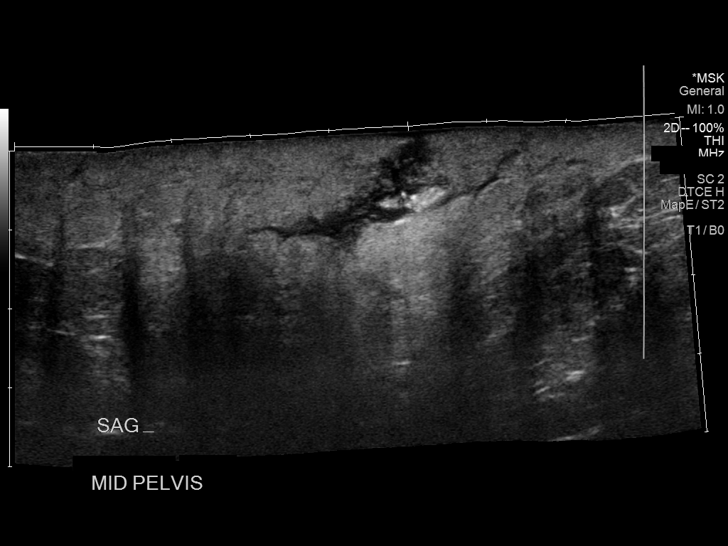
[im 22/24]
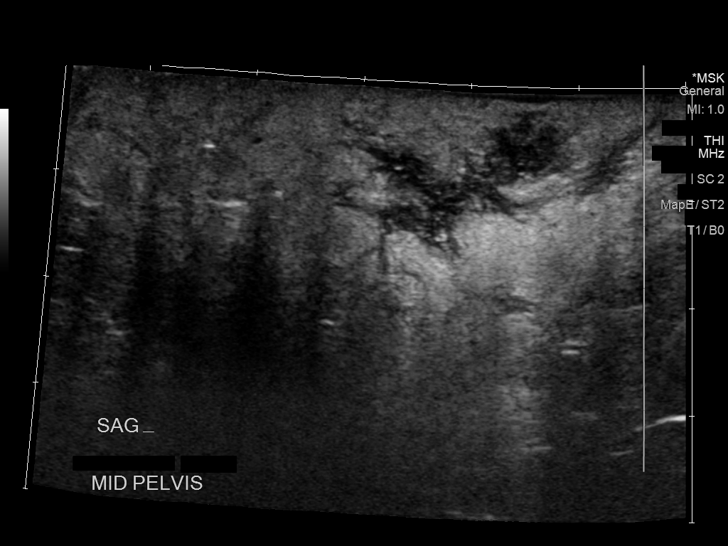
[im 24/24]
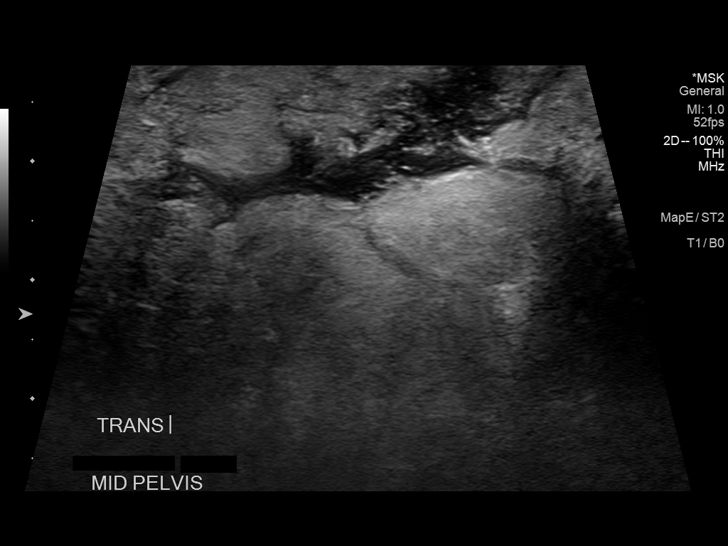

[14 of 24 positions shown; findings below may reference images not displayed]

FINDINGS: The area of concern is located at the midline and inferior to the
umbilicus. There is an irregular hypoechoic structure associated
with the palpable abnormality. This hypoechoic collection is just
below the skin surface. This structure measures 2.0 x 1.4 x 2.8 cm.
Subcutaneous edema surrounding the irregular collection.
IMPRESSION: Small irregular collection associated with the palpable abnormality
in the pelvic midline. This structure is just below the skin and
likely represents a small irregular fluid collection. Based on the
surrounding edema, findings are concerning for infectious or
inflammatory collection. This area would be amendable to
ultrasound-guided aspiration.

## 2021-09-06 ENCOUNTER — Other Ambulatory Visit: Payer: Self-pay | Admitting: Internal Medicine

## 2021-09-06 ENCOUNTER — Ambulatory Visit
Admission: RE | Admit: 2021-09-06 | Discharge: 2021-09-06 | Disposition: A | Discharge status: Home / Self Care | Payer: Commercial Managed Care - PPO | Source: Ambulatory Visit | Attending: Internal Medicine | Admitting: Internal Medicine

## 2021-09-06 DIAGNOSIS — I5089 Other heart failure: Secondary | ICD-10-CM

## 2022-05-29 ENCOUNTER — Emergency Department (HOSPITAL_COMMUNITY): Payer: Commercial Managed Care - PPO

## 2022-05-29 ENCOUNTER — Other Ambulatory Visit: Payer: Self-pay

## 2022-05-29 ENCOUNTER — Encounter (HOSPITAL_COMMUNITY): Payer: Self-pay

## 2022-05-29 ENCOUNTER — Inpatient Hospital Stay (HOSPITAL_COMMUNITY)
Admission: EM | Admit: 2022-05-29 | Discharge: 2022-06-03 | DRG: 291 | Disposition: A | Discharge status: Home / Self Care | Payer: Commercial Managed Care - PPO | Attending: Family Medicine | Admitting: Family Medicine

## 2022-05-29 DIAGNOSIS — J9601 Acute respiratory failure with hypoxia: Secondary | ICD-10-CM | POA: Diagnosis present

## 2022-05-29 DIAGNOSIS — R918 Other nonspecific abnormal finding of lung field: Secondary | ICD-10-CM

## 2022-05-29 DIAGNOSIS — I4891 Unspecified atrial fibrillation: Secondary | ICD-10-CM | POA: Diagnosis not present

## 2022-05-29 DIAGNOSIS — I429 Cardiomyopathy, unspecified: Secondary | ICD-10-CM | POA: Diagnosis not present

## 2022-05-29 DIAGNOSIS — I509 Heart failure, unspecified: Secondary | ICD-10-CM

## 2022-05-29 DIAGNOSIS — I5043 Acute on chronic combined systolic (congestive) and diastolic (congestive) heart failure: Secondary | ICD-10-CM | POA: Diagnosis present

## 2022-05-29 DIAGNOSIS — I480 Paroxysmal atrial fibrillation: Secondary | ICD-10-CM | POA: Diagnosis present

## 2022-05-29 DIAGNOSIS — Z7982 Long term (current) use of aspirin: Secondary | ICD-10-CM

## 2022-05-29 DIAGNOSIS — I5031 Acute diastolic (congestive) heart failure: Secondary | ICD-10-CM | POA: Diagnosis not present

## 2022-05-29 DIAGNOSIS — Z6841 Body Mass Index (BMI) 40.0 and over, adult: Secondary | ICD-10-CM

## 2022-05-29 DIAGNOSIS — Z20822 Contact with and (suspected) exposure to covid-19: Secondary | ICD-10-CM | POA: Diagnosis present

## 2022-05-29 DIAGNOSIS — I10 Essential (primary) hypertension: Secondary | ICD-10-CM

## 2022-05-29 DIAGNOSIS — R0601 Orthopnea: Secondary | ICD-10-CM

## 2022-05-29 DIAGNOSIS — I5021 Acute systolic (congestive) heart failure: Secondary | ICD-10-CM

## 2022-05-29 DIAGNOSIS — Z23 Encounter for immunization: Secondary | ICD-10-CM | POA: Diagnosis not present

## 2022-05-29 DIAGNOSIS — I088 Other rheumatic multiple valve diseases: Secondary | ICD-10-CM | POA: Diagnosis not present

## 2022-05-29 DIAGNOSIS — I361 Nonrheumatic tricuspid (valve) insufficiency: Secondary | ICD-10-CM | POA: Diagnosis not present

## 2022-05-29 DIAGNOSIS — I110 Hypertensive heart disease with heart failure: Principal | ICD-10-CM | POA: Diagnosis present

## 2022-05-29 DIAGNOSIS — R7989 Other specified abnormal findings of blood chemistry: Secondary | ICD-10-CM

## 2022-05-29 DIAGNOSIS — R600 Localized edema: Principal | ICD-10-CM

## 2022-05-29 DIAGNOSIS — Z79899 Other long term (current) drug therapy: Secondary | ICD-10-CM

## 2022-05-29 HISTORY — DX: Essential (primary) hypertension: I10

## 2022-05-29 LAB — CBC
HCT: 46.5 % (ref 39.0–52.0)
Hemoglobin: 16.1 g/dL (ref 13.0–17.0)
MCH: 30.6 pg (ref 26.0–34.0)
MCHC: 34.6 g/dL (ref 30.0–36.0)
MCV: 88.2 fL (ref 80.0–100.0)
Platelets: 323 10*3/uL (ref 150–400)
RBC: 5.27 MIL/uL (ref 4.22–5.81)
RDW: 13.3 % (ref 11.5–15.5)
WBC: 6.3 10*3/uL (ref 4.0–10.5)
nRBC: 0 % (ref 0.0–0.2)

## 2022-05-29 LAB — COMPREHENSIVE METABOLIC PANEL
ALT: 27 U/L (ref 0–44)
AST: 32 U/L (ref 15–41)
Albumin: 3.3 g/dL — ABNORMAL LOW (ref 3.5–5.0)
Alkaline Phosphatase: 62 U/L (ref 38–126)
Anion gap: 8 (ref 5–15)
BUN: 11 mg/dL (ref 6–20)
CO2: 24 mmol/L (ref 22–32)
Calcium: 8.8 mg/dL — ABNORMAL LOW (ref 8.9–10.3)
Chloride: 105 mmol/L (ref 98–111)
Creatinine, Ser: 1.32 mg/dL — ABNORMAL HIGH (ref 0.61–1.24)
GFR, Estimated: >60 mL/min (ref >60)
Glucose, Bld: 101 mg/dL — ABNORMAL HIGH (ref 70–99)
Potassium: 4 mmol/L (ref 3.5–5.1)
Sodium: 137 mmol/L (ref 135–145)
Total Bilirubin: 0.9 mg/dL (ref 0.3–1.2)
Total Protein: 7.3 g/dL (ref 6.5–8.1)

## 2022-05-29 LAB — CBC WITH DIFFERENTIAL/PLATELET
Abs Immature Granulocytes: 0.02 10*3/uL (ref 0.00–0.07)
Basophils Absolute: 0.1 10*3/uL (ref 0.0–0.1)
Basophils Relative: 1 %
Eosinophils Absolute: 0.2 10*3/uL (ref 0.0–0.5)
Eosinophils Relative: 3 %
HCT: 45.3 % (ref 39.0–52.0)
Hemoglobin: 15.1 g/dL (ref 13.0–17.0)
Immature Granulocytes: 0 %
Lymphocytes Relative: 40 %
Lymphs Abs: 2.4 10*3/uL (ref 0.7–4.0)
MCH: 30.6 pg (ref 26.0–34.0)
MCHC: 33.3 g/dL (ref 30.0–36.0)
MCV: 91.9 fL (ref 80.0–100.0)
Monocytes Absolute: 0.5 10*3/uL (ref 0.1–1.0)
Monocytes Relative: 8 %
Neutro Abs: 2.9 10*3/uL (ref 1.7–7.7)
Neutrophils Relative %: 48 %
Platelets: 306 10*3/uL (ref 150–400)
RBC: 4.93 MIL/uL (ref 4.22–5.81)
RDW: 13.6 % (ref 11.5–15.5)
WBC: 6.1 10*3/uL (ref 4.0–10.5)
nRBC: 0 % (ref 0.0–0.2)

## 2022-05-29 LAB — BRAIN NATRIURETIC PEPTIDE: B Natriuretic Peptide: 234.6 pg/mL — ABNORMAL HIGH (ref 0.0–100.0)

## 2022-05-29 LAB — RESP PANEL BY RT-PCR (FLU A&B, COVID) ARPGX2
Influenza A by PCR: NEGATIVE
Influenza B by PCR: NEGATIVE
SARS Coronavirus 2 by RT PCR: NEGATIVE

## 2022-05-29 LAB — TSH: TSH: 3.826 u[IU]/mL (ref 0.350–4.500)

## 2022-05-29 LAB — TROPONIN I (HIGH SENSITIVITY)
Troponin I (High Sensitivity): 169 ng/L (ref <18)
Troponin I (High Sensitivity): 183 ng/L (ref <18)
Troponin I (High Sensitivity): 155 ng/L (ref <18)

## 2022-05-29 MED ORDER — INFLUENZA VAC SPLIT QUAD 0.5 ML IM SUSY
0.5000 mL | PREFILLED_SYRINGE | INTRAMUSCULAR | Status: AC
  Administered 2022-05-30: 0.5 mL via INTRAMUSCULAR
  Filled 2022-05-29: qty 0.5

## 2022-05-29 MED ORDER — POTASSIUM CHLORIDE CRYS ER 20 MEQ PO TBCR
20.0000 meq | EXTENDED_RELEASE_TABLET | Freq: Two times a day (BID) | ORAL | Status: DC
  Administered 2022-05-29: 20 meq via ORAL
  Filled 2022-05-29 (×2): qty 1

## 2022-05-29 MED ORDER — ENOXAPARIN SODIUM 80 MG/0.8ML IJ SOSY
80.0000 mg | PREFILLED_SYRINGE | INTRAMUSCULAR | Status: DC
  Administered 2022-05-29: 80 mg via SUBCUTANEOUS
  Filled 2022-05-29: qty 0.8

## 2022-05-29 MED ORDER — POLYETHYLENE GLYCOL 3350 17 G PO PACK: 17.0000 g | PACK | Freq: Every day | ORAL | Status: DC | PRN

## 2022-05-29 MED ORDER — METOPROLOL TARTRATE 12.5 MG HALF TABLET
12.5000 mg | ORAL_TABLET | Freq: Two times a day (BID) | ORAL | Status: DC
  Administered 2022-05-29 – 2022-05-30 (×2): 12.5 mg via ORAL
  Filled 2022-05-29 (×2): qty 1

## 2022-05-29 MED ORDER — DILTIAZEM LOAD VIA INFUSION
20.0000 mg | Freq: Once | INTRAVENOUS | Status: AC
  Administered 2022-05-29: 20 mg via INTRAVENOUS
  Filled 2022-05-29: qty 20

## 2022-05-29 MED ORDER — ASPIRIN 81 MG PO CHEW
324.0000 mg | CHEWABLE_TABLET | Freq: Once | ORAL | Status: AC
  Administered 2022-05-29: 324 mg via ORAL
  Filled 2022-05-29: qty 4

## 2022-05-29 MED ORDER — ORAL CARE MOUTH RINSE: 15.0000 mL | OROMUCOSAL | Status: DC | PRN

## 2022-05-29 MED ORDER — FUROSEMIDE 10 MG/ML IJ SOLN
40.0000 mg | Freq: Once | INTRAMUSCULAR | Status: AC
  Administered 2022-05-29: 40 mg via INTRAVENOUS
  Filled 2022-05-29: qty 4

## 2022-05-29 MED ORDER — DILTIAZEM HCL-DEXTROSE 125-5 MG/125ML-% IV SOLN (PREMIX)
5.0000 mg/h | INTRAVENOUS | Status: DC
  Administered 2022-05-29: 5 mg/h via INTRAVENOUS
  Filled 2022-05-29: qty 125

## 2022-05-29 MED ORDER — FUROSEMIDE 10 MG/ML IJ SOLN
40.0000 mg | Freq: Two times a day (BID) | INTRAMUSCULAR | Status: AC
  Administered 2022-05-29 – 2022-05-30 (×2): 40 mg via INTRAVENOUS
  Filled 2022-05-29 (×2): qty 4

## 2022-05-29 MED ORDER — ACETAMINOPHEN 325 MG PO TABS
650.0000 mg | ORAL_TABLET | Freq: Four times a day (QID) | ORAL | Status: DC | PRN
  Administered 2022-06-01: 650 mg via ORAL
  Filled 2022-05-29: qty 2

## 2022-05-29 MED ORDER — APIXABAN 5 MG PO TABS
5.0000 mg | ORAL_TABLET | Freq: Two times a day (BID) | ORAL | Status: DC
  Administered 2022-05-30 – 2022-06-03 (×9): 5 mg via ORAL
  Filled 2022-05-29 (×10): qty 1

## 2022-05-29 NOTE — ED Notes (Signed)
Troponin 169 notified Charge, Therapist, sports.

## 2022-05-29 NOTE — Progress Notes (Signed)
ANTICOAGULATION CONSULT NOTE - Initial Consult  Pharmacy Consult for Apixaban Indication: atrial fibrillation  No Known Allergies  Patient Measurements: Height: 5\' 9"  (175.3 cm) Weight: (!) 157.4 kg (347 lb) IBW/kg (Calculated) : 70.7  Vital Signs: Temp: 98.3 F (36.8 C) (10/02 1903) Temp Source: Oral (10/02 1426) BP: 144/111 (10/02 2051) Pulse Rate: 92 (10/02 2051)  Labs: Recent Labs    05/29/22 1450 05/29/22 1845  HGB 15.1  --   HCT 45.3  --   PLT 306  --   CREATININE 1.32*  --   TROPONINIHS 169* 183*    Estimated Creatinine Clearance: 102 mL/min (A) (by C-G formula based on SCr of 1.32 mg/dL (H)).   Medical History: Past Medical History:  Diagnosis Date   Hypertension     Medications:  (Not in a hospital admission)  Scheduled:   enoxaparin (LOVENOX) injection  80 mg Subcutaneous Q24H   furosemide  40 mg Intravenous BID   metoprolol tartrate  12.5 mg Oral BID   potassium chloride  20 mEq Oral BID    Assessment: 46 yo M presenting with shortness of breat, worsening edema and chest tightness. On admission patient was found to have new onset atrial fibrillation with RVR and is indicated for anticoagulation with CHA2DS2-VASc 2. CBC wnl on admission, no s/s of bleeding noted  Goal of Therapy:  Therapeutic anticoagulation for prevention of Vte in the setting of afib Monitor platelets by anticoagulation protocol: Yes   Plan:  Initiate Apixaban 5mg  PO  BID  Monitor H&H and renal function  Titus Dubin, PharmD PGY1 Pharmacy Resident 05/29/2022 9:30 PM

## 2022-05-29 NOTE — ED Notes (Signed)
This RN attempted IV + blood draw, unsuccessful. IV team order to be placed

## 2022-05-29 NOTE — ED Triage Notes (Signed)
Patient has hx of HTN and has not taken his meds in a while but over a week ago started having leg swelling and tightness in his chest along with sob.  Patient does not have a hx of afib per patient but is in afib at this current time.

## 2022-05-29 NOTE — ED Provider Triage Note (Signed)
Emergency Medicine Provider Triage Evaluation Note  Anthony Delgado , a 46 y.o. male  was evaluated in triage.  Pt complains of shortness of breath, chest tightness and lower extremity swelling.  Patient states he has been out of his at home blood pressure medicine for the past 2 weeks.  He tried establish care with a primary care given that his primary care closed on his office without notice.  He had an appointment earlier today with a new PCP who told him to come to the emergency department for further evaluation.  Patient denies history of CHF, A-fib.  Reports persistent cough that is gradually gotten worse.  .  Review of Systems  Positive: See above Negative:   Physical Exam  BP (!) 184/138   Pulse (!) 122   Temp 98.4 F (36.9 C) (Oral)   Resp 20   Ht 5\' 9"  (1.753 m)   Wt (!) 157.4 kg   SpO2 96%   BMI 51.24 kg/m  Gen:   Awake, no distress   Resp:  Normal effort  MSK:   Moves extremities without difficulty  Other:  3+ bilateral lower extremity pitting edema.  Diffuse wheeze/Rales with decreased lung sounds auscultated on pulmonary exam.  Medical Decision Making  Medically screening exam initiated at 2:48 PM.  Appropriate orders placed.  Anthony Delgado was informed that the remainder of the evaluation will be completed by another provider, this initial triage assessment does not replace that evaluation, and the importance of remaining in the ED until their evaluation is complete.     Wilnette Kales, [place] 05/29/22 1455

## 2022-05-29 NOTE — ED Provider Notes (Addendum)
[facility] EMERGENCY DEPARTMENT Provider Note   CSN: [id number] Arrival date & time: 05/29/22  1420     History  Chief Complaint  Patient presents with   Leg Swelling    Anthony Delgado is a 46 y.o. male.  Patient w hx htn, c/o bilateral leg edema and sob, w new onset orthopnea in the past week. Symptoms acute onset, moderate, persistent, slowly worsening. States chest has felt tight during this time, but denies discrete episodes of chest pain, exertional chest pain or pleuritic chest pain. No hx cad. No fam hx premature cad. Occasional non prod cough. No fever or chills. No unilateral leg pain or swelling. Hx thn, out of meds in past couple weeks.  No hx afib. No sense of palpitations.   The history is provided by the patient and medical records.       Home Medications Prior to Admission medications   Medication Sig Start Date End Date Taking? Authorizing Provider  albuterol (PROVENTIL HFA;VENTOLIN HFA) 108 (90 Base) MCG/ACT inhaler Inhale 2 puffs into the lungs every 4 (four) hours as needed for wheezing or shortness of breath. 12/23/15   Hayden Rasmussen, NP  aspirin EC 81 MG tablet Take 1 tablet (81 mg total) by mouth daily. Patient not taking: Reported on 06/19/2019 04/05/13   TatOnalee Hua, MD  traMADol (ULTRAM) 50 MG tablet Take 1 tablet (50 mg total) by mouth every 6 (six) hours as needed. 06/19/19   [name], FNP  UNABLE TO FIND Mr. Speciale was admitted to [facility] from 04/02/13 to 04/05/13.  He is medically stable to return to work. Patient not taking: Reported on 06/19/2019 04/05/13   Catarina Hartshorn, MD      Allergies    Patient has no known allergies.    Review of Systems   Review of Systems  Constitutional:  Negative for chills and fever.  HENT:  Negative for sore throat.   Eyes:  Negative for redness.  Respiratory:  Positive for cough and shortness of breath.   Cardiovascular:  Positive for chest pain and leg swelling. Negative for  palpitations.  Gastrointestinal:  Negative for abdominal pain, blood in stool, diarrhea and vomiting.  Genitourinary:  Negative for dysuria and flank pain.  Musculoskeletal:  Negative for back pain and neck pain.  Skin:  Negative for rash.  Neurological:  Negative for headaches.  Hematological:  Does not bruise/bleed easily.  Psychiatric/Behavioral:  Negative for confusion.     Physical Exam Updated Vital Signs BP (!) 146/122   Pulse (!) 109   Temp 98.4 F (36.9 C) (Oral)   Resp (!) 23   Ht 1.753 m (5\' 9" )   Wt (!) 157.4 kg   SpO2 99%   BMI 51.24 kg/m  Physical Exam Vitals and nursing note reviewed.  Constitutional:      Appearance: Normal appearance. He is well-developed.  HENT:     Head: Atraumatic.     Nose: Nose normal.     Mouth/Throat:     Mouth: Mucous membranes are moist.     Pharynx: Oropharynx is clear.  Eyes:     General: No scleral icterus.    Conjunctiva/sclera: Conjunctivae normal.  Neck:     Trachea: No tracheal deviation.  Cardiovascular:     Rate and Rhythm: Tachycardia present. Rhythm irregular.     Pulses: Normal pulses.     Heart sounds: Normal heart sounds. No murmur heard.    No friction rub. No gallop.  Pulmonary:  Effort: Pulmonary effort is normal. No accessory muscle usage or respiratory distress.     Breath sounds: Rales present.     Comments: Rales base Abdominal:     General: Bowel sounds are normal. There is no distension.     Palpations: Abdomen is soft.     Tenderness: There is no abdominal tenderness. There is no guarding.  Genitourinary:    Comments: No cva tenderness. Musculoskeletal:        General: No swelling.     Cervical back: Normal range of motion and neck supple. No rigidity.     Right lower leg: Edema present.     Left lower leg: Edema present.     Comments: Symmetric bilateral leg edema, moderate. No calf pain or tenderness.   Skin:    General: Skin is warm and dry.     Findings: No rash.  Neurological:      Mental Status: He is alert.     Comments: Alert, speech clear.   Psychiatric:        Mood and Affect: Mood normal.     ED Results / Procedures / Treatments   Labs (all labs ordered are listed, but only abnormal results are displayed) Results for orders placed or performed during the hospital encounter of 05/29/22  Comprehensive metabolic panel  Result Value Ref Range   Sodium 137 135 - 145 mmol/L   Potassium 4.0 3.5 - 5.1 mmol/L   Chloride 105 98 - 111 mmol/L   CO2 24 22 - 32 mmol/L   Glucose, Bld 101 (H) 70 - 99 mg/dL   BUN 11 6 - 20 mg/dL   Creatinine, Ser 1.32 (H) 0.61 - 1.24 mg/dL   Calcium 8.8 (L) 8.9 - 10.3 mg/dL   Total Protein 7.3 6.5 - 8.1 g/dL   Albumin 3.3 (L) 3.5 - 5.0 g/dL   AST 32 15 - 41 U/L   ALT 27 0 - 44 U/L   Alkaline Phosphatase 62 38 - 126 U/L   Total Bilirubin 0.9 0.3 - 1.2 mg/dL   GFR, Estimated >60 >60 mL/min   Anion gap 8 5 - 15  CBC with Differential  Result Value Ref Range   WBC 6.1 4.0 - 10.5 K/uL   RBC 4.93 4.22 - 5.81 MIL/uL   Hemoglobin 15.1 13.0 - 17.0 g/dL   HCT 45.3 39.0 - 52.0 %   MCV 91.9 80.0 - 100.0 fL   MCH 30.6 26.0 - 34.0 pg   MCHC 33.3 30.0 - 36.0 g/dL   RDW 13.6 11.5 - 15.5 %   Platelets 306 150 - 400 K/uL   nRBC 0.0 0.0 - 0.2 %   Neutrophils Relative % 48 %   Neutro Abs 2.9 1.7 - 7.7 K/uL   Lymphocytes Relative 40 %   Lymphs Abs 2.4 0.7 - 4.0 K/uL   Monocytes Relative 8 %   Monocytes Absolute 0.5 0.1 - 1.0 K/uL   Eosinophils Relative 3 %   Eosinophils Absolute 0.2 0.0 - 0.5 K/uL   Basophils Relative 1 %   Basophils Absolute 0.1 0.0 - 0.1 K/uL   Immature Granulocytes 0 %   Abs Immature Granulocytes 0.02 0.00 - 0.07 K/uL  Brain natriuretic peptide  Result Value Ref Range   B Natriuretic Peptide 234.6 (H) 0.0 - 100.0 pg/mL  Troponin I (High Sensitivity)  Result Value Ref Range   Troponin I (High Sensitivity) 169 (HH) <18 ng/L   DG Chest 2 View  Result Date: 05/29/2022 CLINICAL DATA:  Shortness of breath. EXAM:  CHEST - 2 VIEW COMPARISON:  September 06, 2021. FINDINGS: Stable cardiomegaly. Minimal left basilar subsegmental atelectasis or scarring is noted. Right lung is clear. The visualized skeletal structures are unremarkable. IMPRESSION: Minimal left basilar subsegmental atelectasis or scarring. Electronically Signed   By: Marijo Conception M.D.   On: 05/29/2022 15:22    EKG EKG Interpretation  Date/Time:  Monday May 29 2022 14:23:33 EDT Ventricular Rate:  127 PR Interval:    QRS Duration: 68 QT Interval:  292 QTC Calculation: 424 R Axis:   54 Text Interpretation: Atrial fibrillation with rapid ventricular response Non-specific ST-t changes Confirmed by Lajean Saver [phone]) on 05/29/2022 4:56:11 PM  Radiology DG Chest 2 View  Result Date: 05/29/2022 CLINICAL DATA:  Shortness of breath. EXAM: CHEST - 2 VIEW COMPARISON:  September 06, 2021. FINDINGS: Stable cardiomegaly. Minimal left basilar subsegmental atelectasis or scarring is noted. Right lung is clear. The visualized skeletal structures are unremarkable. IMPRESSION: Minimal left basilar subsegmental atelectasis or scarring. Electronically Signed   By: Marijo Conception M.D.   On: 05/29/2022 15:22    Procedures Procedures    Medications Ordered in ED Medications  furosemide (LASIX) injection 40 mg (has no administration in time range)    ED Course/ Medical Decision Making/ A&P                           Medical Decision Making Problems Addressed: Acute systolic congestive heart failure ([place]): acute illness or injury with systemic symptoms that poses a threat to life or bodily functions Atrial fibrillation with RVR ([place]): acute illness or injury with systemic symptoms that poses a threat to life or bodily functions Bilateral leg edema: acute illness or injury with systemic symptoms Elevated troponin: acute illness or injury Essential hypertension: chronic illness or injury with exacerbation, progression, or side effects of treatment that  poses a threat to life or bodily functions New onset atrial fibrillation [facility]): acute illness or injury with systemic symptoms that poses a threat to life or bodily functions Orthopnea: acute illness or injury with systemic symptoms that poses a threat to life or bodily functions  Amount and/or Complexity of Data Reviewed External Data Reviewed: notes. Labs: ordered. Decision-making details documented in ED Course. Radiology: ordered and independent interpretation performed. Decision-making details documented in ED Course. ECG/medicine tests: ordered and independent interpretation performed. Decision-making details documented in ED Course. Discussion of management or test interpretation with external provider(s): Medicine   Risk OTC drugs. Prescription drug management. Decision regarding hospitalization.   Iv ns. Continuous pulse ox and cardiac monitoring. Labs ordered/sent. Imaging ordered.   Reviewed nursing notes and prior charts for additional history. External reports reviewed.   Cardiac monitor: afib rate 120  Labs reviewed/interpreted by me - wbc and hgb normal. Trop mildly high. Bnp elev.   Xrays reviewed/interpreted by me - ?vascular congestion  Lasix iv.  Cardizem bolus and gtt.   Medicine consulted for admission. Discussed pt with hospitalist - will admit, requests cardiology consult as well - cardiology consulted.   Recheck no cp. Afib, rate less.   CRITICAL CARE RE : new onset afib w rvr with acute chf and uncontrolled hypertension Performed by: Mirna Mires Total critical care time: 40  minutes Critical care time was exclusive of separately billable procedures and treating other patients. Critical care was necessary to treat or prevent imminent or life-threatening deterioration. Critical care was time spent personally by me on the  following activities: development of treatment plan with patient and/or surrogate as well as nursing, discussions with consultants,  evaluation of patient's response to treatment, examination of patient, obtaining history from patient or surrogate, ordering and performing treatments and interventions, ordering and review of laboratory studies, ordering and review of radiographic studies, pulse oximetry and re-evaluation of patient's condition.          Final Clinical Impression(s) / ED Diagnoses Final diagnoses:  None    Rx / DC Orders ED Discharge Orders     None         Lajean Saver, MD 05/29/22 2005

## 2022-05-29 NOTE — H&P (Signed)
History and Physical  Anthony Delgado ZJQ:[id number] DOB: July 31, 1976 DOA: 05/29/2022  Referring physician: Dr. Ashok Cordia, [place]  PCP: Minette Brine, [place]  Outpatient Specialists: None Patient coming from: Home  Chief Complaint: Shortness of breath.  HPI: Anthony Delgado is a 46 y.o. male with medical history significant for severe morbid obesity, essential hypertension, medication noncompliance, who presented to [facility] ED with complaints of gradually worsening bilateral lower extremity edema of 1 week duration, new onset orthopnea, dyspnea with minimal exertion and chest tightness.  Recently ran out of his home medications.  Denies subjective fevers.    Upon presentation to the ED, the patient was severely hypertensive (BP 184/138) with new onset atrial fibrillation with rates in the 120-150s.  Denies palpitations.  He received a bolus of IV Cardizem and Cardizem drip was initiated in the ED with improvement.  Lab studies were remarkable for elevated high-sensitivity troponin 183.  Chest x-ray showing mild vascular congestion.  EDP Dr. Ashok Cordia will consult cardiology.  The patient was admitted by [facility], hospitalist service.  ED Course: Tmax 98.4.  BP 123/105.  Pulse 125, respiratory 25, saturation 99% on room air.  Lab studies remarkable for creatinine 1.32.  Baseline creatinine 1.1.  CBC essentially unremarkable.  Review of Systems: Review of systems as noted in the HPI. All other systems reviewed and are negative.   Past Medical History:  Diagnosis Date   Hypertension    Past Surgical History:  Procedure Laterality Date   CHOLECYSTECTOMY  2009    Social History:  reports that he has never smoked. He has never used smokeless tobacco. He reports that he does not drink alcohol and does not use drugs.   No Known Allergies  Family History  Problem Relation Age of Onset   Cancer Mother    Stroke Neg Hx        None at early ages   CAD Neg Hx        None at early ages      Prior to Admission  medications   Medication Sig Start Date End Date Taking? Authorizing Provider  albuterol (PROVENTIL HFA;VENTOLIN HFA) 108 (90 Base) MCG/ACT inhaler Inhale 2 puffs into the lungs every 4 (four) hours as needed for wheezing or shortness of breath. 12/23/15  Yes Mabe, Shanon Brow, NP  amLODipine (NORVASC) 10 MG tablet Take 10 mg by mouth daily.   Yes [provider]  aspirin EC 81 MG tablet Take 1 tablet (81 mg total) by mouth daily. 04/05/13  Yes Tat, Shanon Brow, MD  furosemide (LASIX) 40 MG tablet Take 40 mg by mouth 2 (two) times daily.   Yes [provider]  traMADol (ULTRAM) 50 MG tablet Take 1 tablet (50 mg total) by mouth every 6 (six) hours as needed. Patient not taking: Reported on 05/29/2022 06/19/19   Minette Brine, FNP  UNABLE TO FIND [name] was admitted to [facility] from 04/02/13 to 04/05/13.  He is medically stable to return to work. Patient not taking: Reported on 06/19/2019 04/05/13   [name], MD    Physical Exam: BP (!) 123/105   Pulse 64   Temp 98.3 F (36.8 C)   Resp (!) 25   Ht 5\' 9"  (1.753 m)   Wt (!) 157.4 kg   SpO2 99%   BMI 51.24 kg/m   General: 46 y.o. year-old male well developed well nourished in no acute distress.  Alert and oriented x3. Cardiovascular: Regular rate and rhythm with no rubs or gallops.  No thyromegaly or  JVD noted.  3+ pitting edema in lower extremities bilaterally.   Respiratory: Mild rales at bases with no wheezing noted. Good inspiratory effort. Abdomen: Soft nontender nondistended with normal bowel sounds x4 quadrants. Muskuloskeletal: No cyanosis or clubbing.  3+ pitting edema noted bilaterally in lower extremities. Neuro: CN II-XII intact, strength, sensation, reflexes Skin: No ulcerative lesions noted or rashes Psychiatry: Judgement and insight appear normal. Mood is appropriate for condition and setting          Labs on Admission:  Basic Metabolic Panel: Recent Labs  Lab 05/29/22 1450  NA 137  K 4.0  CL 105   CO2 24  GLUCOSE 101*  BUN 11  CREATININE 1.32*  CALCIUM 8.8*   Liver Function Tests: Recent Labs  Lab 05/29/22 1450  AST 32  ALT 27  ALKPHOS 62  BILITOT 0.9  PROT 7.3  ALBUMIN 3.3*   No results for input(s): "LIPASE", "AMYLASE" in the last 168 hours. No results for input(s): "AMMONIA" in the last 168 hours. CBC: Recent Labs  Lab 05/29/22 1450  WBC 6.1  NEUTROABS 2.9  HGB 15.1  HCT 45.3  MCV 91.9  PLT 306   Cardiac Enzymes: No results for input(s): "CKTOTAL", "CKMB", "CKMBINDEX", "TROPONINI" in the last 168 hours.  BNP (last 3 results) Recent Labs    05/29/22 1450  BNP 234.6*    ProBNP (last 3 results) No results for input(s): "PROBNP" in the last 8760 hours.  CBG: No results for input(s): "GLUCAP" in the last 168 hours.  Radiological Exams on Admission: DG Chest 2 View  Result Date: 05/29/2022 CLINICAL DATA:  Shortness of breath. EXAM: CHEST - 2 VIEW COMPARISON:  September 06, 2021. FINDINGS: Stable cardiomegaly. Minimal left basilar subsegmental atelectasis or scarring is noted. Right lung is clear. The visualized skeletal structures are unremarkable. IMPRESSION: Minimal left basilar subsegmental atelectasis or scarring. Electronically Signed   By: Marijo Conception M.D.   On: 05/29/2022 15:22    EKG: I independently viewed the EKG done and my findings are as followed: Atrial fibrillation rate of 127.  Nonspecific ST-T changes.  QTc 424.  Assessment/Plan Present on Admission:  New onset atrial fibrillation ([place])  Principal Problem:   New onset atrial fibrillation ([place])  New onset atrial fibrillation with RVR Presenting rate 120s to 150s-no palpitations CHA2DS2-VASc of 2 Eliquis started Received a bolus of IV Cardizem Currently rate controlled on Cardizem drip. P.o. Lopressor 12.5 mg twice daily added. Wean off Cardizem drip as tolerated Add TSH Per Dr. Ashok Cordia, he will consult cardiology  Elevated troponin, suspect demand ischemia in the setting of  A-fib with RVR High-sensitivity troponin 183 No evidence of acute ischemia on 12-lead EKG Denies any anginal symptoms at the time of this visit. Follow 2D echo Cycle troponin x2  Acute CHF, unspecified No prior history of CHF No prior echocardiogram on file Follow 2D echo Monitor strict I's and O's and daily weight  Essential hypertension, uncontrolled BP is not at goal, elevated Currently on Cardizem drip and Lopressor p.o. 12.5 mg twice daily Monitor vital signs  Severe morbid obesity BMI 51 Recommended weigh loss outpatient with regular physical activity and healthy dieting.   Critical care time: 65 minutes    DVT prophylaxis: Eliquis  Code Status: Full code   Family Communication: None at bedside   Disposition Plan: Admitted to [facility]   Consults called: Cardiology   Admission status: Inpatient    Status is: Inpatient The patient requires at least 2 midnights for further evaluation  and treatment of present condition.   Kayleen Memos MD [facility] Pager [phone]  If 7PM-7AM, please contact night-coverage [email or web address] Password [email or web address]  05/29/2022, 8:11 PM

## 2022-05-29 NOTE — Consult Note (Signed)
Cardiology Consultation   Patient ID: Anthony Delgado MRN: [id number]; DOB: [birth year]  Admit date: 05/29/2022 Date of Consult: 05/29/2022  PCP:  Minette Brine, [place] Providers Cardiologist:  None   {  Patient Profile:   Anthony Delgado is a 46 y.o. male with a hx of  severe morbid obesity, essential hypertension, medication noncompliancewho is being seen 05/29/2022 for the evaluation of Afib and SOB at the request of Dr. Nevada Crane  History of Present Illness:   Anthony Delgado  is a 46 y.o. male with a hx of  severe morbid obesity, essential hypertension, medication noncompliancewho is being seen 05/29/2022 for the evaluation of Afib and SOB at the request of Dr. Nevada Crane. He came to the ED because he has been having progressive worsening of SOB and lower leg edema. Recently ran out of his home medications. Also mild chest tightness, also has PND and orthopnea. In the ED, he was Afib RVR in 150s; CXR showed mild congestion and troponin in 180s with minimal delta. In the ED, he was started on diltiazem drip. Currently feels much better. Cr 1.32. Cardiology consulted. Echo in 2014 normal EF; stress test also normal at that time.    Past Medical History:  Diagnosis Date   Hypertension     Past Surgical History:  Procedure Laterality Date   CHOLECYSTECTOMY  2009      Inpatient Medications: Scheduled Meds:  [START ON 05/30/2022] apixaban  5 mg Oral BID   furosemide  40 mg Intravenous BID   [START ON 05/30/2022] influenza vac split quadrivalent PF  0.5 mL Intramuscular Tomorrow-1000   metoprolol tartrate  12.5 mg Oral BID   potassium chloride  20 mEq Oral BID   Continuous Infusions:  diltiazem (CARDIZEM) infusion 5 mg/hr (05/29/22 2300)   PRN Meds: acetaminophen, polyethylene glycol  Allergies:   No Known Allergies  Social History:   Social History   Socioeconomic History   Marital status: Married    Spouse name: Not on file   Number of children: Not on file    Years of education: Not on file   Highest education level: Not on file  Occupational History   Not on file  Tobacco Use   Smoking status: Never   Smokeless tobacco: Never  Substance and Sexual Activity   Alcohol use: No   Drug use: No   Sexual activity: Yes    Partners: Female  Other Topics Concern   Not on file  Social History Narrative   Not on file   Social Determinants of Health   Financial Resource Strain: Not on file  Food Insecurity: No Food Insecurity (05/29/2022)   Hunger Vital Sign    Worried About Running Out of Food in the Last Year: Never true    Ran Out of Food in the Last Year: Never true  Transportation Needs: No Transportation Needs (05/29/2022)   PRAPARE - Hydrologist (Medical): No    Lack of Transportation (Non-Medical): No  Physical Activity: Not on file  Stress: Not on file  Social Connections: Not on file  Intimate Partner Violence: Not At Risk (05/29/2022)   Humiliation, Afraid, Rape, and Kick questionnaire    Fear of Current or Ex-Partner: No    Emotionally Abused: No    Physically Abused: No    Sexually Abused: No    Family History:   Family History  Problem Relation Age of Onset   Cancer Mother    Stroke Neg  Hx        None at early ages   CAD Neg Hx        None at early ages     ROS:  Please see the history of present illness.  All other ROS reviewed and negative.     Physical Exam/Data:   Vitals:   05/29/22 2130 05/29/22 2139 05/29/22 2230 05/29/22 2305  BP: (!) 130/101 (!) 130/101 126/81 (!) 177/132  Pulse: 86 99 88   Resp: 18  (!) 24 20  Temp:    97.8 F (36.6 C)  TempSrc:    Oral  SpO2: 97%  96% 97%  Weight:    (!) 157.5 kg  Height:    5\' 10"  (1.778 m)    Intake/Output Summary (Last 24 hours) at 05/29/2022 2335 Last data filed at 05/29/2022 2300 Gross per 24 hour  Intake 396.03 ml  Output 3150 ml  Net -2753.97 ml      05/29/2022   11:05 PM 05/29/2022    2:34 PM 12/17/2019   10:58 AM  Last  3 Weights  Weight (lbs) 347 lb 3.2 oz 347 lb 333 lb  Weight (kg) 157.489 kg 157.398 kg 151.048 kg     Body mass index is 49.82 kg/m.  General:  Well nourished, well developed, in no acute distress HEENT: normal Neck: no JVD Vascular: No carotid bruits; Distal pulses 2+ bilaterally Cardiac:  normal S1, S2; RRR; no murmur  Lungs:  clear to auscultation bilaterally, no wheezing, rhonchi or rales  Abd: soft, nontender, no hepatomegaly  Ext: no edema Musculoskeletal:  No deformities, BUE and BLE strength normal and equal Skin: warm and dry  Neuro:  CNs 2-12 intact, no focal abnormalities noted Psych:  Normal affect   EKG:  The EKG was personally reviewed and demonstrates:  Afib  Relevant CV Studies:  Echo 2014  Left ventricle: The cavity size was normal. Systolic  function was normal. The estimated ejection fraction was in  the range of 55% to 60%. Wall motion was normal; there were  no regional wall motion abnormalities.   Impressions:   - No cardiac source of embolism was identified, but cannot    be ruled out on the basis of this examination.  Recommendations:  Consider transesophageal echocardiography  if clinically indicated.             Transthoracic  echocardiography.  M-mode, complete 2D, spectral Doppler,  and color Doppler.  Patient status:  Inpatient.  Location:  Echo laboratory.    Laboratory Data:  High Sensitivity Troponin:   Recent Labs  Lab 05/29/22 1450 05/29/22 1845 05/29/22 2053  TROPONINIHS 169* 183* 155*     Chemistry Recent Labs  Lab 05/29/22 1450  NA 137  K 4.0  CL 105  CO2 24  GLUCOSE 101*  BUN 11  CREATININE 1.32*  CALCIUM 8.8*  GFRNONAA >60  ANIONGAP 8    Recent Labs  Lab 05/29/22 1450  PROT 7.3  ALBUMIN 3.3*  AST 32  ALT 27  ALKPHOS 62  BILITOT 0.9   Lipids No results for input(s): "CHOL", "TRIG", "HDL", "LABVLDL", "LDLCALC", "CHOLHDL" in the last 168 hours.  Hematology Recent Labs  Lab 05/29/22 1450  WBC 6.1  RBC  4.93  HGB 15.1  HCT 45.3  MCV 91.9  MCH 30.6  MCHC 33.3  RDW 13.6  PLT 306   Thyroid  Recent Labs  Lab 05/29/22 2053  TSH 3.826    BNP Recent Labs  Lab 05/29/22 1450  BNP 234.6*    DDimer No results for input(s): "DDIMER" in the last 168 hours.   Radiology/Studies:  DG Chest 2 View  Result Date: 05/29/2022 CLINICAL DATA:  Shortness of breath. EXAM: CHEST - 2 VIEW COMPARISON:  September 06, 2021. FINDINGS: Stable cardiomegaly. Minimal left basilar subsegmental atelectasis or scarring is noted. Right lung is clear. The visualized skeletal structures are unremarkable. IMPRESSION: Minimal left basilar subsegmental atelectasis or scarring. Electronically Signed   By: Marijo Conception M.D.   On: 05/29/2022 15:22     Assessment and Plan:   # Elevated Troponin # New onset Afib RVR # Heart Failure  -Elevated troponin most likely due to demand from Afib RVR- unlikely ACS -Telemetry -Echo in AM for WMA  -Acute HF most likely tachycardia induced from Afib RVR -Give IV Lasix 40 BID -Strict I/O; keep K>4 and Mg >2 -Serial weight -Check TSH -For Afib RVR- start [place]. Okay to start apixaban as unlikely to be cath this admission. Currently he is on heparin -For rate control, will consider TEE/DCCV in AM -Currently on diltiazem drip, will try to wean off and give metoprolol 12.5 Q6 -Encourage weight loss  For questions or updates, please contact [place] Please consult [email or web address] for contact info under    Signed, Jaci Lazier, MD  05/29/2022 11:35 PM

## 2022-05-30 ENCOUNTER — Inpatient Hospital Stay (HOSPITAL_COMMUNITY): Payer: Commercial Managed Care - PPO

## 2022-05-30 ENCOUNTER — Telehealth (HOSPITAL_COMMUNITY): Payer: Self-pay | Admitting: Pharmacy Technician

## 2022-05-30 ENCOUNTER — Other Ambulatory Visit (HOSPITAL_COMMUNITY): Payer: Self-pay

## 2022-05-30 DIAGNOSIS — I5031 Acute diastolic (congestive) heart failure: Secondary | ICD-10-CM

## 2022-05-30 DIAGNOSIS — I4891 Unspecified atrial fibrillation: Secondary | ICD-10-CM | POA: Diagnosis not present

## 2022-05-30 LAB — BASIC METABOLIC PANEL
Anion gap: 11 (ref 5–15)
BUN: 10 mg/dL (ref 6–20)
CO2: 26 mmol/L (ref 22–32)
Calcium: 8.9 mg/dL (ref 8.9–10.3)
Chloride: 101 mmol/L (ref 98–111)
Creatinine, Ser: 1.26 mg/dL — ABNORMAL HIGH (ref 0.61–1.24)
GFR, Estimated: >60 mL/min (ref >60)
Glucose, Bld: 132 mg/dL — ABNORMAL HIGH (ref 70–99)
Potassium: 3.3 mmol/L — ABNORMAL LOW (ref 3.5–5.1)
Sodium: 138 mmol/L (ref 135–145)

## 2022-05-30 LAB — ECHOCARDIOGRAM COMPLETE
Height: 70 in
S' Lateral: 3 cm
Weight: 5555.2 oz

## 2022-05-30 LAB — MAGNESIUM: Magnesium: 2 mg/dL (ref 1.7–2.4)

## 2022-05-30 LAB — PHOSPHORUS: Phosphorus: 3.5 mg/dL (ref 2.5–4.6)

## 2022-05-30 LAB — TROPONIN I (HIGH SENSITIVITY): Troponin I (High Sensitivity): 183 ng/L (ref <18)

## 2022-05-30 LAB — HIV ANTIBODY (ROUTINE TESTING W REFLEX): HIV Screen 4th Generation wRfx: NONREACTIVE

## 2022-05-30 MED ORDER — FUROSEMIDE 10 MG/ML IJ SOLN
40.0000 mg | Freq: Two times a day (BID) | INTRAMUSCULAR | Status: DC
  Administered 2022-05-30 – 2022-06-01 (×5): 40 mg via INTRAVENOUS
  Filled 2022-05-30 (×5): qty 4

## 2022-05-30 MED ORDER — DILTIAZEM HCL 60 MG PO TABS
30.0000 mg | ORAL_TABLET | Freq: Four times a day (QID) | ORAL | Status: DC
  Administered 2022-05-30 – 2022-05-31 (×4): 30 mg via ORAL
  Filled 2022-05-30 (×4): qty 1

## 2022-05-30 MED ORDER — POTASSIUM CHLORIDE CRYS ER 20 MEQ PO TBCR
40.0000 meq | EXTENDED_RELEASE_TABLET | Freq: Once | ORAL | Status: AC
  Administered 2022-05-30: 40 meq via ORAL

## 2022-05-30 MED ORDER — IRBESARTAN 150 MG PO TABS
150.0000 mg | ORAL_TABLET | Freq: Every day | ORAL | Status: DC
  Administered 2022-05-30: 150 mg via ORAL
  Filled 2022-05-30: qty 1

## 2022-05-30 NOTE — Progress Notes (Addendum)
PROGRESS NOTE  Anthony Delgado TDD:[id number] DOB: [birth year] DOA: 05/29/2022 PCP: Anthony Brine, FNP   LOS: 1 day   Brief Narrative / Interim history: 46 year old male with history of significant of morbid obesity, HTN, ran out of his medication about 3 months ago due to PCP leaving and closing his practice and patient having difficulty establishing care, comes into the hospital with worsening bilateral lower extremity edema, dyspnea, chest tightness.  He was found to be in A-fib with RVR, significantly fluid overloaded and was admitted to the hospital.  He was placed on diltiazem infusion, Eliquis and cardiology was consulted. He tells me his weight is usually in the 330s, it was as high as 350s when he saw his PCP for new visit.  Subjective / 24h Interval events: He is feeling better this morning.  Appreciates that his weight has improved  Assesement and Plan: Principal problem PAF, RVR-appreciate cardiology follow-up.  He was started on diltiazem infusion, rates are better this morning, start diltiazem p.o.  Patient was also started on anticoagulation with Eliquis, seems to be tolerating well, continue  Active problems Acute on chronic diastolic CHF-prior echo was done in 2014, showed normal EF.  A 2D echo has been ordered and currently pending.  Remains significantly fluid overloaded, his weight is 247 pounds this morning, continue IV Lasix  Acute hypoxic respiratory failure-due to #1&2, on 2 L this morning, wean off as tolerated  Essential hypertension-continue diltiazem, has been started on irbesartan.  Obesity, morbid-BMI 49.8.  He would benefit from weight loss.  Scheduled Meds:  apixaban  5 mg Oral BID   diltiazem  30 mg Oral Q6H   furosemide  40 mg Intravenous BID   irbesartan  150 mg Oral Daily   Continuous Infusions: PRN Meds:.acetaminophen, mouth rinse, polyethylene glycol  Current Outpatient Medications  Medication Instructions   albuterol (PROVENTIL HFA;VENTOLIN HFA)  108 (90 Base) MCG/ACT inhaler 2 puffs, Inhalation, Every 4 hours PRN   amLODipine (NORVASC) 10 mg, Oral, Daily   aspirin EC 81 mg, Oral, Daily   furosemide (LASIX) 40 mg, Oral, 2 times daily   traMADol (ULTRAM) 50 mg, Oral, Every 6 hours PRN   UNABLE TO FIND Anthony Delgado was admitted to [facility] from 04/02/13 to 04/05/13.  He is medically stable to return to work.    Diet Orders (From admission, onward)     Start     Ordered   05/29/22 2130  Diet Heart Room service appropriate? Yes; Fluid consistency: Thin  Diet effective now       Question Answer Comment  Room service appropriate? Yes   Fluid consistency: Thin      05/29/22 2129            DVT prophylaxis:  apixaban (ELIQUIS) tablet 5 mg   Lab Results  Component Value Date   PLT 323 05/29/2022      Code Status: Full Code  Family Communication: no family at bedside   Status is: Inpatient  Remains inpatient appropriate because: Needs diuresis, echo, rate control  Level of care: Progressive  Consultants:  Cardiology   Objective: Vitals:   05/30/22 0445 05/30/22 0530 05/30/22 0811 05/30/22 0827  BP: (!) 150/105 (!) 159/116 (!) 138/123   Pulse:    84  Resp:   19   Temp:   97.8 F (36.6 C)   TempSrc:   Oral   SpO2: 94% 95% 96%   Weight:      Height:  Intake/Output Summary (Last 24 hours) at 05/30/2022 1003 Last data filed at 05/30/2022 0814 Gross per 24 hour  Intake 431.03 ml  Output 3700 ml  Net -3268.97 ml   Wt Readings from Last 3 Encounters:  05/29/22 (!) 157.5 kg  12/17/19 (!) 151 kg  12/15/19 (!) 150.8 kg    Examination: Constitutional: NAD Eyes: no scleral icterus ENMT: Mucous membranes are moist.  Neck: normal, supple Respiratory: The breath sounds due to body habitus but overall clear, no wheezing Cardiovascular: Regular rate and rhythm, no murmurs / rubs / gallops.  2+ pitting lower extremity edema Abdomen: non distended, no tenderness. Bowel sounds positive.   Musculoskeletal: no clubbing / cyanosis.  Skin: no rashes Neurologic: non focal   Data Reviewed: I have independently reviewed following labs and imaging studies   CBC Recent Labs  Lab 05/29/22 1450 05/29/22 2313  WBC 6.1 6.3  HGB 15.1 16.1  HCT 45.3 46.5  PLT 306 323  MCV 91.9 88.2  MCH 30.6 30.6  MCHC 33.3 34.6  RDW 13.6 13.3  LYMPHSABS 2.4  --   MONOABS 0.5  --   EOSABS 0.2  --   BASOSABS 0.1  --     Recent Labs  Lab 05/29/22 1450 05/29/22 2053 05/29/22 2313  NA 137  --  138  K 4.0  --  3.3*  CL 105  --  101  CO2 24  --  26  GLUCOSE 101*  --  132*  BUN 11  --  10  CREATININE 1.32*  --  1.26*  CALCIUM 8.8*  --  8.9  AST 32  --   --   ALT 27  --   --   ALKPHOS 62  --   --   BILITOT 0.9  --   --   ALBUMIN 3.3*  --   --   MG  --   --  2.0  TSH  --  3.826  --   BNP 234.6*  --   --     ------------------------------------------------------------------------------------------------------------------ No results for input(s): "CHOL", "HDL", "LDLCALC", "TRIG", "CHOLHDL", "LDLDIRECT" in the last 72 hours.  Lab Results  Component Value Date   HGBA1C 6.0 (H) 12/15/2019   ------------------------------------------------------------------------------------------------------------------ Recent Labs    05/29/22 2053  TSH 3.826    Cardiac Enzymes No results for input(s): "CKMB", "TROPONINI", "MYOGLOBIN" in the last 168 hours.  Invalid input(s): "CK" ------------------------------------------------------------------------------------------------------------------    Component Value Date/Time   BNP 234.6 (H) 05/29/2022 1450    CBG: No results for input(s): "GLUCAP" in the last 168 hours.  Recent Results (from the past 240 hour(s))  Resp Panel by RT-PCR (Flu A&B, Covid) Anterior Nasal Swab     Status: None   Collection Time: 05/29/22  6:57 PM   Specimen: Anterior Nasal Swab  Result Value Ref Range Status   SARS Coronavirus 2 by RT PCR NEGATIVE NEGATIVE  Final    Comment: (NOTE) SARS-CoV-2 target nucleic acids are NOT DETECTED.  The SARS-CoV-2 RNA is generally detectable in upper respiratory specimens during the acute phase of infection. The lowest concentration of SARS-CoV-2 viral copies this assay can detect is 138 copies/mL. A negative result does not preclude SARS-Cov-2 infection and should not be used as the sole basis for treatment or other patient management decisions. A negative result may occur with  improper specimen collection/handling, submission of specimen other than nasopharyngeal swab, presence of viral mutation(s) within the areas targeted by this assay, and inadequate number of viral copies(<138 copies/mL). A negative  result must be combined with clinical observations, patient history, and epidemiological information. The expected result is Negative.  Fact Sheet for Patients:  EntrepreneurPulse.com.au  Fact Sheet for Healthcare Providers:  IncredibleEmployment.be  This test is no t yet approved or cleared by the [place] FDA and  has been authorized for detection and/or diagnosis of SARS-CoV-2 by FDA under an Emergency Use Authorization (EUA). This EUA will remain  in effect (meaning this test can be used) for the duration of the COVID-19 declaration under Section 564(b)(1) of the Act, 21 U.S.C.section 360bbb-3(b)(1), unless the authorization is terminated  or revoked sooner.       Influenza A by PCR NEGATIVE NEGATIVE Final   Influenza B by PCR NEGATIVE NEGATIVE Final    Comment: (NOTE) The Xpert Xpress SARS-CoV-2/FLU/RSV plus assay is intended as an aid in the diagnosis of influenza from Nasopharyngeal swab specimens and should not be used as a sole basis for treatment. Nasal washings and aspirates are unacceptable for Xpert Xpress SARS-CoV-2/FLU/RSV testing.  Fact Sheet for Patients: EntrepreneurPulse.com.au  Fact Sheet for Healthcare  Providers: IncredibleEmployment.be  This test is not yet approved or cleared by the [place] FDA and has been authorized for detection and/or diagnosis of SARS-CoV-2 by FDA under an Emergency Use Authorization (EUA). This EUA will remain in effect (meaning this test can be used) for the duration of the COVID-19 declaration under Section 564(b)(1) of the Act, 21 U.S.C. section 360bbb-3(b)(1), unless the authorization is terminated or revoked.  Performed at [facility], [place] [address] [place], [place] [address]      Radiology Studies: DG Chest 2 View  Result Date: 05/29/2022 CLINICAL DATA:  Shortness of breath. EXAM: CHEST - 2 VIEW COMPARISON:  September 06, 2021. FINDINGS: Stable cardiomegaly. Minimal left basilar subsegmental atelectasis or scarring is noted. Right lung is clear. The visualized skeletal structures are unremarkable. IMPRESSION: Minimal left basilar subsegmental atelectasis or scarring. Electronically Signed   By: Marijo Conception M.D.   On: 05/29/2022 15:22     Marzetta Board, MD, PhD [facility]  Between 7 am - 7 pm I am available, please contact me via Amion (for emergencies) or Securechat (non urgent messages)  Between 7 pm - 7 am I am not available, please contact night coverage MD/APP via Amion

## 2022-05-30 NOTE — Progress Notes (Signed)
Rounding Note    Patient Name: Anthony Delgado Date of Encounter: 05/30/2022  [facility] Cardiologist: New  Subjective   No CP or dyspnea; much improved compared to admission  Inpatient Medications    Scheduled Meds:  apixaban  5 mg Oral BID   metoprolol tartrate  12.5 mg Oral BID   Continuous Infusions:  diltiazem (CARDIZEM) infusion 5 mg/hr (05/30/22 0600)   PRN Meds: acetaminophen, mouth rinse, polyethylene glycol   Vital Signs    Vitals:   05/30/22 0445 05/30/22 0530 05/30/22 0811 05/30/22 0827  BP: (!) 150/105 (!) 159/116 (!) 138/123   Pulse:    84  Resp:   19   Temp:   97.8 F (36.6 C)   TempSrc:   Oral   SpO2: 94% 95% 96%   Weight:      Height:        Intake/Output Summary (Last 24 hours) at 05/30/2022 0926 Last data filed at 05/30/2022 0814 Gross per 24 hour  Intake 431.03 ml  Output 3700 ml  Net -3268.97 ml      05/29/2022   11:05 PM 05/29/2022    2:34 PM 12/17/2019   10:58 AM  Last 3 Weights  Weight (lbs) 347 lb 3.2 oz 347 lb 333 lb  Weight (kg) 157.489 kg 157.398 kg 151.048 kg      Telemetry    Atrial fibrillation - Personally Reviewed   Physical Exam   GEN: No acute distress.   Neck: supple Cardiac: irregular Respiratory: Clear to auscultation bilaterally. GI: Soft, nontender, non-distended  MS: 2+ edema Neuro:  Nonfocal  Psych: Normal affect   Labs    High Sensitivity Troponin:   Recent Labs  Lab 05/29/22 1450 05/29/22 1845 05/29/22 2053 05/29/22 2313  TROPONINIHS 169* 183* 155* 183*     Chemistry Recent Labs  Lab 05/29/22 1450 05/29/22 2313  NA 137 138  K 4.0 3.3*  CL 105 101  CO2 24 26  GLUCOSE 101* 132*  BUN 11 10  CREATININE 1.32* 1.26*  CALCIUM 8.8* 8.9  MG  --  2.0  PROT 7.3  --   ALBUMIN 3.3*  --   AST 32  --   ALT 27  --   ALKPHOS 62  --   BILITOT 0.9  --   GFRNONAA >60 >60  ANIONGAP 8 11     Hematology Recent Labs  Lab 05/29/22 1450 05/29/22 2313  WBC 6.1 6.3  RBC 4.93 5.27   HGB 15.1 16.1  HCT 45.3 46.5  MCV 91.9 88.2  MCH 30.6 30.6  MCHC 33.3 34.6  RDW 13.6 13.3  PLT 306 323   Thyroid  Recent Labs  Lab 05/29/22 2053  TSH 3.826    BNP Recent Labs  Lab 05/29/22 1450  BNP 234.6*     Radiology    DG Chest 2 View  Result Date: 05/29/2022 CLINICAL DATA:  Shortness of breath. EXAM: CHEST - 2 VIEW COMPARISON:  September 06, 2021. FINDINGS: Stable cardiomegaly. Minimal left basilar subsegmental atelectasis or scarring is noted. Right lung is clear. The visualized skeletal structures are unremarkable. IMPRESSION: Minimal left basilar subsegmental atelectasis or scarring. Electronically Signed   By: Lupita Raider M.D.   On: 05/29/2022 15:22      Patient Profile     46 y.o. male with morbid obesity, hypertension, noncompliance being evaluated for atrial fibrillation.  Assessment & Plan    1 new onset atrial fibrillation-patient remains in atrial fibrillation this morning.  TSH is  normal.  Plan echocardiogram to assess LV function.  Discontinue metoprolol.  Change IV Cardizem to 30 mg by mouth every 6 hours.  Follow heart rate and adjust as needed.  Continue apixaban.  We will plan TEE guided cardioversion later this week if rate difficult to control.  Otherwise could potentially discharge with cardioversion after 21 days of therapeutic anticoagulation.  2 acute diastolic congestive heart failure-patient presented with acute CHF symptoms likely secondary to new onset atrial fibrillation.  Await echocardiogram.  We will continue diuresis with Lasix 40 mg IV twice daily.  Follow renal function.  3 hypertension-blood pressure is elevated.  Add Avapro 150 mg daily.  Follow blood pressure and adjust as needed.  4 obesity/snoring-we will need outpatient sleep study as this could be contributing to atrial fibrillation.  5 minimally elevated troponin-no clear trend and not consistent with acute coronary syndrome.  No plans for further ischemia evaluation.  For  questions or updates, please contact [place] Please consult [email or web address] for contact info under        Signed, Kirk Ruths, MD  05/30/2022, 9:26 AM

## 2022-05-30 NOTE — Progress Notes (Signed)
  Echocardiogram 2D Echocardiogram has been performed.  Anthony Delgado 05/30/2022, 4:02 PM

## 2022-05-30 NOTE — TOC Benefit Eligibility Note (Signed)
Patient Teacher, English as a foreign language completed.    The patient is currently admitted and upon discharge could be taking Eliquis 5 mg.  The current 30 day co-pay is $30.00.   The patient is insured through [place] Patient Advocate Specialist [place] Patient Advocate Team Direct Number: [phone]  Fax: [phone]

## 2022-05-30 NOTE — Discharge Instructions (Signed)

## 2022-05-30 NOTE — Telephone Encounter (Signed)
Pharmacy Patient Advocate Encounter  Insurance verification completed.    The patient is insured through [employer]   The patient is currently admitted and ran test claims for the following: Eliquis.  Copays and coinsurance results were relayed to Inpatient clinical team.      

## 2022-05-30 NOTE — Care Management (Signed)
  Transition of Care (TOC) Screening Note   Patient Details  Name: Anthony Delgado Date of Birth: 05-24-1976   Transition of Care [facility]) CM/SW Contact:    Bethena Roys, RN Phone Number: 05/30/2022, 2:38 PM    Transition of Care Department [facility]) has reviewed the patient and no TOC needs have been identified at this time. We will continue to monitor patient advancement through interdisciplinary progression rounds. If new patient transition needs arise, please place a TOC consult.

## 2022-05-31 ENCOUNTER — Telehealth (HOSPITAL_COMMUNITY): Payer: Self-pay

## 2022-05-31 ENCOUNTER — Other Ambulatory Visit (HOSPITAL_COMMUNITY): Payer: Self-pay

## 2022-05-31 DIAGNOSIS — I4891 Unspecified atrial fibrillation: Secondary | ICD-10-CM | POA: Diagnosis not present

## 2022-05-31 LAB — MAGNESIUM: Magnesium: 1.9 mg/dL (ref 1.7–2.4)

## 2022-05-31 LAB — BASIC METABOLIC PANEL
Anion gap: 13 (ref 5–15)
BUN: 16 mg/dL (ref 6–20)
CO2: 28 mmol/L (ref 22–32)
Calcium: 9.1 mg/dL (ref 8.9–10.3)
Chloride: 98 mmol/L (ref 98–111)
Creatinine, Ser: 1.32 mg/dL — ABNORMAL HIGH (ref 0.61–1.24)
GFR, Estimated: >60 mL/min (ref >60)
Glucose, Bld: 90 mg/dL (ref 70–99)
Potassium: 3.4 mmol/L — ABNORMAL LOW (ref 3.5–5.1)
Sodium: 139 mmol/L (ref 135–145)

## 2022-05-31 MED ORDER — SPIRONOLACTONE 12.5 MG HALF TABLET
12.5000 mg | ORAL_TABLET | Freq: Every day | ORAL | Status: DC
  Administered 2022-05-31 – 2022-06-03 (×4): 12.5 mg via ORAL
  Filled 2022-05-31 (×4): qty 1

## 2022-05-31 MED ORDER — POTASSIUM CHLORIDE CRYS ER 20 MEQ PO TBCR
40.0000 meq | EXTENDED_RELEASE_TABLET | ORAL | Status: AC
  Administered 2022-05-31 (×2): 40 meq via ORAL
  Filled 2022-05-31 (×2): qty 2

## 2022-05-31 MED ORDER — MAGNESIUM SULFATE 2 GM/50ML IV SOLN
2.0000 g | Freq: Once | INTRAVENOUS | Status: AC
  Administered 2022-05-31: 2 g via INTRAVENOUS
  Filled 2022-05-31: qty 50

## 2022-05-31 MED ORDER — SACUBITRIL-VALSARTAN 24-26 MG PO TABS
1.0000 | ORAL_TABLET | Freq: Two times a day (BID) | ORAL | Status: DC
  Administered 2022-05-31 – 2022-06-01 (×4): 1 via ORAL
  Filled 2022-05-31 (×4): qty 1

## 2022-05-31 MED ORDER — CARVEDILOL 6.25 MG PO TABS
6.2500 mg | ORAL_TABLET | Freq: Two times a day (BID) | ORAL | Status: DC
  Administered 2022-05-31 – 2022-06-01 (×2): 6.25 mg via ORAL
  Filled 2022-05-31 (×2): qty 1

## 2022-05-31 MED ORDER — DAPAGLIFLOZIN PROPANEDIOL 10 MG PO TABS
10.0000 mg | ORAL_TABLET | Freq: Every day | ORAL | Status: DC
  Administered 2022-05-31 – 2022-06-03 (×4): 10 mg via ORAL
  Filled 2022-05-31 (×4): qty 1

## 2022-05-31 MED ORDER — SODIUM CHLORIDE 0.9 % IV SOLN: INTRAVENOUS | Status: DC

## 2022-05-31 NOTE — Progress Notes (Signed)
PROGRESS NOTE    Anthony Delgado  [id number] DOB: May 27, 1976 DOA: 05/29/2022 PCP: Minette Brine, FNP  Chief Complaint  Patient presents with   Leg Swelling    Brief Narrative:  46 year old male with history of significant of morbid obesity, HTN, ran out of his medication about 3 months ago due to PCP leaving and closing his practice and patient having difficulty establishing care, comes into the hospital with worsening bilateral lower extremity edema, dyspnea, chest tightness.  He was found to be in Jaque Dacy-fib with RVR, significantly fluid overloaded and was admitted to the hospital.  He was placed on diltiazem infusion, Eliquis and cardiology was consulted. He tells me his weight is usually in the 330s, it was as high as 350s when he saw his PCP for new visit.  Assessment & Plan:   Principal Problem:   New onset atrial fibrillation (HCC)   Assessment and Plan: Paroxysmal Atrial Fibrillation with RVR TSH wnl, echo with decreased EF as noted below, mildly dilated LA Cardiology planning on TEE cardioversion 10/5 Coreg for rate control eliquis Will continue to monitor and adjust prn   Heart failure with reduced ejection fraction, exacerbation  History of Diastolic HF Echo with EF [id number], moderately decreased function, global hypokinesis, RVSF low normal (see report)  Volume overloaded on exam  Strict I/O, daily weights Appreciate cardiology recs -> entresto, coreg, spironolactone, farxiga -> repeat cardioversion 3-6 months, needs ischemic evaluation if LV remains decreased   Acute hypoxic respiratory failure-due to #1&2, currently on RA   Essential hypertension continue meds as above   Obesity, morbid-BMI 49.8.  He would benefit from weight loss.     DVT prophylaxis: eliquis Code Status: full Family Communication: none Disposition:   Status is: Inpatient Remains inpatient appropriate because: continued need for diuresis, cardiology procedures, etc   Consultants:   cardiology  Procedures:  Echo IMPRESSIONS     1. Left ventricular ejection fraction, by estimation, is 35 to 40%. The  left ventricle has moderately decreased function. The left ventricle  demonstrates global hypokinesis. There is mild concentric left ventricular  hypertrophy. Left ventricular  diastolic function could not be evaluated.   2. Right ventricular systolic function is low normal. The right  ventricular size is mildly enlarged. There is normal pulmonary artery  systolic pressure. The estimated right ventricular systolic pressure is  [id number] mmHg.   3. Left atrial size was mildly dilated.   4. Right atrial size was severely dilated.   5. The mitral valve is normal in structure. Trivial mitral valve  regurgitation.   6. The tricuspid valve is abnormal. Tricuspid valve regurgitation is mild  to moderate.   7. The aortic valve is tricuspid. Aortic valve regurgitation is not  visualized.   8. Aortic dilatation noted. There is borderline dilatation of the aortic  root, measuring 38 mm.   9. The inferior vena cava is dilated in size with >50% respiratory  variability, suggesting right atrial pressure of 8 mmHg.   Antimicrobials:  Anti-infectives (From admission, onward)    None       Subjective: Feeling gradually better  Objective: Vitals:   05/30/22 2349 05/31/22 0014 05/31/22 0642 05/31/22 1015  BP: (!) 137/98  (!) 139/111 (!) 137/93  Pulse: 70  (!) 50 83  Resp: 20  20   Temp: 98.1 F (36.7 C)  97.9 F (36.6 C)   TempSrc: Oral  Oral   SpO2: 95% 95% 97% 96%  Weight:   (!) 156.7 kg   Height:  Intake/Output Summary (Last 24 hours) at 05/31/2022 1655 Last data filed at 05/31/2022 1604 Gross per 24 hour  Intake 754.32 ml  Output 2355 ml  Net -1600.68 ml   Filed Weights   05/29/22 1434 05/29/22 2305 05/31/22 0642  Weight: (!) 157.4 kg (!) 157.5 kg (!) 156.7 kg    Examination:  General exam: Appears calm and comfortable , sitting up in  chair Respiratory system: unlabored Cardiovascular system: irregularly irregular, in the 100's, 90's on tele Gastrointestinal system: Abdomen is nondistended, soft and nontender. Central nervous system: Alert and oriented. No focal neurological deficits. Extremities: bilateral LE edema    Data Reviewed: I have personally reviewed following labs and imaging studies  CBC: Recent Labs  Lab 05/29/22 1450 05/29/22 2313  WBC 6.1 6.3  NEUTROABS 2.9  --   HGB 15.1 16.1  HCT 45.3 46.5  MCV 91.9 88.2  PLT 306 161    Basic Metabolic Panel: Recent Labs  Lab 05/29/22 1450 05/29/22 2313 05/31/22 0224  NA 137 138 139  K 4.0 3.3* 3.4*  CL 105 101 98  CO2 24 26 28   GLUCOSE 101* 132* 90  BUN 11 10 16   CREATININE 1.32* 1.26* 1.32*  CALCIUM 8.8* 8.9 9.1  MG  --  2.0 1.9  PHOS  --  3.5  --     GFR: Estimated Creatinine Clearance: 103.1 mL/min (Kennan Detter) (by C-G formula based on SCr of 1.32 mg/dL (H)).  Liver Function Tests: Recent Labs  Lab 05/29/22 1450  AST 32  ALT 27  ALKPHOS 62  BILITOT 0.9  PROT 7.3  ALBUMIN 3.3*    CBG: No results for input(s): "GLUCAP" in the last 168 hours.   Recent Results (from the past 240 hour(s))  Resp Panel by RT-PCR (Flu Rein Popov&B, Covid) Anterior Nasal Swab     Status: None   Collection Time: 05/29/22  6:57 PM   Specimen: Anterior Nasal Swab  Result Value Ref Range Status   SARS Coronavirus 2 by RT PCR NEGATIVE NEGATIVE Final    Comment: (NOTE) SARS-CoV-2 target nucleic acids are NOT DETECTED.  The SARS-CoV-2 RNA is generally detectable in upper respiratory specimens during the acute phase of infection. The lowest concentration of SARS-CoV-2 viral copies this assay can detect is 138 copies/mL. Raudel Bazen negative result does not preclude SARS-Cov-2 infection and should not be used as the sole basis for treatment or other patient management decisions. Stpehanie Montroy negative result may occur with  improper specimen collection/handling, submission of specimen  other than nasopharyngeal swab, presence of viral mutation(s) within the areas targeted by this assay, and inadequate number of viral copies(<138 copies/mL). Virdie Penning negative result must be combined with clinical observations, patient history, and epidemiological information. The expected result is Negative.  Fact Sheet for Patients:  EntrepreneurPulse.com.au  Fact Sheet for Healthcare Providers:  IncredibleEmployment.be  This test is no t yet approved or cleared by the [place] FDA and  has been authorized for detection and/or diagnosis of SARS-CoV-2 by FDA under an Emergency Use Authorization (EUA). This EUA will remain  in effect (meaning this test can be used) for the duration of the COVID-19 declaration under Section 564(b)(1) of the Act, 21 U.S.C.section 360bbb-3(b)(1), unless the authorization is terminated  or revoked sooner.       Influenza Letrell Attwood by PCR NEGATIVE NEGATIVE Final   Influenza B by PCR NEGATIVE NEGATIVE Final    Comment: (NOTE) The Xpert Xpress SARS-CoV-2/FLU/RSV plus assay is intended as an aid in the diagnosis of influenza from Nasopharyngeal  swab specimens and should not be used as Rian Koon sole basis for treatment. Nasal washings and aspirates are unacceptable for Xpert Xpress SARS-CoV-2/FLU/RSV testing.  Fact Sheet for Patients: EntrepreneurPulse.com.au  Fact Sheet for Healthcare Providers: IncredibleEmployment.be  This test is not yet approved or cleared by the [place] FDA and has been authorized for detection and/or diagnosis of SARS-CoV-2 by FDA under an Emergency Use Authorization (EUA). This EUA will remain in effect (meaning this test can be used) for the duration of the COVID-19 declaration under Section 564(b)(1) of the Act, 21 U.S.C. section 360bbb-3(b)(1), unless the authorization is terminated or revoked.  Performed at [facility], [place] [address], [place],  [place] [address]          Radiology Studies: ECHOCARDIOGRAM COMPLETE  Result Date: 05/30/2022    ECHOCARDIOGRAM REPORT   Patient Name:   Darryle Mahfouz Date of Exam: 05/30/2022 Medical Rec #:  [id number]      Height:       70.0 in Accession #:    [id number]     Weight:       347.2 lb Date of Birth:  [birth year]       BSA:          2.639 m Patient Age:    13 years       BP:           128/88 mmHg Patient Gender: M              HR:           94 bpm. Exam Location:  Inpatient Procedure: 2D Echo, Cardiac Doppler and Color Doppler Indications:    [id number] Acute diastolic (congestive) heart failure  History:        Patient has no prior history of Echocardiogram examinations.                 TIA, Arrythmias:Atrial Fibrillation; Signs/Symptoms:Chest Pain.  Sonographer:    Greer Pickerel Sonographer#2:  Raquel Sarna Senior Referring Phys: Kayleen Memos  Sonographer Comments: Technically difficult study due to poor echo windows and patient is obese. IMPRESSIONS  1. Left ventricular ejection fraction, by estimation, is 35 to 40%. The left ventricle has moderately decreased function. The left ventricle demonstrates global hypokinesis. There is mild concentric left ventricular hypertrophy. Left ventricular diastolic function could not be evaluated.  2. Right ventricular systolic function is low normal. The right ventricular size is mildly enlarged. There is normal pulmonary artery systolic pressure. The estimated right ventricular systolic pressure is [id number] mmHg.  3. Left atrial size was mildly dilated.  4. Right atrial size was severely dilated.  5. The mitral valve is normal in structure. Trivial mitral valve regurgitation.  6. The tricuspid valve is abnormal. Tricuspid valve regurgitation is mild to moderate.  7. The aortic valve is tricuspid. Aortic valve regurgitation is not visualized.  8. Aortic dilatation noted. There is borderline dilatation of the aortic root, measuring 38 mm.  9. The inferior vena cava is dilated in size with >50%  respiratory variability, suggesting right atrial pressure of 8 mmHg. FINDINGS  Left Ventricle: Left ventricular ejection fraction, by estimation, is 35 to 40%. The left ventricle has moderately decreased function. The left ventricle demonstrates global hypokinesis. The left ventricular internal cavity size was normal in size. There is mild concentric left ventricular hypertrophy. Left ventricular diastolic function could not be evaluated due to atrial fibrillation. Left ventricular diastolic function could not be evaluated. Right Ventricle: The right ventricular size is mildly  enlarged. Right vetricular wall thickness was not well visualized. Right ventricular systolic function is low normal. There is normal pulmonary artery systolic pressure. The tricuspid regurgitant velocity is 1.93 m/s, and with an assumed right atrial pressure of 8 mmHg, the estimated right ventricular systolic pressure is [id number] mmHg. Left Atrium: Left atrial size was mildly dilated. Right Atrium: Right atrial size was severely dilated. Pericardium: There is no evidence of pericardial effusion. Mitral Valve: The mitral valve is normal in structure. Trivial mitral valve regurgitation. Tricuspid Valve: The tricuspid valve is abnormal. Tricuspid valve regurgitation is mild to moderate. Aortic Valve: The aortic valve is tricuspid. Aortic valve regurgitation is not visualized. Pulmonic Valve: The pulmonic valve was normal in structure. Pulmonic valve regurgitation is trivial. Aorta: Aortic dilatation noted. There is borderline dilatation of the aortic root, measuring 38 mm. Venous: The inferior vena cava is dilated in size with greater than 50% respiratory variability, suggesting right atrial pressure of 8 mmHg. IAS/Shunts: No atrial level shunt detected by color flow Doppler.  LEFT VENTRICLE PLAX 2D LVIDd:         4.20 cm LVIDs:         3.00 cm LV PW:         1.20 cm LV IVS:        1.20 cm  RIGHT VENTRICLE RV S prime:     12.00 cm/s TAPSE (M-mode):  1.6 cm LEFT ATRIUM             Index        RIGHT ATRIUM           Index LA diam:        3.45 cm 1.31 cm/m   RA Area:     30.90 cm LA Vol (A2C):   84.8 ml 32.14 ml/m  RA Volume:   116.00 ml 43.96 ml/m LA Vol (A4C):   57.5 ml 21.79 ml/m LA Biplane Vol: 69.6 ml 26.38 ml/m  AORTIC VALVE LVOT Vmax:   88.20 cm/s LVOT Vmean:  57.700 cm/s LVOT VTI:    0.133 m  AORTA Ao Root diam: 3.80 cm Ao Asc diam:  3.10 cm TRICUSPID VALVE TR Peak grad:   14.9 mmHg TR Vmax:        193.00 cm/s  SHUNTS Systemic VTI: 0.13 m Mihai Croitoru MD Electronically signed by Sanda Klein MD Signature Date/Time: 05/30/2022/4:24:33 PM    Final         Scheduled Meds:  apixaban  5 mg Oral BID   carvedilol  6.25 mg Oral BID WC   dapagliflozin propanediol  10 mg Oral Daily   furosemide  40 mg Intravenous BID   sacubitril-valsartan  1 tablet Oral BID   spironolactone  12.5 mg Oral Daily   Continuous Infusions:  sodium chloride       LOS: 2 days    Time spent: over 30 min    Fayrene Helper, MD [facility]   To contact the attending provider between 7A-7P or the covering provider during after hours 7P-7A, please log into the web site [email or web address] and access using universal [place] password for that web site. If you do not have the password, please call the hospital operator.  05/31/2022, 4:55 PM

## 2022-05-31 NOTE — Telephone Encounter (Signed)
Anthony Delgado is approved from 05/31/2022 to 05/31/2025.

## 2022-05-31 NOTE — Progress Notes (Signed)
Rounding Note    Patient Name: Anthony Delgado Date of Encounter: 05/31/2022  [place] Cardiologist: New  Subjective   Pt denies CP or dyspnea  Inpatient Medications    Scheduled Meds:  apixaban  5 mg Oral BID   diltiazem  30 mg Oral Q6H   furosemide  40 mg Intravenous BID   irbesartan  150 mg Oral Daily   potassium chloride  40 mEq Oral Q4H   Continuous Infusions:   PRN Meds: acetaminophen, mouth rinse, polyethylene glycol   Vital Signs    Vitals:   05/30/22 1705 05/30/22 2349 05/31/22 0014 05/31/22 0642  BP: (!) 131/100 (!) 137/98  (!) 139/111  Pulse:  70  (!) 50  Resp:  20  20  Temp:  98.1 F (36.7 C)  97.9 F (36.6 C)  TempSrc:  Oral  Oral  SpO2:  95% 95% 97%  Weight:    (!) 156.7 kg  Height:        Intake/Output Summary (Last 24 hours) at 05/31/2022 0843 Last data filed at 05/31/2022 0300 Gross per 24 hour  Intake --  Output 455 ml  Net -455 ml       05/31/2022    6:42 AM 05/29/2022   11:05 PM 05/29/2022    2:34 PM  Last 3 Weights  Weight (lbs) 345 lb 8 oz 347 lb 3.2 oz 347 lb  Weight (kg) 156.718 kg 157.489 kg 157.398 kg      Telemetry    Atrial fibrillation - Personally Reviewed   Physical Exam   GEN: NAD Neck: supple, JVP difficult Cardiac: irregular, no rub Respiratory: CTA GI: Soft, NT/ND MS: 2+ edema Neuro:  Grossly intact Psych: Normal affect   Labs    High Sensitivity Troponin:   Recent Labs  Lab 05/29/22 1450 05/29/22 1845 05/29/22 2053 05/29/22 2313  TROPONINIHS 169* 183* 155* 183*      Chemistry Recent Labs  Lab 05/29/22 1450 05/29/22 2313 05/31/22 0224  NA 137 138 139  K 4.0 3.3* 3.4*  CL 105 101 98  CO2 24 26 28   GLUCOSE 101* 132* 90  BUN 11 10 16   CREATININE 1.32* 1.26* 1.32*  CALCIUM 8.8* 8.9 9.1  MG  --  2.0 1.9  PROT 7.3  --   --   ALBUMIN 3.3*  --   --   AST 32  --   --   ALT 27  --   --   ALKPHOS 62  --   --   BILITOT 0.9  --   --   GFRNONAA >60 >60 >60  ANIONGAP 8 11 13        Hematology Recent Labs  Lab 05/29/22 1450 05/29/22 2313  WBC 6.1 6.3  RBC 4.93 5.27  HGB 15.1 16.1  HCT 45.3 46.5  MCV 91.9 88.2  MCH 30.6 30.6  MCHC 33.3 34.6  RDW 13.6 13.3  PLT 306 323    Thyroid  Recent Labs  Lab 05/29/22 2053  TSH 3.826     BNP Recent Labs  Lab 05/29/22 1450  BNP 234.6*      Radiology    ECHOCARDIOGRAM COMPLETE  Result Date: 05/30/2022    ECHOCARDIOGRAM REPORT   Patient Name:   [name] Date of Exam: 05/30/2022 Medical Rec #:  [id number]      Height:       70.0 in Accession #:    [id number]     Weight:       347.2 lb Date  of Birth:  March 07, 1976       BSA:          2.639 m Patient Age:    54 years       BP:           128/88 mmHg Patient Gender: M              HR:           94 bpm. Exam Location:  Inpatient Procedure: 2D Echo, Cardiac Doppler and Color Doppler Indications:    K48.18 Acute diastolic (congestive) heart failure  History:        Patient has no prior history of Echocardiogram examinations.                 TIA, Arrythmias:Atrial Fibrillation; Signs/Symptoms:Chest Pain.  Sonographer:    Greer Pickerel Sonographer#2:  Raquel Sarna Senior Referring Phys: Kayleen Memos  Sonographer Comments: Technically difficult study due to poor echo windows and patient is obese. IMPRESSIONS  1. Left ventricular ejection fraction, by estimation, is 35 to 40%. The left ventricle has moderately decreased function. The left ventricle demonstrates global hypokinesis. There is mild concentric left ventricular hypertrophy. Left ventricular diastolic function could not be evaluated.  2. Right ventricular systolic function is low normal. The right ventricular size is mildly enlarged. There is normal pulmonary artery systolic pressure. The estimated right ventricular systolic pressure is 56.3 mmHg.  3. Left atrial size was mildly dilated.  4. Right atrial size was severely dilated.  5. The mitral valve is normal in structure. Trivial mitral valve regurgitation.  6. The  tricuspid valve is abnormal. Tricuspid valve regurgitation is mild to moderate.  7. The aortic valve is tricuspid. Aortic valve regurgitation is not visualized.  8. Aortic dilatation noted. There is borderline dilatation of the aortic root, measuring 38 mm.  9. The inferior vena cava is dilated in size with >50% respiratory variability, suggesting right atrial pressure of 8 mmHg. FINDINGS  Left Ventricle: Left ventricular ejection fraction, by estimation, is 35 to 40%. The left ventricle has moderately decreased function. The left ventricle demonstrates global hypokinesis. The left ventricular internal cavity size was normal in size. There is mild concentric left ventricular hypertrophy. Left ventricular diastolic function could not be evaluated due to atrial fibrillation. Left ventricular diastolic function could not be evaluated. Right Ventricle: The right ventricular size is mildly enlarged. Right vetricular wall thickness was not well visualized. Right ventricular systolic function is low normal. There is normal pulmonary artery systolic pressure. The tricuspid regurgitant velocity is 1.93 m/s, and with an assumed right atrial pressure of 8 mmHg, the estimated right ventricular systolic pressure is 14.9 mmHg. Left Atrium: Left atrial size was mildly dilated. Right Atrium: Right atrial size was severely dilated. Pericardium: There is no evidence of pericardial effusion. Mitral Valve: The mitral valve is normal in structure. Trivial mitral valve regurgitation. Tricuspid Valve: The tricuspid valve is abnormal. Tricuspid valve regurgitation is mild to moderate. Aortic Valve: The aortic valve is tricuspid. Aortic valve regurgitation is not visualized. Pulmonic Valve: The pulmonic valve was normal in structure. Pulmonic valve regurgitation is trivial. Aorta: Aortic dilatation noted. There is borderline dilatation of the aortic root, measuring 38 mm. Venous: The inferior vena cava is dilated in size with greater than  50% respiratory variability, suggesting right atrial pressure of 8 mmHg. IAS/Shunts: No atrial level shunt detected by color flow Doppler.  LEFT VENTRICLE PLAX 2D LVIDd:         4.20 cm LVIDs:  3.00 cm LV PW:         1.20 cm LV IVS:        1.20 cm  RIGHT VENTRICLE RV S prime:     12.00 cm/s TAPSE (M-mode): 1.6 cm LEFT ATRIUM             Index        RIGHT ATRIUM           Index LA diam:        3.45 cm 1.31 cm/m   RA Area:     30.90 cm LA Vol (A2C):   84.8 ml 32.14 ml/m  RA Volume:   116.00 ml 43.96 ml/m LA Vol (A4C):   57.5 ml 21.79 ml/m LA Biplane Vol: 69.6 ml 26.38 ml/m  AORTIC VALVE LVOT Vmax:   88.20 cm/s LVOT Vmean:  57.700 cm/s LVOT VTI:    0.133 m  AORTA Ao Root diam: 3.80 cm Ao Asc diam:  3.10 cm TRICUSPID VALVE TR Peak grad:   14.9 mmHg TR Vmax:        193.00 cm/s  SHUNTS Systemic VTI: 0.13 m Dani Gobble Croitoru MD Electronically signed by Sanda Klein MD Signature Date/Time: 05/30/2022/4:24:33 PM    Final    DG Chest 2 View  Result Date: 05/29/2022 CLINICAL DATA:  Shortness of breath. EXAM: CHEST - 2 VIEW COMPARISON:  September 06, 2021. FINDINGS: Stable cardiomegaly. Minimal left basilar subsegmental atelectasis or scarring is noted. Right lung is clear. The visualized skeletal structures are unremarkable. IMPRESSION: Minimal left basilar subsegmental atelectasis or scarring. Electronically Signed   By: Marijo Conception M.D.   On: 05/29/2022 15:22      Patient Profile     46 y.o. male with morbid obesity, hypertension, noncompliance being evaluated for atrial fibrillation.  Assessment & Plan    1 cardiomyopathy-patient is noted to have moderate LV dysfunction on his echocardiogram.  Etiology not clear.  However he is significantly hypertensive and has not taken blood pressure medications at home.  He also has been in atrial fibrillation and rate possibly elevated at home and tachycardia could be contributing.  For now we will treat medically.  Discontinue Avapro and treat with Entresto  24/26 twice daily.  Discontinue Cardizem and treat with carvedilol 6.25 mg twice daily.  Titrate medications as tolerated.  Add spironolactone 12.5 mg daily.  Add Farxiga 10 mg daily.  Plan is to proceed with TEE guided cardioversion tomorrow.  Repeat echocardiogram 3 to 6 months after medications fully titrated in sinus rhythm reestablished.  If LV function remains decreased would need ischemia evaluation.  2 new onset atrial fibrillation-patient remains in atrial fibrillation this morning.  TSH is normal.  Discontinue Cardizem and treat with carvedilol for rate control in the setting of reduced LV function.  Continue apixaban.  Proceed with TEE guided cardioversion tomorrow.  3 acute combined systolic/diastolic congestive heart failure-volume status is improving.  Continue Lasix at present dose.  Add spironolactone and Farxiga as outlined above.  Follow renal function.  4 hypertension-blood pressure remains elevated.  Medication adjustments as outlined above.  5 obesity/snoring-we will need outpatient sleep study as this could be contributing to atrial fibrillation.  6 minimally elevated troponin-no clear trend and not consistent with acute coronary syndrome.  No plans for further ischemia evaluation at this time.  For questions or updates, please contact [place] Please consult [email or web address] for contact info under        Signed, Kirk Ruths, MD  05/31/2022, 8:43 AM

## 2022-05-31 NOTE — TOC Benefit Eligibility Note (Signed)
Patient Teacher, English as a foreign language completed.    The current 30 day co-pay is, $30.00 for [place].   Rush Barer requires Prior [employer].  The patient is insured through [place] (ADV)   Otis Brace, [place] Patient Advocate Specialist [place] Patient Advocate Team Direct Number: [phone]  Fax: [phone]

## 2022-05-31 NOTE — Progress Notes (Signed)
Mobility Specialist Progress Note    05/31/22 1241  Mobility  Activity Ambulated independently in hallway  Activity Response Tolerated fair  Distance Ambulated (ft) 360 ft  $Mobility charge 1 Mobility  Level of Assistance Standby assist, set-up cues, supervision of patient - no hands on  Assistive Device None   Pre-Mobility: 102 HR, 95% SpO2 During Mobility: 133 HR, >/=87% SpO2 on RA Post-Mobility: 107 HR, 88% SpO2  Pt received in chair and agreeable. C/o leg tightness towards end of walk and stopped to stretch his calves. SOB with exertion. Returned to chair with call bell in reach.    Hildred Alamin Mobility Specialist

## 2022-05-31 NOTE — Progress Notes (Signed)
Pt states he does not need assistance w/ CPAP. RT will cont to monitor as needed.

## 2022-05-31 NOTE — Telephone Encounter (Signed)
Pharmacy Patient Advocate Encounter  Insurance verification completed.    The patient is insured through [place] (ADV)    The patient is currently admitted and ran test claims for the following: Jardiance, Farxiga and Entresto.   Entresto 24-26mg  required a prior [place].   Submitted prior auth via CMM and is APPROVED. Key # [id number]  PA Case ID: [id number]  Copays and coinsurance results were relayed to Inpatient clinical team.

## 2022-05-31 NOTE — Telephone Encounter (Signed)
Entresto prior [place] approval letter scanned to profile.

## 2022-05-31 NOTE — Progress Notes (Signed)
Heart Failure Nurse Navigator Progress Note  Will attempt interview tomorrow per pt request.   Barriers- Medication compliance New dx AF RVR, new HFrEF.   Planned TEE/DCCV 10/5.     Pricilla Holm, MSN, RN Heart Failure Nurse Navigator

## 2022-05-31 NOTE — Progress Notes (Signed)
Heart Failure Stewardship Pharmacist Progress Note   PCP: Minette Brine, FNP PCP-Cardiologist: None    HPI:  46 yo M with PMH of HTN, obesity, and medication noncompliance.   Presented to the ED on 10/2 with shortness of breath, LE edema, orthopnea, and chest tightness. Found to be in afib RVR. CXR with stable cardiomegaly, mild congestion, L basilar atelectasis or scarring, R lung clear. BNP 234 but may be falsely low due to obesity. ECHO on 10/3 showed LVEF 35-40% with mild LVH, RV low normal, trivial MR, mild-moderate TR, LA mildly dilated, RA severely dilated, RA pressure of 8 mmHg. TEE/DCCV scheduled for 10/5.  Current HF Medications: Diuretic: furosemide 40 mg IV BID Beta Blocker: carvedilol 6.25 mg BID ACE/ARB/ARNI: Entresto 24/26 mg BID MRA: spironolactone 12.5 mg daily SGLT2i: Farxiga 10 mg daily  Prior to admission HF Medications: None  Pertinent Lab Values: Serum creatinine 1.32, BUN 16, Potassium 3.4, Sodium 139, BNP 234.6, Magnesium 1.9  Vital Signs: Weight: 345 lbs (admission weight: 347 lbs) Blood pressure: 130/90s  Heart rate: 90s  I/O: -1L yesterday; net -3.7L  Medication Assistance / Insurance Benefits Check: Does the patient have prescription insurance?  Yes Type of insurance plan: [employer] Pharmacist, community   Outpatient Pharmacy:  Prior to admission outpatient pharmacy: [facility] Is the patient willing to use [place] at discharge? Yes Is the patient willing to transition their outpatient pharmacy to utilize a [facility] outpatient pharmacy?   Pending    Assessment: 1. Acute systolic CHF (LVEF 16-10%), due to presumed NICM, afib RVR, med noncompliance. TEE/DCCV 10/5. NYHA class III symptoms. - Continue furosemide 40 mg IV BID. Strict I/Os and daily weights. KCl 40 mEq x 2 ordered for replacement. Recommend adding magnesium 2g IV x 1. Keep K>4 and Mag>2.  - Agree with stopping diltiazem and starting carvedilol 6.25 mg BID - Agree with  transitioning from irbesartan to Entresto 24/26 mg BID - Agree with adding spironolactone 12.5 mg daily - Agree with adding Farxiga 10 mg daily   Plan: 1) Medication changes recommended at this time: - Agree with changes - Magnesium 2g IV x1  2) Patient assistance: - Farxiga copay $30 - copay card lowers to $0 per month - Eliquis copay $30 - copay card lowers to $10 per month - Entresto requires prior authorization, approved today, copay $30 - copay card lowers to $10 per fill  3)  Education  - To be completed prior to discharge  Kerby Nora, PharmD, BCPS Heart Failure Stewardship Pharmacist Phone [phone]

## 2022-05-31 NOTE — Progress Notes (Signed)
Heart Failure Nurse Navigator Progress Note  Attempted to interview to assess for HV TOC readiness. Pt currently sleeping with nasal CPAP on. Co-worker, Dawn, at bedside. Will attempt to interview after lunch per request for rest.   Pricilla Holm, MSN, RN Heart Failure Nurse Navigator

## 2022-06-01 ENCOUNTER — Inpatient Hospital Stay (HOSPITAL_COMMUNITY): Payer: Commercial Managed Care - PPO | Admitting: Anesthesiology

## 2022-06-01 ENCOUNTER — Encounter (HOSPITAL_COMMUNITY): Payer: Self-pay | Admitting: Internal Medicine

## 2022-06-01 ENCOUNTER — Encounter (HOSPITAL_COMMUNITY)
Admission: EM | Disposition: A | Discharge status: Home / Self Care | Payer: Self-pay | Source: Home / Self Care | Attending: Family Medicine

## 2022-06-01 ENCOUNTER — Inpatient Hospital Stay (HOSPITAL_COMMUNITY): Payer: Commercial Managed Care - PPO

## 2022-06-01 DIAGNOSIS — I088 Other rheumatic multiple valve diseases: Secondary | ICD-10-CM

## 2022-06-01 DIAGNOSIS — I361 Nonrheumatic tricuspid (valve) insufficiency: Secondary | ICD-10-CM | POA: Diagnosis not present

## 2022-06-01 DIAGNOSIS — I480 Paroxysmal atrial fibrillation: Secondary | ICD-10-CM

## 2022-06-01 DIAGNOSIS — I429 Cardiomyopathy, unspecified: Secondary | ICD-10-CM

## 2022-06-01 DIAGNOSIS — I4891 Unspecified atrial fibrillation: Secondary | ICD-10-CM | POA: Diagnosis not present

## 2022-06-01 DIAGNOSIS — I509 Heart failure, unspecified: Secondary | ICD-10-CM

## 2022-06-01 DIAGNOSIS — I110 Hypertensive heart disease with heart failure: Secondary | ICD-10-CM

## 2022-06-01 HISTORY — PX: CARDIOVERSION: SHX1299

## 2022-06-01 HISTORY — PX: TEE WITHOUT CARDIOVERSION: SHX5443

## 2022-06-01 LAB — CBC WITH DIFFERENTIAL/PLATELET
Abs Immature Granulocytes: 0.01 10*3/uL (ref 0.00–0.07)
Basophils Absolute: 0.1 10*3/uL (ref 0.0–0.1)
Basophils Relative: 1 %
Eosinophils Absolute: 0.3 10*3/uL (ref 0.0–0.5)
Eosinophils Relative: 5 %
HCT: 48.9 % (ref 39.0–52.0)
Hemoglobin: 17 g/dL (ref 13.0–17.0)
Immature Granulocytes: 0 %
Lymphocytes Relative: 43 %
Lymphs Abs: 2.7 10*3/uL (ref 0.7–4.0)
MCH: 31 pg (ref 26.0–34.0)
MCHC: 34.8 g/dL (ref 30.0–36.0)
MCV: 89.1 fL (ref 80.0–100.0)
Monocytes Absolute: 0.5 10*3/uL (ref 0.1–1.0)
Monocytes Relative: 8 %
Neutro Abs: 2.6 10*3/uL (ref 1.7–7.7)
Neutrophils Relative %: 43 %
Platelets: 326 10*3/uL (ref 150–400)
RBC: 5.49 MIL/uL (ref 4.22–5.81)
RDW: 13.3 % (ref 11.5–15.5)
WBC: 6.2 10*3/uL (ref 4.0–10.5)
nRBC: 0 % (ref 0.0–0.2)

## 2022-06-01 LAB — COMPREHENSIVE METABOLIC PANEL
ALT: 24 U/L (ref 0–44)
AST: 35 U/L (ref 15–41)
Albumin: 3.4 g/dL — ABNORMAL LOW (ref 3.5–5.0)
Alkaline Phosphatase: 67 U/L (ref 38–126)
Anion gap: 14 (ref 5–15)
BUN: 16 mg/dL (ref 6–20)
CO2: 24 mmol/L (ref 22–32)
Calcium: 9.1 mg/dL (ref 8.9–10.3)
Chloride: 100 mmol/L (ref 98–111)
Creatinine, Ser: 1.36 mg/dL — ABNORMAL HIGH (ref 0.61–1.24)
GFR, Estimated: >60 mL/min (ref >60)
Glucose, Bld: 99 mg/dL (ref 70–99)
Potassium: 4.2 mmol/L (ref 3.5–5.1)
Sodium: 138 mmol/L (ref 135–145)
Total Bilirubin: 0.8 mg/dL (ref 0.3–1.2)
Total Protein: 8.3 g/dL — ABNORMAL HIGH (ref 6.5–8.1)

## 2022-06-01 LAB — MAGNESIUM: Magnesium: 2.4 mg/dL (ref 1.7–2.4)

## 2022-06-01 LAB — PHOSPHORUS: Phosphorus: 5.3 mg/dL — ABNORMAL HIGH (ref 2.5–4.6)

## 2022-06-01 SURGERY — ECHOCARDIOGRAM, TRANSESOPHAGEAL
Anesthesia: General

## 2022-06-01 MED ORDER — ETOMIDATE 2 MG/ML IV SOLN
INTRAVENOUS | Status: DC | PRN
  Administered 2022-06-01 (×2): 5 mg via INTRAVENOUS

## 2022-06-01 MED ORDER — CARVEDILOL 3.125 MG PO TABS
3.1250 mg | ORAL_TABLET | Freq: Two times a day (BID) | ORAL | Status: DC
  Administered 2022-06-01: 3.125 mg via ORAL
  Filled 2022-06-01: qty 1

## 2022-06-01 MED ORDER — PHENYLEPHRINE 80 MCG/ML (10ML) SYRINGE FOR IV PUSH (FOR BLOOD PRESSURE SUPPORT)
PREFILLED_SYRINGE | INTRAVENOUS | Status: DC | PRN
  Administered 2022-06-01: 80 ug via INTRAVENOUS

## 2022-06-01 MED ORDER — LIDOCAINE 2% (20 MG/ML) 5 ML SYRINGE
INTRAMUSCULAR | Status: DC | PRN
  Administered 2022-06-01: 40 mg via INTRAVENOUS

## 2022-06-01 MED ORDER — PROPOFOL 500 MG/50ML IV EMUL
INTRAVENOUS | Status: DC | PRN
  Administered 2022-06-01: 50 ug/kg/min via INTRAVENOUS

## 2022-06-01 NOTE — CV Procedure (Signed)
   Transesophageal Echocardiogram  Indications: Paroxysmal atrial fibrillation  Time out performed  During this procedure the patient was administered propofol under anesthesiology supervision to achieve and maintain moderate sedation.  The patient's heart rate, blood pressure, and oxygen saturation are monitored continuously during the procedure.   Findings:  Left Ventricle: Ejection fraction severely reduced 30%  Mitral Valve: Mild regurgitation  Aortic Valve: Trileaflet, mild regurgitation  Tricuspid Valve: Moderate to severe tricuspid regurgitation  Left Atrium: Moderately dilated, no left atrial appendage thrombus  Right Atrium: Severely dilated  Intraatrial septum: No evidence of shunt by color flow Doppler  Bubble Contrast Study: Not performed  Candee Furbish, MD      Electrical Cardioversion Procedure Note Gorje Iyer [id number] [birth year]  Procedure: Electrical Cardioversion Indications:  Atrial Fibrillation  Time Out: Verified patient identification, verified procedure,medications/allergies/relevent history reviewed, required imaging and test results available.  Performed  Procedure Details  The patient was NPO after midnight. Anesthesia was administered at the beside with IV propofol.  Cardioversion was performed with synchronized biphasic defibrillation via AP pads with 200 joules with towel compression.  3 attempt(s) were performed.  The patient converted to normal sinus rhythm only for 2-3 beats duration and then went promptly back to atrial fibrillation. The patient tolerated the procedure well   IMPRESSION:  After each shock, only 2-3 beats of sinus rhythm were noted and then he promptly went back into atrial fibrillation.    Shadrach Bartunek 06/01/2022, 3:01 PM

## 2022-06-01 NOTE — Progress Notes (Signed)
Rounding Note    Patient Name: Anthony Delgado Date of Encounter: 06/01/2022  [place] Cardiologist: New  Subjective   No CP or dyspnea  Inpatient Medications    Scheduled Meds:  apixaban  5 mg Oral BID   carvedilol  6.25 mg Oral BID WC   dapagliflozin propanediol  10 mg Oral Daily   furosemide  40 mg Intravenous BID   sacubitril-valsartan  1 tablet Oral BID   spironolactone  12.5 mg Oral Daily   Continuous Infusions:  sodium chloride      PRN Meds: acetaminophen, mouth rinse, polyethylene glycol   Vital Signs    Vitals:   05/31/22 1015 05/31/22 1815 05/31/22 2035 06/01/22 0534  BP: (!) 137/93 (!) 134/106 (!) 114/95 (!) 129/93  Pulse: 83  92 95  Resp:   18 18  Temp:   98.2 F (36.8 C) 98.1 F (36.7 C)  TempSrc:   Oral Oral  SpO2: 96%  94% 96%  Weight:    (!) 154 kg  Height:        Intake/Output Summary (Last 24 hours) at 06/01/2022 0838 Last data filed at 05/31/2022 2040 Gross per 24 hour  Intake 754.32 ml  Output 2240 ml  Net -1485.68 ml       06/01/2022    5:34 AM 05/31/2022    6:42 AM 05/29/2022   11:05 PM  Last 3 Weights  Weight (lbs) 339 lb 6.4 oz 345 lb 8 oz 347 lb 3.2 oz  Weight (kg) 153.951 kg 156.718 kg 157.489 kg      Telemetry    Atrial fibrillation - Personally Reviewed   Physical Exam   GEN: NAD, obese Neck: supple Cardiac: irregular Respiratory: CTA; no wheeze GI: Soft, NT/ND, no masses MS: 2+ edema Neuro:  No focal findings Psych: Normal affect   Labs    High Sensitivity Troponin:   Recent Labs  Lab 05/29/22 1450 05/29/22 1845 05/29/22 2053 05/29/22 2313  TROPONINIHS 169* 183* 155* 183*      Chemistry Recent Labs  Lab 05/29/22 1450 05/29/22 2313 05/31/22 0224 06/01/22 0217  NA 137 138 139 138  K 4.0 3.3* 3.4* 4.2  CL 105 101 98 100  CO2 24 26 28 24   GLUCOSE 101* 132* 90 99  BUN 11 10 16 16   CREATININE 1.32* 1.26* 1.32* 1.36*  CALCIUM 8.8* 8.9 9.1 9.1  MG  --  2.0 1.9 2.4  PROT 7.3  --    --  8.3*  ALBUMIN 3.3*  --   --  3.4*  AST 32  --   --  35  ALT 27  --   --  24  ALKPHOS 62  --   --  67  BILITOT 0.9  --   --  0.8  GFRNONAA >60 >60 >60 >60  ANIONGAP 8 11 13 14       Hematology Recent Labs  Lab 05/29/22 1450 05/29/22 2313 06/01/22 0217  WBC 6.1 6.3 6.2  RBC 4.93 5.27 5.49  HGB 15.1 16.1 17.0  HCT 45.3 46.5 48.9  MCV 91.9 88.2 89.1  MCH 30.6 30.6 31.0  MCHC 33.3 34.6 34.8  RDW 13.6 13.3 13.3  PLT 306 323 326    Thyroid  Recent Labs  Lab 05/29/22 2053  TSH 3.826     BNP Recent Labs  Lab 05/29/22 1450  BNP 234.6*      Radiology    ECHOCARDIOGRAM COMPLETE  Result Date: 05/30/2022    ECHOCARDIOGRAM REPORT  Patient Name:   Anthony Delgado Date of Exam: 05/30/2022 Medical Rec #:  [id number]      Height:       70.0 in Accession #:    [id number]     Weight:       347.2 lb Date of Birth:  Jan 31, 1976       BSA:          2.639 m Patient Age:    46 years       BP:           128/88 mmHg Patient Gender: M              HR:           94 bpm. Exam Location:  Inpatient Procedure: 2D Echo, Cardiac Doppler and Color Doppler Indications:    [id number] Acute diastolic (congestive) heart failure  History:        Patient has no prior history of Echocardiogram examinations.                 TIA, Arrythmias:Atrial Fibrillation; Signs/Symptoms:Chest Pain.  Sonographer:    Greer Pickerel Sonographer#2:  Raquel Sarna Senior Referring Phys: Kayleen Memos  Sonographer Comments: Technically difficult study due to poor echo windows and patient is obese. IMPRESSIONS  1. Left ventricular ejection fraction, by estimation, is 35 to 40%. The left ventricle has moderately decreased function. The left ventricle demonstrates global hypokinesis. There is mild concentric left ventricular hypertrophy. Left ventricular diastolic function could not be evaluated.  2. Right ventricular systolic function is low normal. The right ventricular size is mildly enlarged. There is normal pulmonary artery systolic pressure.  The estimated right ventricular systolic pressure is [id number] mmHg.  3. Left atrial size was mildly dilated.  4. Right atrial size was severely dilated.  5. The mitral valve is normal in structure. Trivial mitral valve regurgitation.  6. The tricuspid valve is abnormal. Tricuspid valve regurgitation is mild to moderate.  7. The aortic valve is tricuspid. Aortic valve regurgitation is not visualized.  8. Aortic dilatation noted. There is borderline dilatation of the aortic root, measuring 38 mm.  9. The inferior vena cava is dilated in size with >50% respiratory variability, suggesting right atrial pressure of 8 mmHg. FINDINGS  Left Ventricle: Left ventricular ejection fraction, by estimation, is 35 to 40%. The left ventricle has moderately decreased function. The left ventricle demonstrates global hypokinesis. The left ventricular internal cavity size was normal in size. There is mild concentric left ventricular hypertrophy. Left ventricular diastolic function could not be evaluated due to atrial fibrillation. Left ventricular diastolic function could not be evaluated. Right Ventricle: The right ventricular size is mildly enlarged. Right vetricular wall thickness was not well visualized. Right ventricular systolic function is low normal. There is normal pulmonary artery systolic pressure. The tricuspid regurgitant velocity is 1.93 m/s, and with an assumed right atrial pressure of 8 mmHg, the estimated right ventricular systolic pressure is [id number] mmHg. Left Atrium: Left atrial size was mildly dilated. Right Atrium: Right atrial size was severely dilated. Pericardium: There is no evidence of pericardial effusion. Mitral Valve: The mitral valve is normal in structure. Trivial mitral valve regurgitation. Tricuspid Valve: The tricuspid valve is abnormal. Tricuspid valve regurgitation is mild to moderate. Aortic Valve: The aortic valve is tricuspid. Aortic valve regurgitation is not visualized. Pulmonic Valve: The pulmonic  valve was normal in structure. Pulmonic valve regurgitation is trivial. Aorta: Aortic dilatation noted. There is borderline dilatation of the aortic root, measuring 38 mm. Venous:  The inferior vena cava is dilated in size with greater than 50% respiratory variability, suggesting right atrial pressure of 8 mmHg. IAS/Shunts: No atrial level shunt detected by color flow Doppler.  LEFT VENTRICLE PLAX 2D LVIDd:         4.20 cm LVIDs:         3.00 cm LV PW:         1.20 cm LV IVS:        1.20 cm  RIGHT VENTRICLE RV S prime:     12.00 cm/s TAPSE (M-mode): 1.6 cm LEFT ATRIUM             Index        RIGHT ATRIUM           Index LA diam:        3.45 cm 1.31 cm/m   RA Area:     30.90 cm LA Vol (A2C):   84.8 ml 32.14 ml/m  RA Volume:   116.00 ml 43.96 ml/m LA Vol (A4C):   57.5 ml 21.79 ml/m LA Biplane Vol: 69.6 ml 26.38 ml/m  AORTIC VALVE LVOT Vmax:   88.20 cm/s LVOT Vmean:  57.700 cm/s LVOT VTI:    0.133 m  AORTA Ao Root diam: 3.80 cm Ao Asc diam:  3.10 cm TRICUSPID VALVE TR Peak grad:   14.9 mmHg TR Vmax:        193.00 cm/s  SHUNTS Systemic VTI: 0.13 m Dani Gobble Croitoru MD Electronically signed by Sanda Klein MD Signature Date/Time: 05/30/2022/4:24:33 PM    Final       Patient Profile     46 y.o. male with morbid obesity, hypertension, noncompliance being evaluated for atrial fibrillation.  Assessment & Plan    1 cardiomyopathy-patient is noted to have moderate LV dysfunction on his echocardiogram.  Etiology not clear.  However he is significantly hypertensive and has not taken blood pressure medications at home.  He also has been in atrial fibrillation and rate possibly elevated at home and tachycardia could be contributing.  For now we will treat medically.  Continue carvedilol, Entresto, spironolactone and [place].  Plan is to proceed with TEE guided cardioversion today.  We will plan to repeat echocardiogram 3 to 6 months after medications fully titrated and sinus reestablish.  If LV function remains  decreased at that time would need ischemia evaluation.   2 new onset atrial fibrillation-patient remains in atrial fibrillation this morning.  TSH is normal.  Continue carvedilol and apixaban.  Proceed with TEE guided cardioversion tomorrow.  3 acute combined systolic/diastolic congestive heart failure-patient remains mildly volume overloaded though symptomatically improved.  Continue Lasix, spironolactone and Farxiga at present dose.  Follow renal function.  4 hypertension-blood pressure remains elevated.  We will follow for now.  If remains elevated will increase Entresto and carvedilol.  5 obesity/snoring-we will need outpatient sleep study as this could be contributing to atrial fibrillation.  6 minimally elevated troponin-no clear trend and not consistent with acute coronary syndrome.  No plans for further ischemia evaluation at this time.  For questions or updates, please contact [place] Please consult [email or web address] for contact info under        Signed, Kirk Ruths, MD  06/01/2022, 8:38 AM

## 2022-06-01 NOTE — Interval H&P Note (Signed)
History and Physical Interval Note:  06/01/2022 2:34 PM  Anthony Delgado  has presented today for surgery, with the diagnosis of AFIB.  The various methods of treatment have been discussed with the patient and family. After consideration of risks, benefits and other options for treatment, the patient has consented to  Procedure(s): TRANSESOPHAGEAL ECHOCARDIOGRAM (TEE) (N/A) CARDIOVERSION (N/A) as a surgical intervention.  The patient's history has been reviewed, patient examined, no change in status, stable for surgery.  I have reviewed the patient's chart and labs.  Questions were answered to the patient's satisfaction.     [employer]

## 2022-06-01 NOTE — Anesthesia Preprocedure Evaluation (Addendum)
Anesthesia Evaluation  Patient identified by MRN, date of birth, ID band Patient awake    Reviewed: Allergy & Precautions, NPO status , Patient's Chart, lab work & pertinent test results  History of Anesthesia Complications Negative for: history of anesthetic complications  Airway Mallampati: II  TM Distance: >3 FB Neck ROM: Full    Dental  (+) Dental Advisory Given, Teeth Intact   Pulmonary neg pulmonary ROS,    Pulmonary exam normal        Cardiovascular hypertension, Pt. on medications +CHF  + dysrhythmias Atrial Fibrillation + Valvular Problems/Murmurs  Rhythm:Irregular Rate:Tachycardia   '23 TTE - EF 35 to 40%. The left ventricle has moderately decreased function. The left ventricle demonstrates global hypokinesis. There is mild concentric left ventricular hypertrophy. The right ventricular size is mildly enlarged. Left atrial size was mildly dilated. Right atrial size was severely dilated. Trivial mitral valve regurgitation.  Tricuspid valve regurgitation is mild to moderate. Aortic dilatation noted. There is borderline dilatation of the aortic root, measuring 38 mm.     Neuro/Psych TIAnegative psych ROS   GI/Hepatic negative GI ROS, Neg liver ROS,   Endo/Other  Morbid obesity  Renal/GU negative Renal ROS     Musculoskeletal negative musculoskeletal ROS (+)   Abdominal   Peds  Hematology negative hematology ROS (+)   Anesthesia Other Findings   Reproductive/Obstetrics                            Anesthesia Physical Anesthesia Plan  ASA: 3  Anesthesia Plan: General and MAC   Post-op Pain Management: Minimal or no pain anticipated   Induction: Intravenous  PONV Risk Score and Plan: 2 and Treatment may vary due to age or medical condition and Propofol infusion  Airway Management Planned: Natural Airway and Mask  Additional Equipment: None  Intra-op Plan:   Post-operative  Plan:   Informed Consent: I have reviewed the patients History and Physical, chart, labs and discussed the procedure including the risks, benefits and alternatives for the proposed anesthesia with the patient or authorized representative who has indicated his/her understanding and acceptance.       Plan Discussed with: CRNA and Anesthesiologist  Anesthesia Plan Comments: (May begin procedure as MAC with conversion to GA as indicated by procedure )       Anesthesia Quick Evaluation

## 2022-06-01 NOTE — Transfer of Care (Signed)
Immediate Anesthesia Transfer of Care Note  Patient: Anthony Delgado  Procedure(s) Performed: TRANSESOPHAGEAL ECHOCARDIOGRAM (TEE) CARDIOVERSION  Patient Location: Endoscopy Unit  Anesthesia Type:MAC and General  Level of Consciousness: awake  Airway & Oxygen Therapy: Patient Spontanous Breathing and Patient connected to face mask oxygen  Post-op Assessment: Report given to RN and Post -op Vital signs reviewed and stable  Post vital signs: Reviewed and stable  Last Vitals:  Vitals Value Taken Time  BP 100/74 06/01/22 1505  Temp    Pulse 70 06/01/22 1508  Resp 31 06/01/22 1508  SpO2 95 % 06/01/22 1508  Vitals shown include unvalidated device data.  Last Pain:  Vitals:   06/01/22 1325  TempSrc: Tympanic  PainSc: 0-No pain      Patients Stated Pain Goal: 0 (37/44/51 4604)  Complications: No notable events documented.

## 2022-06-01 NOTE — Progress Notes (Signed)
Heart Failure Stewardship Pharmacist Progress Note   PCP: Minette Brine, FNP PCP-Cardiologist: None    HPI:  46 yo M with PMH of HTN, obesity, and medication noncompliance.   Presented to the ED on 10/2 with shortness of breath, LE edema, orthopnea, and chest tightness. Found to be in afib RVR. CXR with stable cardiomegaly, mild congestion, L basilar atelectasis or scarring, R lung clear. BNP 234 but may be falsely low due to obesity. ECHO on 10/3 showed LVEF 35-40% with mild LVH, RV low normal, trivial MR, mild-moderate TR, LA mildly dilated, RA severely dilated, RA pressure of 8 mmHg. TEE/DCCV scheduled for 10/5.  Current HF Medications: Diuretic: furosemide 40 mg IV BID Beta Blocker: carvedilol 6.25 mg BID ACE/ARB/ARNI: Entresto 24/26 mg BID MRA: spironolactone 12.5 mg daily SGLT2i: Farxiga 10 mg daily  Prior to admission HF Medications: None  Pertinent Lab Values: Serum creatinine 1.36, BUN 16, Potassium 4.2, Sodium 138, BNP 234.6, Magnesium 2.4  Vital Signs: Weight: 339 lbs (admission weight: 347 lbs) Blood pressure: 120/90s  Heart rate: 90s - afib I/O: -2.5L yesterday; net -5.2L  Medication Assistance / Insurance Benefits Check: Does the patient have prescription insurance?  Yes Type of insurance plan: [employer] Pharmacist, community   Outpatient Pharmacy:  Prior to admission [facility] pharmacy: [facility] Is the patient willing to use [place] at discharge? Yes Is the patient willing to transition their outpatient pharmacy to utilize a [facility] outpatient pharmacy?   Pending    Assessment: 1. Acute systolic CHF (LVEF 14-78%), due to presumed NICM, afib RVR, med noncompliance. TEE/DCCV 10/5. Ischemic eval in 3-6 months if EF still down after GDMT optimized. NYHA class III symptoms. - Continue furosemide 40 mg IV BID. Strict I/Os and daily weights. Keep K>4 and Mag>2.  - Continue carvedilol 6.25 mg BID - Continue Entresto 24/26 mg BID, consider increasing to  49/51 mg BID if BP remains elevated - Continue spironolactone 12.5 mg daily, consider increasing to target 25 mg daily - Continue Farxiga 10 mg daily   Plan: 1) Medication changes recommended at this time: - Increase spironolactone to 25 mg daily  2) Patient assistance: - Farxiga copay $30 - copay card lowers to $0 per month - Eliquis copay $30 - copay card lowers to $10 per month - Entresto requires prior authorization, approved today, copay $30 - copay card lowers to $10 per fill  3)  Education  - To be completed prior to discharge  Kerby Nora, PharmD, BCPS Heart Failure Stewardship Pharmacist Phone [phone]

## 2022-06-01 NOTE — Progress Notes (Signed)
At [phone] RN notified by CCMD tech that patient's heart rate briefly dropped to 37 (strip saved in epic)  then came back up to 70's.  RN went to assess patient, he was napping without cpap on.  Patient aroused easily and was educated that any time he felt like he needed to sleep to put cpap on.  Rosaria Ferries, PA notified, stated she would decrease dose of coreg.

## 2022-06-01 NOTE — Progress Notes (Signed)
PROGRESS NOTE    Anthony Delgado  [id number] DOB: 1975/11/10 DOA: 05/29/2022 PCP: Minette Brine, FNP  Chief Complaint  Patient presents with   Leg Swelling    Brief Narrative:  46 year old male with history of significant of morbid obesity, HTN, ran out of his medication about 3 months ago due to PCP leaving and closing his practice and patient having difficulty establishing care, comes into the hospital with worsening bilateral lower extremity edema, dyspnea, chest tightness.  He was found to be in Rayanne Padmanabhan-fib with RVR, significantly fluid overloaded and was admitted to the hospital.  He was placed on diltiazem infusion, Eliquis and cardiology was consulted. He tells me his weight is usually in the 330s, it was as high as 350s when he saw his PCP for new visit.  Assessment & Plan:   Principal Problem:   New onset atrial fibrillation (HCC)   Assessment and Plan: Paroxysmal Atrial Fibrillation with RVR TSH wnl, echo with decreased EF as noted below, mildly dilated LA Cardiology planning on TEE cardioversion 10/5 (today) Coreg for rate control eliquis Will continue to monitor and adjust prn   Heart failure with reduced ejection fraction, exacerbation  History of Diastolic HF Echo with EF [id number], moderately decreased function, global hypokinesis, RVSF low normal (see report)  Volume overloaded on exam  Strict I/O, daily weights Appreciate cardiology recs -> entresto, coreg, spironolactone, farxiga -> repeat echo 3-6 months, needs ischemic evaluation if LV remains decreased   Acute hypoxic respiratory failure-due to #1&2, currently on RA   Essential hypertension continue meds as above   Obesity, morbid-BMI 49.8.  He would benefit from weight loss.     DVT prophylaxis: eliquis Code Status: full Family Communication: none Disposition:   Status is: Inpatient Remains inpatient appropriate because: continued need for diuresis, cardiology procedures, etc   Consultants:   cardiology  Procedures:  Echo IMPRESSIONS     1. Left ventricular ejection fraction, by estimation, is 35 to 40%. The  left ventricle has moderately decreased function. The left ventricle  demonstrates global hypokinesis. There is mild concentric left ventricular  hypertrophy. Left ventricular  diastolic function could not be evaluated.   2. Right ventricular systolic function is low normal. The right  ventricular size is mildly enlarged. There is normal pulmonary artery  systolic pressure. The estimated right ventricular systolic pressure is  [id number] mmHg.   3. Left atrial size was mildly dilated.   4. Right atrial size was severely dilated.   5. The mitral valve is normal in structure. Trivial mitral valve  regurgitation.   6. The tricuspid valve is abnormal. Tricuspid valve regurgitation is mild  to moderate.   7. The aortic valve is tricuspid. Aortic valve regurgitation is not  visualized.   8. Aortic dilatation noted. There is borderline dilatation of the aortic  root, measuring 38 mm.   9. The inferior vena cava is dilated in size with >50% respiratory  variability, suggesting right atrial pressure of 8 mmHg.   Antimicrobials:  Anti-infectives (From admission, onward)    None       Subjective: No new complaints  Objective: Vitals:   05/31/22 2035 06/01/22 0534 06/01/22 0855 06/01/22 1325  BP: (!) 114/95 (!) 129/93 (!) 125/99 (!) 156/105  Pulse: 92 95 100 99  Resp: 18 18  20   Temp: 98.2 F (36.8 C) 98.1 F (36.7 C)  (!) 96.4 F (35.8 C)  TempSrc: Oral Oral  Tympanic  SpO2: 94% 96%  95%  Weight:  (!) 154 kg  Marland Kitchen)  153.8 kg  Height:    5\' 10"  (1.778 m)    Intake/Output Summary (Last 24 hours) at 06/01/2022 1327 Last data filed at 05/31/2022 2040 Gross per 24 hour  Intake 394.32 ml  Output 1240 ml  Net -845.68 ml   Filed Weights   05/31/22 0642 06/01/22 0534 06/01/22 1325  Weight: (!) 156.7 kg (!) 154 kg (!) 153.8 kg    Examination:  General: No  acute distress. Sitting up in chair. Cardiovascular: irregularly irregular Lungs: unlabored Abdomen: Soft, nontender, nondistended Neurological: Alert and oriented 3. Moves all extremities 4 . Cranial nerves II through XII grossly intact. Extremities: bilateral LE edema present, gradualyl improving  Data Reviewed: I have personally reviewed following labs and imaging studies  CBC: Recent Labs  Lab 05/29/22 1450 05/29/22 2313 06/01/22 0217  WBC 6.1 6.3 6.2  NEUTROABS 2.9  --  2.6  HGB 15.1 16.1 17.0  HCT 45.3 46.5 48.9  MCV 91.9 88.2 89.1  PLT 306 323 [id number]    Basic Metabolic Panel: Recent Labs  Lab 05/29/22 1450 05/29/22 2313 05/31/22 0224 06/01/22 0217  NA 137 138 139 138  K 4.0 3.3* 3.4* 4.2  CL 105 101 98 100  CO2 24 26 28 24   GLUCOSE 101* 132* 90 99  BUN 11 10 16 16   CREATININE 1.32* 1.26* 1.32* 1.36*  CALCIUM 8.8* 8.9 9.1 9.1  MG  --  2.0 1.9 2.4  PHOS  --  3.5  --  5.3*    GFR: Estimated Creatinine Clearance: 98.9 mL/min (Maurene Hollin) (by C-G formula based on SCr of 1.36 mg/dL (H)).  Liver Function Tests: Recent Labs  Lab 05/29/22 1450 06/01/22 0217  AST 32 35  ALT 27 24  ALKPHOS 62 67  BILITOT 0.9 0.8  PROT 7.3 8.3*  ALBUMIN 3.3* 3.4*    CBG: No results for input(s): "GLUCAP" in the last 168 hours.   Recent Results (from the past 240 hour(s))  Resp Panel by RT-PCR (Flu Ethelean Colla&B, Covid) Anterior Nasal Swab     Status: None   Collection Time: 05/29/22  6:57 PM   Specimen: Anterior Nasal Swab  Result Value Ref Range Status   SARS Coronavirus 2 by RT PCR NEGATIVE NEGATIVE Final    Comment: (NOTE) SARS-CoV-2 target nucleic acids are NOT DETECTED.  The SARS-CoV-2 RNA is generally detectable in upper respiratory specimens during the acute phase of infection. The lowest concentration of SARS-CoV-2 viral copies this assay can detect is 138 copies/mL. Timothey Dahlstrom negative result does not preclude SARS-Cov-2 infection and should not be used as the sole basis for  treatment or other patient management decisions. Artemisa Sladek negative result may occur with  improper specimen collection/handling, submission of specimen other than nasopharyngeal swab, presence of viral mutation(s) within the areas targeted by this assay, and inadequate number of viral copies(<138 copies/mL). Lonza Shimabukuro negative result must be combined with clinical observations, patient history, and epidemiological information. The expected result is Negative.  Fact Sheet for Patients:  EntrepreneurPulse.com.au  Fact Sheet for Healthcare Providers:  IncredibleEmployment.be  This test is no t yet approved or cleared by the [place] FDA and  has been authorized for detection and/or diagnosis of SARS-CoV-2 by FDA under an Emergency Use Authorization (EUA). This EUA will remain  in effect (meaning this test can be used) for the duration of the COVID-19 declaration under Section 564(b)(1) of the Act, 21 U.S.C.section 360bbb-3(b)(1), unless the authorization is terminated  or revoked sooner.       Influenza Nona Gracey by PCR  NEGATIVE NEGATIVE Final   Influenza B by PCR NEGATIVE NEGATIVE Final    Comment: (NOTE) The Xpert Xpress SARS-CoV-2/FLU/RSV plus assay is intended as an aid in the diagnosis of influenza from Nasopharyngeal swab specimens and should not be used as Justine Cossin sole basis for treatment. Nasal washings and aspirates are unacceptable for Xpert Xpress SARS-CoV-2/FLU/RSV testing.  Fact Sheet for Patients: EntrepreneurPulse.com.au  Fact Sheet for Healthcare Providers: IncredibleEmployment.be  This test is not yet approved or cleared by the [place] FDA and has been authorized for detection and/or diagnosis of SARS-CoV-2 by FDA under an Emergency Use Authorization (EUA). This EUA will remain in effect (meaning this test can be used) for the duration of the COVID-19 declaration under Section 564(b)(1) of the Act, 21  U.S.C. section 360bbb-3(b)(1), unless the authorization is terminated or revoked.  Performed at [facility], [place] [address], [place], [place] [address]          Radiology Studies: ECHOCARDIOGRAM COMPLETE  Result Date: 05/30/2022    ECHOCARDIOGRAM REPORT   Patient Name:   Anthony Delgado Date of Exam: 05/30/2022 Medical Rec #:  [id number]      Height:       70.0 in Accession #:    [id number]     Weight:       347.2 lb Date of Birth:  1976-01-28       BSA:          2.639 m Patient Age:    43 years       BP:           128/88 mmHg Patient Gender: M              HR:           94 bpm. Exam Location:  Inpatient Procedure: 2D Echo, Cardiac Doppler and Color Doppler Indications:    G38.75 Acute diastolic (congestive) heart failure  History:        Patient has no prior history of Echocardiogram examinations.                 TIA, Arrythmias:Atrial Fibrillation; Signs/Symptoms:Chest Pain.  Sonographer:    Greer Pickerel Sonographer#2:  Raquel Sarna Senior Referring Phys: Kayleen Memos  Sonographer Comments: Technically difficult study due to poor echo windows and patient is obese. IMPRESSIONS  1. Left ventricular ejection fraction, by estimation, is 35 to 40%. The left ventricle has moderately decreased function. The left ventricle demonstrates global hypokinesis. There is mild concentric left ventricular hypertrophy. Left ventricular diastolic function could not be evaluated.  2. Right ventricular systolic function is low normal. The right ventricular size is mildly enlarged. There is normal pulmonary artery systolic pressure. The estimated right ventricular systolic pressure is 64.3 mmHg.  3. Left atrial size was mildly dilated.  4. Right atrial size was severely dilated.  5. The mitral valve is normal in structure. Trivial mitral valve regurgitation.  6. The tricuspid valve is abnormal. Tricuspid valve regurgitation is mild to moderate.  7. The aortic valve is tricuspid. Aortic valve regurgitation is not  visualized.  8. Aortic dilatation noted. There is borderline dilatation of the aortic root, measuring 38 mm.  9. The inferior vena cava is dilated in size with >50% respiratory variability, suggesting right atrial pressure of 8 mmHg. FINDINGS  Left Ventricle: Left ventricular ejection fraction, by estimation, is 35 to 40%. The left ventricle has moderately decreased function. The left ventricle demonstrates global hypokinesis. The left ventricular internal cavity size was normal in size.  There is mild concentric left ventricular hypertrophy. Left ventricular diastolic function could not be evaluated due to atrial fibrillation. Left ventricular diastolic function could not be evaluated. Right Ventricle: The right ventricular size is mildly enlarged. Right vetricular wall thickness was not well visualized. Right ventricular systolic function is low normal. There is normal pulmonary artery systolic pressure. The tricuspid regurgitant velocity is 1.93 m/s, and with an assumed right atrial pressure of 8 mmHg, the estimated right ventricular systolic pressure is [id number] mmHg. Left Atrium: Left atrial size was mildly dilated. Right Atrium: Right atrial size was severely dilated. Pericardium: There is no evidence of pericardial effusion. Mitral Valve: The mitral valve is normal in structure. Trivial mitral valve regurgitation. Tricuspid Valve: The tricuspid valve is abnormal. Tricuspid valve regurgitation is mild to moderate. Aortic Valve: The aortic valve is tricuspid. Aortic valve regurgitation is not visualized. Pulmonic Valve: The pulmonic valve was normal in structure. Pulmonic valve regurgitation is trivial. Aorta: Aortic dilatation noted. There is borderline dilatation of the aortic root, measuring 38 mm. Venous: The inferior vena cava is dilated in size with greater than 50% respiratory variability, suggesting right atrial pressure of 8 mmHg. IAS/Shunts: No atrial level shunt detected by color flow Doppler.  LEFT  VENTRICLE PLAX 2D LVIDd:         4.20 cm LVIDs:         3.00 cm LV PW:         1.20 cm LV IVS:        1.20 cm  RIGHT VENTRICLE RV S prime:     12.00 cm/s TAPSE (M-mode): 1.6 cm LEFT ATRIUM             Index        RIGHT ATRIUM           Index LA diam:        3.45 cm 1.31 cm/m   RA Area:     30.90 cm LA Vol (A2C):   84.8 ml 32.14 ml/m  RA Volume:   116.00 ml 43.96 ml/m LA Vol (A4C):   57.5 ml 21.79 ml/m LA Biplane Vol: 69.6 ml 26.38 ml/m  AORTIC VALVE LVOT Vmax:   88.20 cm/s LVOT Vmean:  57.700 cm/s LVOT VTI:    0.133 m  AORTA Ao Root diam: 3.80 cm Ao Asc diam:  3.10 cm TRICUSPID VALVE TR Peak grad:   14.9 mmHg TR Vmax:        193.00 cm/s  SHUNTS Systemic VTI: 0.13 m Mihai Croitoru MD Electronically signed by Sanda Klein MD Signature Date/Time: 05/30/2022/4:24:33 PM    Final         Scheduled Meds:  [MAR Hold] apixaban  5 mg Oral BID   [MAR Hold] carvedilol  3.125 mg Oral BID WC   [MAR Hold] dapagliflozin propanediol  10 mg Oral Daily   [MAR Hold] furosemide  40 mg Intravenous BID   [MAR Hold] sacubitril-valsartan  1 tablet Oral BID   [MAR Hold] spironolactone  12.5 mg Oral Daily   Continuous Infusions:  sodium chloride       LOS: 3 days    Time spent: over 30 min    Fayrene Helper, MD [facility]   To contact the attending provider between 7A-7P or the covering provider during after hours 7P-7A, please log into the web site [email or web address] and access using universal [place] password for that web site. If you do not have the password, please call the hospital operator.  06/01/2022, 1:27 PM

## 2022-06-01 NOTE — Progress Notes (Signed)
  Echocardiogram Echocardiogram Transesophageal has been performed.  Bobbye Charleston 06/01/2022, 3:08 PM

## 2022-06-01 NOTE — H&P (View-Only) (Signed)
PROGRESS NOTE    Anthony Delgado  [id number] DOB: [birth year] DOA: 05/29/2022 PCP: Anthony Brine, FNP  Chief Complaint  Patient presents with   Leg Swelling    Brief Narrative:  46 year old male with history of significant of morbid obesity, HTN, ran out of his medication about 3 months ago due to PCP leaving and closing his practice and patient having difficulty establishing care, comes into the hospital with worsening bilateral lower extremity edema, dyspnea, chest tightness.  He was found to be in Anthony Delgado-fib with RVR, significantly fluid overloaded and was admitted to the hospital.  He was placed on diltiazem infusion, Eliquis and cardiology was consulted. He tells me his weight is usually in the 330s, it was as high as 350s when he saw his PCP for new visit.  Assessment & Plan:   Principal Problem:   New onset atrial fibrillation (HCC)   Assessment and Plan: Paroxysmal Atrial Fibrillation with RVR TSH wnl, echo with decreased EF as noted below, mildly dilated LA Cardiology planning on TEE cardioversion 10/5 (today) Coreg for rate control eliquis Will continue to monitor and adjust prn   Heart failure with reduced ejection fraction, exacerbation  History of Diastolic HF Echo with EF [id number], moderately decreased function, global hypokinesis, RVSF low normal (see report)  Volume overloaded on exam  Strict I/O, daily weights Appreciate cardiology recs -> entresto, coreg, spironolactone, farxiga -> repeat echo 3-6 months, needs ischemic evaluation if LV remains decreased   Acute hypoxic respiratory failure-due to #1&2, currently on RA   Essential hypertension continue meds as above   Obesity, morbid-BMI 49.8.  He would benefit from weight loss.     DVT prophylaxis: eliquis Code Status: full Family Communication: none Disposition:   Status is: Inpatient Remains inpatient appropriate because: continued need for diuresis, cardiology procedures, etc   Consultants:   cardiology  Procedures:  Echo IMPRESSIONS     1. Left ventricular ejection fraction, by estimation, is 35 to 40%. The  left ventricle has moderately decreased function. The left ventricle  demonstrates global hypokinesis. There is mild concentric left ventricular  hypertrophy. Left ventricular  diastolic function could not be evaluated.   2. Right ventricular systolic function is low normal. The right  ventricular size is mildly enlarged. There is normal pulmonary artery  systolic pressure. The estimated right ventricular systolic pressure is  [id number] mmHg.   3. Left atrial size was mildly dilated.   4. Right atrial size was severely dilated.   5. The mitral valve is normal in structure. Trivial mitral valve  regurgitation.   6. The tricuspid valve is abnormal. Tricuspid valve regurgitation is mild  to moderate.   7. The aortic valve is tricuspid. Aortic valve regurgitation is not  visualized.   8. Aortic dilatation noted. There is borderline dilatation of the aortic  root, measuring 38 mm.   9. The inferior vena cava is dilated in size with >50% respiratory  variability, suggesting right atrial pressure of 8 mmHg.   Antimicrobials:  Anti-infectives (From admission, onward)    None       Subjective: No new complaints  Objective: Vitals:   05/31/22 2035 06/01/22 0534 06/01/22 0855 06/01/22 1325  BP: (!) 114/95 (!) 129/93 (!) 125/99 (!) 156/105  Pulse: 92 95 100 99  Resp: 18 18  20   Temp: 98.2 F (36.8 C) 98.1 F (36.7 C)  (!) 96.4 F (35.8 C)  TempSrc: Oral Oral  Tympanic  SpO2: 94% 96%  95%  Weight:  (!) 154 kg  Marland Kitchen)  153.8 kg  Height:    5\' 10"  (1.778 m)    Intake/Output Summary (Last 24 hours) at 06/01/2022 1327 Last data filed at 05/31/2022 2040 Gross per 24 hour  Intake 394.32 ml  Output 1240 ml  Net -845.68 ml   Filed Weights   05/31/22 0642 06/01/22 0534 06/01/22 1325  Weight: (!) 156.7 kg (!) 154 kg (!) 153.8 kg    Examination:  General: No  acute distress. Sitting up in chair. Cardiovascular: irregularly irregular Lungs: unlabored Abdomen: Soft, nontender, nondistended Neurological: Alert and oriented 3. Moves all extremities 4 . Cranial nerves II through XII grossly intact. Extremities: bilateral LE edema present, gradualyl improving  Data Reviewed: I have personally reviewed following labs and imaging studies  CBC: Recent Labs  Lab 05/29/22 1450 05/29/22 2313 06/01/22 0217  WBC 6.1 6.3 6.2  NEUTROABS 2.9  --  2.6  HGB 15.1 16.1 17.0  HCT 45.3 46.5 48.9  MCV 91.9 88.2 89.1  PLT 306 323 [id number]    Basic Metabolic Panel: Recent Labs  Lab 05/29/22 1450 05/29/22 2313 05/31/22 0224 06/01/22 0217  NA 137 138 139 138  K 4.0 3.3* 3.4* 4.2  CL 105 101 98 100  CO2 24 26 28 24   GLUCOSE 101* 132* 90 99  BUN 11 10 16 16   CREATININE 1.32* 1.26* 1.32* 1.36*  CALCIUM 8.8* 8.9 9.1 9.1  MG  --  2.0 1.9 2.4  PHOS  --  3.5  --  5.3*    GFR: Estimated Creatinine Clearance: 98.9 mL/min (Anthony Delgado) (by C-G formula based on SCr of 1.36 mg/dL (H)).  Liver Function Tests: Recent Labs  Lab 05/29/22 1450 06/01/22 0217  AST 32 35  ALT 27 24  ALKPHOS 62 67  BILITOT 0.9 0.8  PROT 7.3 8.3*  ALBUMIN 3.3* 3.4*    CBG: No results for input(s): "GLUCAP" in the last 168 hours.   Recent Results (from the past 240 hour(s))  Resp Panel by RT-PCR (Flu Anthony Delgado&B, Covid) Anterior Nasal Swab     Status: None   Collection Time: 05/29/22  6:57 PM   Specimen: Anterior Nasal Swab  Result Value Ref Range Status   SARS Coronavirus 2 by RT PCR NEGATIVE NEGATIVE Final    Comment: (NOTE) SARS-CoV-2 target nucleic acids are NOT DETECTED.  The SARS-CoV-2 RNA is generally detectable in upper respiratory specimens during the acute phase of infection. The lowest concentration of SARS-CoV-2 viral copies this assay can detect is 138 copies/mL. Anthony Delgado negative result does not preclude SARS-Cov-2 infection and should not be used as the sole basis for  treatment or other patient management decisions. Anthony Delgado negative result may occur with  improper specimen collection/handling, submission of specimen other than nasopharyngeal swab, presence of viral mutation(s) within the areas targeted by this assay, and inadequate number of viral copies(<138 copies/mL). Marques Ericson negative result must be combined with clinical observations, patient history, and epidemiological information. The expected result is Negative.  Fact Sheet for Patients:  EntrepreneurPulse.com.au  Fact Sheet for Healthcare Providers:  IncredibleEmployment.be  This test is no t yet approved or cleared by the [place] FDA and  has been authorized for detection and/or diagnosis of SARS-CoV-2 by FDA under an Emergency Use Authorization (EUA). This EUA will remain  in effect (meaning this test can be used) for the duration of the COVID-19 declaration under Section 564(b)(1) of the Act, 21 U.S.C.section 360bbb-3(b)(1), unless the authorization is terminated  or revoked sooner.       Influenza Jacque Byron by PCR  NEGATIVE NEGATIVE Final   Influenza B by PCR NEGATIVE NEGATIVE Final    Comment: (NOTE) The Xpert Xpress SARS-CoV-2/FLU/RSV plus assay is intended as an aid in the diagnosis of influenza from Nasopharyngeal swab specimens and should not be used as Elissia Spiewak sole basis for treatment. Nasal washings and aspirates are unacceptable for Xpert Xpress SARS-CoV-2/FLU/RSV testing.  Fact Sheet for Patients: EntrepreneurPulse.com.au  Fact Sheet for Healthcare Providers: IncredibleEmployment.be  This test is not yet approved or cleared by the [place] FDA and has been authorized for detection and/or diagnosis of SARS-CoV-2 by FDA under an Emergency Use Authorization (EUA). This EUA will remain in effect (meaning this test can be used) for the duration of the COVID-19 declaration under Section 564(b)(1) of the Act, 21  U.S.C. section 360bbb-3(b)(1), unless the authorization is terminated or revoked.  Performed at [facility], [place] [address], [place], [place] [address]          Radiology Studies: ECHOCARDIOGRAM COMPLETE  Result Date: 05/30/2022    ECHOCARDIOGRAM REPORT   Patient Name:   Anthony Delgado Date of Exam: 05/30/2022 Medical Rec #:  [id number]      Height:       70.0 in Accession #:    [id number]     Weight:       347.2 lb Date of Birth:  1976-01-28       BSA:          2.639 m Patient Age:    43 years       BP:           128/88 mmHg Patient Gender: M              HR:           94 bpm. Exam Location:  Inpatient Procedure: 2D Echo, Cardiac Doppler and Color Doppler Indications:    G38.75 Acute diastolic (congestive) heart failure  History:        Patient has no prior history of Echocardiogram examinations.                 TIA, Arrythmias:Atrial Fibrillation; Signs/Symptoms:Chest Pain.  Sonographer:    Greer Pickerel Sonographer#2:  Raquel Sarna Senior Referring Phys: Kayleen Memos  Sonographer Comments: Technically difficult study due to poor echo windows and patient is obese. IMPRESSIONS  1. Left ventricular ejection fraction, by estimation, is 35 to 40%. The left ventricle has moderately decreased function. The left ventricle demonstrates global hypokinesis. There is mild concentric left ventricular hypertrophy. Left ventricular diastolic function could not be evaluated.  2. Right ventricular systolic function is low normal. The right ventricular size is mildly enlarged. There is normal pulmonary artery systolic pressure. The estimated right ventricular systolic pressure is 64.3 mmHg.  3. Left atrial size was mildly dilated.  4. Right atrial size was severely dilated.  5. The mitral valve is normal in structure. Trivial mitral valve regurgitation.  6. The tricuspid valve is abnormal. Tricuspid valve regurgitation is mild to moderate.  7. The aortic valve is tricuspid. Aortic valve regurgitation is not  visualized.  8. Aortic dilatation noted. There is borderline dilatation of the aortic root, measuring 38 mm.  9. The inferior vena cava is dilated in size with >50% respiratory variability, suggesting right atrial pressure of 8 mmHg. FINDINGS  Left Ventricle: Left ventricular ejection fraction, by estimation, is 35 to 40%. The left ventricle has moderately decreased function. The left ventricle demonstrates global hypokinesis. The left ventricular internal cavity size was normal in size.  There is mild concentric left ventricular hypertrophy. Left ventricular diastolic function could not be evaluated due to atrial fibrillation. Left ventricular diastolic function could not be evaluated. Right Ventricle: The right ventricular size is mildly enlarged. Right vetricular wall thickness was not well visualized. Right ventricular systolic function is low normal. There is normal pulmonary artery systolic pressure. The tricuspid regurgitant velocity is 1.93 m/s, and with an assumed right atrial pressure of 8 mmHg, the estimated right ventricular systolic pressure is [id number] mmHg. Left Atrium: Left atrial size was mildly dilated. Right Atrium: Right atrial size was severely dilated. Pericardium: There is no evidence of pericardial effusion. Mitral Valve: The mitral valve is normal in structure. Trivial mitral valve regurgitation. Tricuspid Valve: The tricuspid valve is abnormal. Tricuspid valve regurgitation is mild to moderate. Aortic Valve: The aortic valve is tricuspid. Aortic valve regurgitation is not visualized. Pulmonic Valve: The pulmonic valve was normal in structure. Pulmonic valve regurgitation is trivial. Aorta: Aortic dilatation noted. There is borderline dilatation of the aortic root, measuring 38 mm. Venous: The inferior vena cava is dilated in size with greater than 50% respiratory variability, suggesting right atrial pressure of 8 mmHg. IAS/Shunts: No atrial level shunt detected by color flow Doppler.  LEFT  VENTRICLE PLAX 2D LVIDd:         4.20 cm LVIDs:         3.00 cm LV PW:         1.20 cm LV IVS:        1.20 cm  RIGHT VENTRICLE RV S prime:     12.00 cm/s TAPSE (M-mode): 1.6 cm LEFT ATRIUM             Index        RIGHT ATRIUM           Index LA diam:        3.45 cm 1.31 cm/m   RA Area:     30.90 cm LA Vol (A2C):   84.8 ml 32.14 ml/m  RA Volume:   116.00 ml 43.96 ml/m LA Vol (A4C):   57.5 ml 21.79 ml/m LA Biplane Vol: 69.6 ml 26.38 ml/m  AORTIC VALVE LVOT Vmax:   88.20 cm/s LVOT Vmean:  57.700 cm/s LVOT VTI:    0.133 m  AORTA Ao Root diam: 3.80 cm Ao Asc diam:  3.10 cm TRICUSPID VALVE TR Peak grad:   14.9 mmHg TR Vmax:        193.00 cm/s  SHUNTS Systemic VTI: 0.13 m Mihai Croitoru MD Electronically signed by Sanda Klein MD Signature Date/Time: 05/30/2022/4:24:33 PM    Final         Scheduled Meds:  [MAR Hold] apixaban  5 mg Oral BID   [MAR Hold] carvedilol  3.125 mg Oral BID WC   [MAR Hold] dapagliflozin propanediol  10 mg Oral Daily   [MAR Hold] furosemide  40 mg Intravenous BID   [MAR Hold] sacubitril-valsartan  1 tablet Oral BID   [MAR Hold] spironolactone  12.5 mg Oral Daily   Continuous Infusions:  sodium chloride       LOS: 3 days    Time spent: over 30 min    Fayrene Helper, MD [facility]   To contact the attending provider between 7A-7P or the covering provider during after hours 7P-7A, please log into the web site [email or web address] and access using universal [place] password for that web site. If you do not have the password, please call the hospital operator.  06/01/2022, 1:27 PM

## 2022-06-01 NOTE — Progress Notes (Signed)
Pt stated he could place self on cpap when ready for bed. RT made pt aware of help is needed to call.

## 2022-06-01 NOTE — Progress Notes (Signed)
Heart Failure Nurse Navigator Progress Note  PCP: Minette Brine, FNP PCP-Cardiologist: None Admission Diagnosis: None Admitted from: Home  Presentation:   Anthony Delgado presented with bilateral leg edema, shortness of breath, with new onset orthopnea, chest tightness, patient has not taking his medication in a couple weeks, because "he just didn't get them filled". BNP 234, BMI 51.24, BP 184/138 HR 109, EKG showed Atrial fibrillation rate 120's - 150's(new), IV lasix 40 mg given. Started on diltiazem drip, CXR with stable cardiomegaly, mild congestion, left basilar atelectasis. Patient scheduled for a TEE/DCCV on 10/5.  Patient was educated on the sign and symptoms of heart failure, importance of daily weights, when to call his doctor or go to the ED. Diet/ fluid restrictions, patient states his niece is in Health and safety inspector school and can help with food substitutes, he doesn't use salt, like to use Mrs. Dash, and he doesn't drink soda, only water and juice. Patient was educated on the importance of taking all medication as prescribed and attending all medical appointments. He is scheduled for a HF TOC appointment on 06/12/2022 @ 9 am. ,   ECHO/ LVEF: 35-40 %   Clinical Course:  Past Medical History:  Diagnosis Date   Hypertension      Social History   Socioeconomic History   Marital status: Married    Spouse name: Izora Gala   Number of children: 4   Years of education: Not on file   Highest education level: High school graduate  Occupational History   Occupation: Sports coach  Tobacco Use   Smoking status: Never   Smokeless tobacco: Never  Vaping Use   Vaping Use: Never used  Substance and Sexual Activity   Alcohol use: No   Drug use: No   Sexual activity: Yes    Partners: Female  Other Topics Concern   Not on file  Social History Narrative   Not on file   Social Determinants of Health   Financial Resource Strain: Low Risk  (06/01/2022)   Overall Financial Resource Strain (CARDIA)     Difficulty of Paying Living Expenses: Not hard at all  Food Insecurity: No Food Insecurity (06/01/2022)   Hunger Vital Sign    Worried About Running Out of Food in the Last Year: Never true    [place] in the Last Year: Never true  Transportation Needs: No Transportation Needs (06/01/2022)   PRAPARE - Hydrologist (Medical): No    Lack of Transportation (Non-Medical): No  Physical Activity: Not on file  Stress: Not on file  Social Connections: Not on file   Education Assessment and Provision:  Detailed education and instructions provided on heart failure disease management including the following:  Signs and symptoms of Heart Failure When to call the physician Importance of daily weights Low sodium diet Fluid restriction Medication management Anticipated future follow-up appointments  Patient education given on each of the above topics.  Patient acknowledges understanding via teach back method and acceptance of all instructions.  Education Materials:  "Living Better With Heart Failure" Booklet, HF zone tool, & Daily Weight Tracker Tool.  Patient has scale at home: Yes Patient has pill box at home: NA    High Risk Criteria for Readmission and/or Poor Patient Outcomes: Heart failure hospital admissions (last 6 months): 0  No Show rate: 6 % Difficult social situation: No Demonstrates medication adherence: No Primary Language: English Literacy level: Reading, writing, and comprehension.  Barriers of Care:   Diet/ fluid restrictions Daily  weights Continued education ( New HF Dx.)  Considerations/Referrals:   Referral made to Heart Failure Pharmacist Stewardship:Yes Referral made to Heart Failure CSW/NCM TOC: No Referral made to Heart & Vascular TOC clinic: Yes, 06/12/2022 @ 9 am.  Items for Follow-up on DC/TOC: Diet/ fluid restrictions Daily weights Continued Education ( New HF Dx)   Earnestine Leys, BSN, RN Heart Failure Nurse  Navigator Secure Chat Only

## 2022-06-01 NOTE — Anesthesia Procedure Notes (Signed)
Procedure Name: MAC Date/Time: 06/01/2022 2:29 PM  Performed by: Lorie Phenix, CRNAPre-anesthesia Checklist: Patient identified, Emergency Drugs available, Suction available, Patient being monitored and Timeout performed Patient Re-evaluated:Patient Re-evaluated prior to induction Oxygen Delivery Method: Nasal cannula Placement Confirmation: positive ETCO2

## 2022-06-01 NOTE — Progress Notes (Signed)
Mobility Specialist - Progress Note   06/01/22 1551  Mobility  Activity Off unit   Pt was off unit. Will follow up if time permits.  Larey Seat

## 2022-06-01 NOTE — Anesthesia Postprocedure Evaluation (Signed)
Anesthesia Post Note  Patient: Anthony Delgado  Procedure(s) Performed: TRANSESOPHAGEAL ECHOCARDIOGRAM (TEE) CARDIOVERSION     Patient location during evaluation: Endoscopy Anesthesia Type: General Level of consciousness: awake and alert Pain management: pain level controlled Vital Signs Assessment: post-procedure vital signs reviewed and stable Respiratory status: spontaneous breathing, nonlabored ventilation and respiratory function stable Cardiovascular status: blood pressure returned to baseline and stable Postop Assessment: no apparent nausea or vomiting Anesthetic complications: no   No notable events documented.  Last Vitals:  Vitals:   06/01/22 1610 06/01/22 1701  BP: 97/78 (!) 132/94  Pulse: 83 96  Resp: (!) 23 16  Temp:  (!) 36.4 C  SpO2: 93% 96%    Last Pain:  Vitals:   06/01/22 1701  TempSrc: Oral  PainSc:                  Emmi Wertheim,W. EDMOND

## 2022-06-02 ENCOUNTER — Other Ambulatory Visit (HOSPITAL_COMMUNITY): Payer: Self-pay

## 2022-06-02 ENCOUNTER — Inpatient Hospital Stay (HOSPITAL_COMMUNITY): Payer: Commercial Managed Care - PPO

## 2022-06-02 ENCOUNTER — Inpatient Hospital Stay (HOSPITAL_BASED_OUTPATIENT_CLINIC_OR_DEPARTMENT_OTHER): Payer: Commercial Managed Care - PPO

## 2022-06-02 DIAGNOSIS — R7989 Other specified abnormal findings of blood chemistry: Secondary | ICD-10-CM

## 2022-06-02 LAB — CBC WITH DIFFERENTIAL/PLATELET
Abs Immature Granulocytes: 0.02 10*3/uL (ref 0.00–0.07)
Basophils Absolute: 0.1 10*3/uL (ref 0.0–0.1)
Basophils Relative: 1 %
Eosinophils Absolute: 0.3 10*3/uL (ref 0.0–0.5)
Eosinophils Relative: 5 %
HCT: 49.5 % (ref 39.0–52.0)
Hemoglobin: 16.3 g/dL (ref 13.0–17.0)
Immature Granulocytes: 0 %
Lymphocytes Relative: 46 %
Lymphs Abs: 2.6 10*3/uL (ref 0.7–4.0)
MCH: 29.9 pg (ref 26.0–34.0)
MCHC: 32.9 g/dL (ref 30.0–36.0)
MCV: 90.7 fL (ref 80.0–100.0)
Monocytes Absolute: 0.6 10*3/uL (ref 0.1–1.0)
Monocytes Relative: 10 %
Neutro Abs: 2.2 10*3/uL (ref 1.7–7.7)
Neutrophils Relative %: 38 %
Platelets: 317 10*3/uL (ref 150–400)
RBC: 5.46 MIL/uL (ref 4.22–5.81)
RDW: 13.3 % (ref 11.5–15.5)
WBC: 5.6 10*3/uL (ref 4.0–10.5)
nRBC: 0 % (ref 0.0–0.2)

## 2022-06-02 LAB — BASIC METABOLIC PANEL
Anion gap: 13 (ref 5–15)
BUN: 15 mg/dL (ref 6–20)
CO2: 26 mmol/L (ref 22–32)
Calcium: 8.9 mg/dL (ref 8.9–10.3)
Chloride: 99 mmol/L (ref 98–111)
Creatinine, Ser: 1.41 mg/dL — ABNORMAL HIGH (ref 0.61–1.24)
GFR, Estimated: >60 mL/min (ref >60)
Glucose, Bld: 98 mg/dL (ref 70–99)
Potassium: 3.7 mmol/L (ref 3.5–5.1)
Sodium: 138 mmol/L (ref 135–145)

## 2022-06-02 LAB — PHOSPHORUS: Phosphorus: 5 mg/dL — ABNORMAL HIGH (ref 2.5–4.6)

## 2022-06-02 LAB — BRAIN NATRIURETIC PEPTIDE: B Natriuretic Peptide: 97.6 pg/mL (ref 0.0–100.0)

## 2022-06-02 LAB — D-DIMER, QUANTITATIVE: D-Dimer, Quant: 0.85 ug/mL-FEU — ABNORMAL HIGH (ref 0.00–0.50)

## 2022-06-02 LAB — MAGNESIUM: Magnesium: 2.2 mg/dL (ref 1.7–2.4)

## 2022-06-02 MED ORDER — SPIRONOLACTONE 25 MG PO TABS
12.5000 mg | ORAL_TABLET | Freq: Every day | ORAL | 1 refills | Status: DC
  Filled 2022-06-02: qty 15, 30d supply, fill #0
  Filled 2022-06-19 (×2): qty 15, 30d supply, fill #1

## 2022-06-02 MED ORDER — FUROSEMIDE 40 MG PO TABS
40.0000 mg | ORAL_TABLET | Freq: Two times a day (BID) | ORAL | 1 refills | Status: DC
  Filled 2022-06-02: qty 60, 30d supply, fill #0
  Filled 2022-06-28: qty 60, 30d supply, fill #1

## 2022-06-02 MED ORDER — SACUBITRIL-VALSARTAN 49-51 MG PO TABS
1.0000 | ORAL_TABLET | Freq: Two times a day (BID) | ORAL | Status: DC
  Administered 2022-06-02 – 2022-06-03 (×3): 1 via ORAL
  Filled 2022-06-02 (×4): qty 1

## 2022-06-02 MED ORDER — FUROSEMIDE 40 MG PO TABS
40.0000 mg | ORAL_TABLET | Freq: Two times a day (BID) | ORAL | Status: DC
  Administered 2022-06-02 – 2022-06-03 (×3): 40 mg via ORAL
  Filled 2022-06-02 (×3): qty 1

## 2022-06-02 MED ORDER — SACUBITRIL-VALSARTAN 49-51 MG PO TABS
1.0000 | ORAL_TABLET | Freq: Two times a day (BID) | ORAL | 1 refills | Status: AC
  Filled 2022-06-02: qty 60, 30d supply, fill #0
  Filled 2022-06-28: qty 60, 30d supply, fill #1

## 2022-06-02 MED ORDER — CARVEDILOL 6.25 MG PO TABS
6.2500 mg | ORAL_TABLET | Freq: Two times a day (BID) | ORAL | Status: DC
  Administered 2022-06-02: 6.25 mg via ORAL
  Filled 2022-06-02: qty 1

## 2022-06-02 MED ORDER — CARVEDILOL 6.25 MG PO TABS
6.2500 mg | ORAL_TABLET | Freq: Two times a day (BID) | ORAL | Status: DC
  Administered 2022-06-02 – 2022-06-03 (×2): 6.25 mg via ORAL
  Filled 2022-06-02 (×2): qty 1

## 2022-06-02 MED ORDER — APIXABAN 5 MG PO TABS
5.0000 mg | ORAL_TABLET | Freq: Two times a day (BID) | ORAL | 1 refills | Status: DC
  Filled 2022-06-02: qty 60, 30d supply, fill #0
  Filled 2022-06-28: qty 60, 30d supply, fill #1

## 2022-06-02 MED ORDER — DAPAGLIFLOZIN PROPANEDIOL 10 MG PO TABS
10.0000 mg | ORAL_TABLET | Freq: Every day | ORAL | 1 refills | Status: AC
  Filled 2022-06-02: qty 30, 30d supply, fill #0
  Filled 2022-06-19 – 2022-06-26 (×3): qty 30, 30d supply, fill #1

## 2022-06-02 MED ORDER — IOHEXOL 350 MG/ML SOLN
80.0000 mL | Freq: Once | INTRAVENOUS | Status: AC | PRN
  Administered 2022-06-02: 80 mL via INTRAVENOUS

## 2022-06-02 MED ORDER — CARVEDILOL 6.25 MG PO TABS: 6.2500 mg | ORAL_TABLET | Freq: Once | ORAL | Status: DC

## 2022-06-02 MED ORDER — CARVEDILOL 6.25 MG PO TABS
6.2500 mg | ORAL_TABLET | Freq: Two times a day (BID) | ORAL | 1 refills | Status: DC
  Filled 2022-06-02: qty 60, 30d supply, fill #0

## 2022-06-02 MED ORDER — POTASSIUM CHLORIDE CRYS ER 20 MEQ PO TBCR
20.0000 meq | EXTENDED_RELEASE_TABLET | Freq: Once | ORAL | Status: AC
  Administered 2022-06-02: 20 meq via ORAL
  Filled 2022-06-02: qty 1

## 2022-06-02 MED ORDER — CARVEDILOL 12.5 MG PO TABS: 12.5000 mg | ORAL_TABLET | Freq: Two times a day (BID) | ORAL | Status: DC

## 2022-06-02 NOTE — Progress Notes (Signed)
Mobility Specialist - Progress Note   06/02/22 1430  Mobility  Activity Contraindicated/medical hold   Medical hold. Will follow up.  Larey Seat

## 2022-06-02 NOTE — Progress Notes (Signed)
Heart Failure Stewardship Pharmacist Progress Note   PCP: Minette Brine, FNP PCP-Cardiologist: Kirk Ruths, MD    HPI:  46 yo M with PMH of HTN, obesity, and medication noncompliance.   Presented to the ED on 10/2 with shortness of breath, LE edema, orthopnea, and chest tightness. Found to be in afib RVR. CXR with stable cardiomegaly, mild congestion, L basilar atelectasis or scarring, R lung clear. BNP 234 but may be falsely low due to obesity. ECHO on 10/3 showed LVEF 35-40% with mild LVH, RV low normal, trivial MR, mild-moderate TR, LA mildly dilated, RA severely dilated, RA pressure of 8 mmHg. S/p unsuccessful TEE/DCCV on 10/5. EF 30% on TEE.  Current HF Medications: Diuretic: furosemide 40 mg PO BID Beta Blocker: carvedilol 6.25 mg BID ACE/ARB/ARNI: Entresto 49/51 mg BID MRA: spironolactone 12.5 mg daily SGLT2i: Farxiga 10 mg daily  Prior to admission HF Medications: None  Pertinent Lab Values: Serum creatinine 1.41, BUN 15, Potassium 3.7, Sodium 138, BNP 234.6, Magnesium 2.2  Vital Signs: Weight: 338 lbs (admission weight: 347 lbs) Blood pressure: 120/90s  Heart rate: 90s - afib I/O: -2.5L yesterday; net -5.2L  Medication Assistance / Insurance Benefits Check: Does the patient have prescription insurance?  Yes Type of insurance plan: UHC/[employer] Pharmacist, community   Outpatient Pharmacy:  Prior to admission outpatient pharmacy: [facility] Is the patient willing to use [facility] at discharge? Yes Is the patient willing to transition their outpatient pharmacy to utilize a [facility] outpatient pharmacy?   Pending    Assessment: 1. Acute systolic CHF (LVEF 24-82%), due to presumed NICM, afib RVR, med noncompliance. TEE/DCCV 10/5. Ischemic eval in 3-6 months if EF still down after GDMT optimized. NYHA class III symptoms. - Agree with transitioning to furosemide 40 mg PO BID. Strict I/Os and daily weights. Keep K>4 and Mag>2.  - Continue carvedilol 6.25 mg BID -  Agree with increasing to Entresto 49/51 mg BID - Continue spironolactone 12.5 mg daily, consider increasing to target 25 mg daily at follow up - Continue Farxiga 10 mg daily   Plan: 1) Medication changes recommended at this time: - Agree with changes; discharge this weekend  2) Patient assistance: - Farxiga copay $30 - copay card lowers to $0 per month - Eliquis copay $30 - copay card lowers to $10 per month - Entresto prior authorization approved, copay $30 - copay card lowers to $10 per fill - Will ask RNCM to bring monthly copay cards to the room - Will see if discharge meds can be sent to [facility] for anticipated weekend discharge  3)  Education  - Patient has been educated on current HF medications and potential additions to HF medication regimen - Patient verbalizes understanding that over the next few months, these medication doses may change and more medications may be added to optimize HF regimen - Patient has been educated on basic disease state pathophysiology and goals of therapy   Kerby Nora, PharmD, BCPS Heart Failure Stewardship Pharmacist Phone [phone]

## 2022-06-02 NOTE — Progress Notes (Signed)
Notified Dr. Florene Glen of positive d dimer.

## 2022-06-02 NOTE — Progress Notes (Signed)
Patient had 2.8 second pause. Asymptomatic. I notified Dr. Florene Glen, orders received.

## 2022-06-02 NOTE — Progress Notes (Signed)
Pt stated that he will place CPAP on when ready for bed. RT will monitor.

## 2022-06-02 NOTE — Progress Notes (Signed)
Lower extremity venous bilateral study completed.   Please see CV Proc for preliminary results.   Zanaya Baize, RDMS, RVT  

## 2022-06-02 NOTE — Progress Notes (Signed)
Rounding Note    Patient Name: Anthony Delgado Date of Encounter: 06/02/2022  [place] Cardiologist: New  Subjective   Pt denies CP or dyspnea  Inpatient Medications    Scheduled Meds:  apixaban  5 mg Oral BID   carvedilol  3.125 mg Oral BID WC   dapagliflozin propanediol  10 mg Oral Daily   furosemide  40 mg Intravenous BID   sacubitril-valsartan  1 tablet Oral BID   spironolactone  12.5 mg Oral Daily   Continuous Infusions:    PRN Meds: acetaminophen, mouth rinse, polyethylene glycol   Vital Signs    Vitals:   06/01/22 1701 06/01/22 1825 06/01/22 2035 06/02/22 0551  BP: (!) 132/94 (!) 127/100 (!) 144/116 (!) 127/103  Pulse: 96 98 (!) 36 83  Resp: 16     Temp: (!) 97.5 F (36.4 C)  98.5 F (36.9 C) 97.7 F (36.5 C)  TempSrc: Oral  Oral Oral  SpO2: 96%  95% 96%  Weight:    (!) 153.5 kg  Height:        Intake/Output Summary (Last 24 hours) at 06/02/2022 0827 Last data filed at 06/02/2022 0550 Gross per 24 hour  Intake 920 ml  Output 1275 ml  Net -355 ml       06/02/2022    5:51 AM 06/01/2022    1:25 PM 06/01/2022    5:34 AM  Last 3 Weights  Weight (lbs) 338 lb 8 oz 339 lb 339 lb 6.4 oz  Weight (kg) 153.543 kg 153.769 kg 153.951 kg      Telemetry    Atrial fibrillation rate controlled- Personally Reviewed   Physical Exam   GEN: WD, NAD, obese Neck: supple, no JVD Cardiac: irregular, no rub Respiratory: CTA; no rhonchi GI: Soft, NT/ND MS: 1+ edema Neuro:  Grossly intact Psych: Normal affect   Labs    High Sensitivity Troponin:   Recent Labs  Lab 05/29/22 1450 05/29/22 1845 05/29/22 2053 05/29/22 2313  TROPONINIHS 169* 183* 155* 183*      Chemistry Recent Labs  Lab 05/29/22 1450 05/29/22 2313 05/31/22 0224 06/01/22 0217 06/02/22 0202  NA 137   < > 139 138 138  K 4.0   < > 3.4* 4.2 3.7  CL 105   < > 98 100 99  CO2 24   < > 28 24 26   GLUCOSE 101*   < > 90 99 98  BUN 11   < > 16 16 15   CREATININE 1.32*   < >  1.32* 1.36* 1.41*  CALCIUM 8.8*   < > 9.1 9.1 8.9  MG  --    < > 1.9 2.4 2.2  PROT 7.3  --   --  8.3*  --   ALBUMIN 3.3*  --   --  3.4*  --   AST 32  --   --  35  --   ALT 27  --   --  24  --   ALKPHOS 62  --   --  67  --   BILITOT 0.9  --   --  0.8  --   GFRNONAA >60   < > >60 >60 >60  ANIONGAP 8   < > 13 14 13    < > = values in this interval not displayed.      Hematology Recent Labs  Lab 05/29/22 2313 06/01/22 0217 06/02/22 0202  WBC 6.3 6.2 5.6  RBC 5.27 5.49 5.46  HGB 16.1 17.0 16.3  HCT  46.5 48.9 49.5  MCV 88.2 89.1 90.7  MCH 30.6 31.0 29.9  MCHC 34.6 34.8 32.9  RDW 13.3 13.3 13.3  PLT 323 326 317    Thyroid  Recent Labs  Lab 05/29/22 2053  TSH 3.826     BNP Recent Labs  Lab 05/29/22 1450  BNP 234.6*      Radiology    ECHO TEE  Result Date: 06/01/2022    TRANSESOPHOGEAL ECHO REPORT   Patient Name:   Alani Carrejo Date of Exam: 06/01/2022 Medical Rec #:  [id number]      Height:       70.0 in Accession #:    [id number]     Weight:       339.0 lb Date of Birth:  [birth year]       BSA:          2.612 m Patient Age:    46 years       BP:           131/116 mmHg Patient Gender: M              HR:           113 bpm. Exam Location:  Inpatient Procedure: 3D Echo, Transesophageal Echo, Cardiac Doppler and Color Doppler Indications:     I48.91* Unspeicified atrial fibrillation  History:         Patient has prior history of Echocardiogram examinations.                  Abnormal ECG, TIA, Arrythmias:Atrial Fibrillation;                  Signs/Symptoms:Chest Pain.  Sonographer:     Greer Pickerel Sonographer#2:   Roseanna Rainbow RDCS Referring Phys:  Lelon Perla Diagnosing Phys: Candee Furbish MD PROCEDURE: After discussion of the risks and benefits of a TEE, an informed consent was obtained from the patient. The transesophogeal probe was passed without difficulty through the esophogus of the patient. Imaged were obtained with the patient in a left lateral decubitus position.  Sedation performed by different physician. The patient was monitored while under deep sedation. Anesthestetic sedation was provided intravenously by Anesthesiology: 313mg  of Propofol, 40mg  of Lidocaine. The patient's vital signs; including heart rate, blood pressure, and oxygen saturation; remained stable throughout the procedure. The patient developed no complications during the procedure. An unsuccessful direct current cardioversion was performed at 200 joules with  3 attempts. Only 2-3 beats of sinus beats post conversion noted before return to atrial fibrillation. IMPRESSIONS  1. Left ventricular ejection fraction, by estimation, is 30 to 35%. The left ventricle has moderate to severely decreased function. The left ventricle demonstrates global hypokinesis. The left ventricular internal cavity size was mildly dilated.  2. Right ventricular systolic function is mildly reduced. The right ventricular size is mildly enlarged. There is normal pulmonary artery systolic pressure.  3. Left atrial size was moderately dilated. No left atrial/left atrial appendage thrombus was detected.  4. Right atrial size was moderately dilated.  5. The mitral valve is normal in structure. Trivial mitral valve regurgitation.  6. Tricuspid valve regurgitation is severe.  7. The aortic valve is tricuspid. Aortic valve regurgitation is trivial.  8. Only 2-3 beats of sinus beats post conversion noted before return to atrial fibrillation. FINDINGS  Left Ventricle: Left ventricular ejection fraction, by estimation, is 30 to 35%. The left ventricle has moderate to severely decreased function. The left ventricle demonstrates global hypokinesis. The left ventricular internal  cavity size was mildly dilated. Right Ventricle: The right ventricular size is mildly enlarged. No increase in right ventricular wall thickness. Right ventricular systolic function is mildly reduced. There is normal pulmonary artery systolic pressure. The tricuspid  regurgitant velocity  is 2.04 m/s, and with an assumed right atrial pressure of 3 mmHg, the estimated right ventricular systolic pressure is 29.5 mmHg. Left Atrium: Left atrial size was moderately dilated. Spontaneous echo contrast was present. No left atrial/left atrial appendage thrombus was detected. Right Atrium: Right atrial size was moderately dilated. Pericardium: There is no evidence of pericardial effusion. Mitral Valve: The mitral valve is normal in structure. Trivial mitral valve regurgitation. Tricuspid Valve: The tricuspid valve is normal in structure. Tricuspid valve regurgitation is severe. Aortic Valve: The aortic valve is tricuspid. Aortic valve regurgitation is trivial. Pulmonic Valve: The pulmonic valve was normal in structure. Pulmonic valve regurgitation is trivial. Aorta: The aortic root is normal in size and structure. IAS/Shunts: No atrial level shunt detected by color flow Doppler.  TRICUSPID VALVE TR Peak grad:   16.6 mmHg TR Vmax:        204.00 cm/s Candee Furbish MD Electronically signed by Candee Furbish MD Signature Date/Time: 06/01/2022/4:26:26 PM    Final       Patient Profile     46 y.o. male with morbid obesity, hypertension, noncompliance being evaluated for atrial fibrillation.  Assessment & Plan    1 cardiomyopathy-patient is noted to have LV dysfunction.  Etiology not clear.  However he was significantly hypertensive and has not taken blood pressure medications at home.  He also has been in atrial fibrillation and rate possibly elevated at home and tachycardia could be contributing.  For now we will treat medically.  Continue carvedilol, Entresto (increase to 49/51 BID), spironolactone and [place].  Once medications fully titrated we will need to repeat echocardiogram to reassess LV function.  If it remains decreased despite medical therapy will need ischemia evaluation.  2 new onset atrial fibrillation-patient had TEE guided cardioversion yesterday.  However he held sinus  for only 3 beats.  We will continue carvedilol for rate control.  Continue apixaban.  We will arrange follow-up in atrial fibrillation clinic following discharge for consideration of different antiarrhythmic versus ablation.    3 acute combined systolic/diastolic congestive heart failure-patient remains somewhat volume overloaded.  Continue Lasix 40 mg twice daily, Farxiga and spironolactone.   4 hypertension-blood pressure remains elevated.  Increase Entresto to 49/51 twice daily and follow.  5 obesity/snoring-we will need outpatient sleep study as this could be contributing to atrial fibrillation.  6 minimally elevated troponin-no clear trend and not consistent with acute coronary syndrome.  No plans for further ischemia evaluation at this time.  Patient can be discharged from a cardiac standpoint.  Check potassium and renal function in 1 week.  We will arrange follow-up in CHF clinic for medication titration.  Follow-up atrial fibrillation clinic as outlined above.  Follow-up with me in 3 months.  For questions or updates, please contact [place] Please consult [email or web address] for contact info under        Signed, Kirk Ruths, MD  06/02/2022, 8:27 AM

## 2022-06-02 NOTE — Progress Notes (Signed)
PROGRESS NOTE    Anthony Delgado  [id number] DOB: [birth year] DOA: 05/29/2022 PCP: Minette Brine, FNP  Chief Complaint  Patient presents with   Leg Swelling    Brief Narrative:  46 year old male with history of significant of morbid obesity, HTN, ran out of his medication about 3 months ago due to PCP leaving and closing his practice and patient having difficulty establishing care, comes into the hospital with worsening bilateral lower extremity edema, dyspnea, chest tightness.  He was found to be in Kylyn Sookram-fib with RVR, significantly fluid overloaded and was admitted to the hospital.  He was placed on diltiazem infusion, Eliquis and cardiology was consulted. He tells me his weight is usually in the 330s, it was as high as 350s when he saw his PCP for new visit.  Assessment & Plan:   Principal Problem:   Atrial fibrillation with RVR (HCC)   Assessment and Plan: Shortness of breath with exertion Continued, greater than I would expect at this time after diuresis CXR today without active disease D dimer miminally elevated, negative LE [place] Follow CT PE protocol to r/o PE Continue diuresis as noted below  Paroxysmal Atrial Fibrillation with RVR TSH wnl, echo with decreased EF as noted below, mildly dilated LA S/p TEE cardioversion 10/5, but back in atrial fibrillation  Coreg for rate control Eliquis Appreciate cardiology assistance Will continue to monitor and adjust prn   Heart failure with reduced ejection fraction, exacerbation  History of Diastolic HF Echo with EF [id number], moderately decreased function, global hypokinesis, RVSF low normal (see report)  Volume overloaded on exam, continued LE edema, gradually improving Strict I/O, daily weights Appreciate cardiology recs -> entresto, coreg, spironolactone, farxiga -> repeat echo 3-6 months, needs ischemic evaluation if LV remains decreased   Acute hypoxic respiratory failure-due to #1&2, currently on RA   Essential hypertension  continue meds as above   Obesity, morbid-BMI 49.8.  He would benefit from weight loss.     DVT prophylaxis: eliquis Code Status: full Family Communication: none Disposition:   Status is: Inpatient Remains inpatient appropriate because: continued need for diuresis, cardiology procedures, etc   Consultants:  cardiology  Procedures:  Echo IMPRESSIONS     1. Left ventricular ejection fraction, by estimation, is 35 to 40%. The  left ventricle has moderately decreased function. The left ventricle  demonstrates global hypokinesis. There is mild concentric left ventricular  hypertrophy. Left ventricular  diastolic function could not be evaluated.   2. Right ventricular systolic function is low normal. The right  ventricular size is mildly enlarged. There is normal pulmonary artery  systolic pressure. The estimated right ventricular systolic pressure is  [id number] mmHg.   3. Left atrial size was mildly dilated.   4. Right atrial size was severely dilated.   5. The mitral valve is normal in structure. Trivial mitral valve  regurgitation.   6. The tricuspid valve is abnormal. Tricuspid valve regurgitation is mild  to moderate.   7. The aortic valve is tricuspid. Aortic valve regurgitation is not  visualized.   8. Aortic dilatation noted. There is borderline dilatation of the aortic  root, measuring 38 mm.   9. The inferior vena cava is dilated in size with >50% respiratory  variability, suggesting right atrial pressure of 8 mmHg.   Antimicrobials:  Anti-infectives (From admission, onward)    None       Subjective: No new complaints Continued SOB with minimal exertion  Objective: Vitals:   06/01/22 2035 06/02/22 0551 06/02/22 1055 06/02/22 1703  BP: (!) 144/116 (!) 127/103 109/83 (!) 134/104  Pulse: (!) 36 83  97  Resp:    17  Temp: 98.5 F (36.9 C) 97.7 F (36.5 C)  98.3 F (36.8 C)  TempSrc: Oral Oral  Oral  SpO2: 95% 96%  96%  Weight:  (!) 153.5 kg    Height:         Intake/Output Summary (Last 24 hours) at 06/02/2022 1722 Last data filed at 06/02/2022 0550 Gross per 24 hour  Intake 540 ml  Output 675 ml  Net -135 ml   Filed Weights   06/01/22 0534 06/01/22 1325 06/02/22 0551  Weight: (!) 154 kg (!) 153.8 kg (!) 153.5 kg    Examination:  General: No acute distress. Cardiovascular: irregularly irregular Lungs: CTAB Abdomen: Soft, nontender, nondistended  Neurological: Alert and oriented 3. Moves all extremities 4. Cranial nerves II through XII grossly intact. Extremities: bilateral LE edema  Data Reviewed: I have personally reviewed following labs and imaging studies  CBC: Recent Labs  Lab 05/29/22 1450 05/29/22 2313 06/01/22 0217 06/02/22 0202  WBC 6.1 6.3 6.2 5.6  NEUTROABS 2.9  --  2.6 2.2  HGB 15.1 16.1 17.0 16.3  HCT 45.3 46.5 48.9 49.5  MCV 91.9 88.2 89.1 90.7  PLT 306 323 326 [id number]    Basic Metabolic Panel: Recent Labs  Lab 05/29/22 1450 05/29/22 2313 05/31/22 0224 06/01/22 0217 06/02/22 0202  NA 137 138 139 138 138  K 4.0 3.3* 3.4* 4.2 3.7  CL 105 101 98 100 99  CO2 24 26 28 24 26   GLUCOSE 101* 132* 90 99 98  BUN 11 10 16 16 15   CREATININE 1.32* 1.26* 1.32* 1.36* 1.41*  CALCIUM 8.8* 8.9 9.1 9.1 8.9  MG  --  2.0 1.9 2.4 2.2  PHOS  --  3.5  --  5.3* 5.0*    GFR: Estimated Creatinine Clearance: 95.3 mL/min (Yamaris Cummings) (by C-G formula based on SCr of 1.41 mg/dL (H)).  Liver Function Tests: Recent Labs  Lab 05/29/22 1450 06/01/22 0217  AST 32 35  ALT 27 24  ALKPHOS 62 67  BILITOT 0.9 0.8  PROT 7.3 8.3*  ALBUMIN 3.3* 3.4*    CBG: No results for input(s): "GLUCAP" in the last 168 hours.   Recent Results (from the past 240 hour(s))  Resp Panel by RT-PCR (Flu Annayah Worthley&B, Covid) Anterior Nasal Swab     Status: None   Collection Time: 05/29/22  6:57 PM   Specimen: Anterior Nasal Swab  Result Value Ref Range Status   SARS Coronavirus 2 by RT PCR NEGATIVE NEGATIVE Final    Comment: (NOTE) SARS-CoV-2 target  nucleic acids are NOT DETECTED.  The SARS-CoV-2 RNA is generally detectable in upper respiratory specimens during the acute phase of infection. The lowest concentration of SARS-CoV-2 viral copies this assay can detect is 138 copies/mL. Zymier Rodgers negative result does not preclude SARS-Cov-2 infection and should not be used as the sole basis for treatment or other patient management decisions. Neftali Thurow negative result may occur with  improper specimen collection/handling, submission of specimen other than nasopharyngeal swab, presence of viral mutation(s) within the areas targeted by this assay, and inadequate number of viral copies(<138 copies/mL). Canna Nickelson negative result must be combined with clinical observations, patient history, and epidemiological information. The expected result is Negative.  Fact Sheet for Patients:  EntrepreneurPulse.com.au  Fact Sheet for Healthcare Providers:  IncredibleEmployment.be  This test is no t yet approved or cleared by the [place] FDA and  has  been authorized for detection and/or diagnosis of SARS-CoV-2 by FDA under an Emergency Use Authorization (EUA). This EUA will remain  in effect (meaning this test can be used) for the duration of the COVID-19 declaration under Section 564(b)(1) of the Act, 21 U.S.C.section 360bbb-3(b)(1), unless the authorization is terminated  or revoked sooner.       Influenza Carr Shartzer by PCR NEGATIVE NEGATIVE Final   Influenza B by PCR NEGATIVE NEGATIVE Final    Comment: (NOTE) The Xpert Xpress SARS-CoV-2/FLU/RSV plus assay is intended as an aid in the diagnosis of influenza from Nasopharyngeal swab specimens and should not be used as Diannia Hogenson sole basis for treatment. Nasal washings and aspirates are unacceptable for Xpert Xpress SARS-CoV-2/FLU/RSV testing.  Fact Sheet for Patients: EntrepreneurPulse.com.au  Fact Sheet for Healthcare  Providers: IncredibleEmployment.be  This test is not yet approved or cleared by the [place] FDA and has been authorized for detection and/or diagnosis of SARS-CoV-2 by FDA under an Emergency Use Authorization (EUA). This EUA will remain in effect (meaning this test can be used) for the duration of the COVID-19 declaration under Section 564(b)(1) of the Act, 21 U.S.C. section 360bbb-3(b)(1), unless the authorization is terminated or revoked.  Performed at [facility], [place] [address], [place], [place] [address]          Radiology Studies: VAS [place] LOWER EXTREMITY VENOUS (DVT)  Result Date: 06/02/2022  Lower Venous DVT Study Patient Name:  SORA OLIVO  Date of Exam:   06/02/2022 Medical Rec #: [id number]       Accession #:    [id number] Date of Birth: [birth year]        Patient Gender: M Patient Age:   51 years Exam Location:  [facility] Procedure:      VAS [place] LOWER EXTREMITY VENOUS (DVT) Referring Phys: Jacquie Lukes POWELL JR --------------------------------------------------------------------------------  Indications: D-dimer.  Comparison Study: No prior studies. Performing Technologist: Darlin Coco RDMS, RVT  Examination Guidelines: Areana Kosanke complete evaluation includes B-mode imaging, spectral Doppler, color Doppler, and power Doppler as needed of all accessible portions of each vessel. Bilateral testing is considered an integral part of Guillermo Nehring complete examination. Limited examinations for reoccurring indications may be performed as noted. The reflux portion of the exam is performed with the patient in reverse Trendelenburg.  +---------+---------------+---------+-----------+----------+--------------+ RIGHT    CompressibilityPhasicitySpontaneityPropertiesThrombus Aging +---------+---------------+---------+-----------+----------+--------------+ CFV      Full           Yes      Yes                                  +---------+---------------+---------+-----------+----------+--------------+ SFJ      Full                                                        +---------+---------------+---------+-----------+----------+--------------+ FV Prox  Full                                                        +---------+---------------+---------+-----------+----------+--------------+ FV Mid   Full                                                        +---------+---------------+---------+-----------+----------+--------------+  FV DistalFull                                                        +---------+---------------+---------+-----------+----------+--------------+ PFV      Full                                                        +---------+---------------+---------+-----------+----------+--------------+ POP      Full           Yes      Yes                                 +---------+---------------+---------+-----------+----------+--------------+ PTV      Full                                                        +---------+---------------+---------+-----------+----------+--------------+ PERO     Full                                                        +---------+---------------+---------+-----------+----------+--------------+ Gastroc  Full                                                        +---------+---------------+---------+-----------+----------+--------------+   +---------+---------------+---------+-----------+----------+--------------+ LEFT     CompressibilityPhasicitySpontaneityPropertiesThrombus Aging +---------+---------------+---------+-----------+----------+--------------+ CFV      Full           Yes      Yes                                 +---------+---------------+---------+-----------+----------+--------------+ SFJ      Full                                                         +---------+---------------+---------+-----------+----------+--------------+ FV Prox  Full                                                        +---------+---------------+---------+-----------+----------+--------------+ FV Mid   Full                                                        +---------+---------------+---------+-----------+----------+--------------+  FV DistalFull                                                        +---------+---------------+---------+-----------+----------+--------------+ PFV      Full                                                        +---------+---------------+---------+-----------+----------+--------------+ POP      Full           Yes      Yes                                 +---------+---------------+---------+-----------+----------+--------------+ PTV      Full                                                        +---------+---------------+---------+-----------+----------+--------------+ PERO     Full                                                        +---------+---------------+---------+-----------+----------+--------------+ Gastroc  Full                                                        +---------+---------------+---------+-----------+----------+--------------+     Summary: RIGHT: - There is no evidence of deep vein thrombosis in the lower extremity.  - No cystic structure found in the popliteal fossa.  LEFT: - There is no evidence of deep vein thrombosis in the lower extremity.  - No cystic structure found in the popliteal fossa.  *See table(s) above for measurements and observations.    Preliminary    DG CHEST PORT 1 VIEW  Result Date: 06/02/2022 CLINICAL DATA:  Shortness of breath.  Leg swelling. EXAM: PORTABLE CHEST 1 VIEW COMPARISON:  Chest radiographs 05/29/2022 FINDINGS: The cardiac silhouette remains mildly enlarged. Mild scarring is again noted in the left lower lung. No acute airspace  consolidation, edema, sizable pleural effusion, or pneumothorax is identified. No acute osseous abnormality is seen. IMPRESSION: No active disease. Electronically Signed   By: Logan Bores M.D.   On: 06/02/2022 12:36   ECHO TEE  Result Date: 06/01/2022    TRANSESOPHOGEAL ECHO REPORT   Patient Name:   Anthony Delgado Date of Exam: 06/01/2022 Medical Rec #:  [id number]      Height:       70.0 in Accession #:    [id number]     Weight:       339.0 lb Date of Birth:  [birth year]       BSA:          2.612 m  Patient Age:    57 years       BP:           131/116 mmHg Patient Gender: M              HR:           113 bpm. Exam Location:  Inpatient Procedure: 3D Echo, Transesophageal Echo, Cardiac Doppler and Color Doppler Indications:     I48.91* Unspeicified atrial fibrillation  History:         Patient has prior history of Echocardiogram examinations.                  Abnormal ECG, TIA, Arrythmias:Atrial Fibrillation;                  Signs/Symptoms:Chest Pain.  Sonographer:     Greer Pickerel Sonographer#2:   Roseanna Rainbow RDCS Referring Phys:  Lelon Perla Diagnosing Phys: Candee Furbish MD PROCEDURE: After discussion of the risks and benefits of Doloros Kwolek TEE, an informed consent was obtained from the patient. The transesophogeal probe was passed without difficulty through the esophogus of the patient. Imaged were obtained with the patient in [place] Lapid left lateral decubitus position. Sedation performed by different physician. The patient was monitored while under deep sedation. Anesthestetic sedation was provided intravenously by Anesthesiology: 313mg  of Propofol, 40mg  of Lidocaine. The patient's vital signs; including heart rate, blood pressure, and oxygen saturation; remained stable throughout the procedure. The patient developed no complications during the procedure. An unsuccessful direct current cardioversion was performed at 200 joules with  3 attempts. Only 2-3 beats of sinus beats post conversion noted before return to atrial  fibrillation. IMPRESSIONS  1. Left ventricular ejection fraction, by estimation, is 30 to 35%. The left ventricle has moderate to severely decreased function. The left ventricle demonstrates global hypokinesis. The left ventricular internal cavity size was mildly dilated.  2. Right ventricular systolic function is mildly reduced. The right ventricular size is mildly enlarged. There is normal pulmonary artery systolic pressure.  3. Left atrial size was moderately dilated. No left atrial/left atrial appendage thrombus was detected.  4. Right atrial size was moderately dilated.  5. The mitral valve is normal in structure. Trivial mitral valve regurgitation.  6. Tricuspid valve regurgitation is severe.  7. The aortic valve is tricuspid. Aortic valve regurgitation is trivial.  8. Only 2-3 beats of sinus beats post conversion noted before return to atrial fibrillation. FINDINGS  Left Ventricle: Left ventricular ejection fraction, by estimation, is 30 to 35%. The left ventricle has moderate to severely decreased function. The left ventricle demonstrates global hypokinesis. The left ventricular internal cavity size was mildly dilated. Right Ventricle: The right ventricular size is mildly enlarged. No increase in right ventricular wall thickness. Right ventricular systolic function is mildly reduced. There is normal pulmonary artery systolic pressure. The tricuspid regurgitant velocity  is 2.04 m/s, and with an assumed right atrial pressure of 3 mmHg, the estimated right ventricular systolic pressure is [id number] mmHg. Left Atrium: Left atrial size was moderately dilated. Spontaneous echo contrast was present. No left atrial/left atrial appendage thrombus was detected. Right Atrium: Right atrial size was moderately dilated. Pericardium: There is no evidence of pericardial effusion. Mitral Valve: The mitral valve is normal in structure. Trivial mitral valve regurgitation. Tricuspid Valve: The tricuspid valve is normal in  structure. Tricuspid valve regurgitation is severe. Aortic Valve: The aortic valve is tricuspid. Aortic valve regurgitation is trivial. Pulmonic Valve: The pulmonic valve was normal in structure. Pulmonic valve  regurgitation is trivial. Aorta: The aortic root is normal in size and structure. IAS/Shunts: No atrial level shunt detected by color flow Doppler.  TRICUSPID VALVE TR Peak grad:   16.6 mmHg TR Vmax:        204.00 cm/s Candee Furbish MD Electronically signed by Candee Furbish MD Signature Date/Time: 06/01/2022/4:26:26 PM    Final         Scheduled Meds:  apixaban  5 mg Oral BID   carvedilol  6.25 mg Oral BID WC   dapagliflozin propanediol  10 mg Oral Daily   furosemide  40 mg Oral BID   potassium chloride  20 mEq Oral Once   sacubitril-valsartan  1 tablet Oral BID   spironolactone  12.5 mg Oral Daily   Continuous Infusions:     LOS: 4 days    Time spent: over 30 min    Fayrene Helper, MD [facility]   To contact the attending provider between 7A-7P or the covering provider during after hours 7P-7A, please log into the web site [email or web address] and access using universal [place] password for that web site. If you do not have the password, please call the hospital operator.  06/02/2022, 5:22 PM

## 2022-06-02 NOTE — Progress Notes (Signed)
Patient ambulated to sink to shave his face. He bacame markedly short of breath. He had to sit down and in a tripod position on side of bed, recover which took about 3 to 4 minutes. Heart rate was afib and up in the 130s, sats were 93% on RA. Lungs where clear. He recooperated. Dr. Florene Glen on floor and made aware. He came to see patient

## 2022-06-03 LAB — CBC WITH DIFFERENTIAL/PLATELET
Abs Immature Granulocytes: 0.01 10*3/uL (ref 0.00–0.07)
Basophils Absolute: 0.1 10*3/uL (ref 0.0–0.1)
Basophils Relative: 1 %
Eosinophils Absolute: 0.3 10*3/uL (ref 0.0–0.5)
Eosinophils Relative: 6 %
HCT: 49.5 % (ref 39.0–52.0)
Hemoglobin: 17.1 g/dL — ABNORMAL HIGH (ref 13.0–17.0)
Immature Granulocytes: 0 %
Lymphocytes Relative: 46 %
Lymphs Abs: 2.3 10*3/uL (ref 0.7–4.0)
MCH: 30.8 pg (ref 26.0–34.0)
MCHC: 34.5 g/dL (ref 30.0–36.0)
MCV: 89 fL (ref 80.0–100.0)
Monocytes Absolute: 0.4 10*3/uL (ref 0.1–1.0)
Monocytes Relative: 8 %
Neutro Abs: 2 10*3/uL (ref 1.7–7.7)
Neutrophils Relative %: 39 %
Platelets: 318 10*3/uL (ref 150–400)
RBC: 5.56 MIL/uL (ref 4.22–5.81)
RDW: 13.2 % (ref 11.5–15.5)
WBC: 5.1 10*3/uL (ref 4.0–10.5)
nRBC: 0 % (ref 0.0–0.2)

## 2022-06-03 LAB — COMPREHENSIVE METABOLIC PANEL
ALT: 26 U/L (ref 0–44)
AST: 35 U/L (ref 15–41)
Albumin: 3.1 g/dL — ABNORMAL LOW (ref 3.5–5.0)
Alkaline Phosphatase: 59 U/L (ref 38–126)
Anion gap: 10 (ref 5–15)
BUN: 12 mg/dL (ref 6–20)
CO2: 24 mmol/L (ref 22–32)
Calcium: 8.7 mg/dL — ABNORMAL LOW (ref 8.9–10.3)
Chloride: 102 mmol/L (ref 98–111)
Creatinine, Ser: 1.32 mg/dL — ABNORMAL HIGH (ref 0.61–1.24)
GFR, Estimated: >60 mL/min (ref >60)
Glucose, Bld: 108 mg/dL — ABNORMAL HIGH (ref 70–99)
Potassium: 4 mmol/L (ref 3.5–5.1)
Sodium: 136 mmol/L (ref 135–145)
Total Bilirubin: 0.7 mg/dL (ref 0.3–1.2)
Total Protein: 7.7 g/dL (ref 6.5–8.1)

## 2022-06-03 MED ORDER — CARVEDILOL 12.5 MG PO TABS: 12.5000 mg | ORAL_TABLET | Freq: Two times a day (BID) | ORAL | Status: DC

## 2022-06-03 MED ORDER — CARVEDILOL 12.5 MG PO TABS: 12.5000 mg | ORAL_TABLET | Freq: Two times a day (BID) | ORAL | 1 refills | Status: DC

## 2022-06-03 MED ORDER — CARVEDILOL 6.25 MG PO TABS
6.2500 mg | ORAL_TABLET | Freq: Once | ORAL | Status: AC
  Administered 2022-06-03: 6.25 mg via ORAL
  Filled 2022-06-03: qty 1

## 2022-06-03 NOTE — Discharge Summary (Signed)
Physician Discharge Summary  Anthony Delgado [id number] DOB: 26-Feb-1976 DOA: 05/29/2022  PCP: Minette Brine, FNP  Admit date: 05/29/2022 Discharge date: 06/03/2022  Time spent: 40 minutes  Recommendations for Outpatient Follow-up:  Follow outpatient CBC/CMP Follow outpatient cardiology Follow rate on coreg, adjust as needed Follow volume status/lytes/creatinine on lasix Follow lytes/renal function on entresto/jardiance/spironolactone Follow abnormal CT scan outpatient  Discharge Diagnoses:  Principal Problem:   Atrial fibrillation with RVR [facility])   Discharge Condition: stable  Diet recommendation: heart healthy  Filed Weights   06/01/22 1325 06/02/22 0551 06/03/22 0539  Weight: (!) 153.8 kg (!) 153.5 kg (!) 153 kg    History of present illness:  46 year old male with history of significant of morbid obesity, HTN, ran out of his medication about 3 months ago due to PCP leaving and closing his practice and patient having difficulty establishing care, comes into the hospital with worsening bilateral lower extremity edema, dyspnea, chest tightness.  He was found to be in [place]-fib with RVR, significantly fluid overloaded and was admitted to the hospital.  He was placed on diltiazem infusion, Eliquis and cardiology was consulted. He tells me his weight is usually in the 330s, it was as high as 350s when he saw his PCP for new visit.  He was found to have HFrEF exacerbation and new onset afib with RVR.  He's s/p TEE cardioversion, but remains in atrial fibrillation.  He's improved after diuresis and rate control.  Plan for outpatient follow up with cardiology for further management.  See below for additional details    Hospital Course:  Assessment and Plan: Shortness of breath with exertion Improved CT PE protocol without PE, negative LE [place] Faint ground glass densities in both lower lung fields suggesting scarring or interstitial pneumonia (follow outpatient)  Paroxysmal Atrial  Fibrillation with RVR TSH wnl, echo with decreased EF as noted below, mildly dilated LA S/p TEE cardioversion 10/5, but back in atrial fibrillation  Coreg for rate control - does increased to 12.5 mg BID Eliquis Appreciate cardiology assistance - follow with [facility] to consider antiarrythmic vs ablation Will continue to monitor and adjust prn   Heart failure with reduced ejection fraction, exacerbation  History of Diastolic HF Echo with EF [id number], moderately decreased function, global hypokinesis, RVSF low normal (see report)  Volume overloaded on exam, continued LE edema, gradually improving Strict I/O, daily weights Appreciate cardiology recs -> entresto, coreg, spironolactone, farxiga -> repeat echo 3-6 months, needs ischemic evaluation if LV remains decreased  Acute hypoxic respiratory failure-due to #1&2, currently on RA - resolved   Essential hypertension continue meds as above   Obesity, morbid-BMI 49.8.  He would benefit from weight loss.      Procedures: TEE IMPRESSIONS    1. Left ventricular ejection fraction, by estimation, is 30 to 35%. The  left ventricle has moderate to severely decreased function. The left  ventricle demonstrates global hypokinesis. The left ventricular internal  cavity size was mildly dilated.  2. Right ventricular systolic function is mildly reduced. The right  ventricular size is mildly enlarged. There is normal pulmonary artery  systolic pressure.  3. Left atrial size was moderately dilated. No left atrial/left atrial  appendage thrombus was detected.  4. Right atrial size was moderately dilated.  5. The mitral valve is normal in structure. Trivial mitral valve  regurgitation.  6. Tricuspid valve regurgitation is severe.  7. The aortic valve is tricuspid. Aortic valve regurgitation is trivial.  8. Only 2-3 beats of sinus beats  post conversion noted before return to  atrial fibrillation.   Echo IMPRESSIONS     1. Left  ventricular ejection fraction, by estimation, is 35 to 40%. The  left ventricle has moderately decreased function. The left ventricle  demonstrates global hypokinesis. There is mild concentric left ventricular  hypertrophy. Left ventricular  diastolic function could not be evaluated.   2. Right ventricular systolic function is low normal. The right  ventricular size is mildly enlarged. There is normal pulmonary artery  systolic pressure. The estimated right ventricular systolic pressure is  72.0 mmHg.   3. Left atrial size was mildly dilated.   4. Right atrial size was severely dilated.   5. The mitral valve is normal in structure. Trivial mitral valve  regurgitation.   6. The tricuspid valve is abnormal. Tricuspid valve regurgitation is mild  to moderate.   7. The aortic valve is tricuspid. Aortic valve regurgitation is not  visualized.   8. Aortic dilatation noted. There is borderline dilatation of the aortic  root, measuring 38 mm.   9. The inferior vena cava is dilated in size with >50% respiratory  variability, suggesting right atrial pressure of 8 mmHg.  LE [place] Summary:  RIGHT:  - There is no evidence of deep vein thrombosis in the lower extremity.     - No cystic structure found in the popliteal fossa.     LEFT:  - There is no evidence of deep vein thrombosis in the lower extremity.     - No cystic structure found in the popliteal fossa.      Consultations: cardiology  Discharge Exam: Vitals:   06/03/22 0539 06/03/22 0815  BP: 97/84 (!) 111/94  Pulse: 87 95  Resp: 17   Temp: 97.8 F (36.6 C)   SpO2: 91%    No new complaints Eager to discharge if possible  General: No acute distress. Cardiovascular: irregularly irregular, rates in 90's and low 100's Lungs: unlabored Abdomen: Soft, nontender, nondistended Neurological: Alert and oriented 3. Moves all extremities 4 with equal strength. Cranial nerves II through XII grossly intact. Extremities: bilateral LE  edema  Discharge Instructions   Discharge Instructions     Call MD for:  difficulty breathing, headache or visual disturbances   Complete by: As directed    Call MD for:  extreme fatigue   Complete by: As directed    Call MD for:  hives   Complete by: As directed    Call MD for:  persistant dizziness or light-headedness   Complete by: As directed    Call MD for:  persistant nausea and vomiting   Complete by: As directed    Call MD for:  redness, tenderness, or signs of infection (pain, swelling, redness, odor or green/yellow discharge around incision site)   Complete by: As directed    Call MD for:  severe uncontrolled pain   Complete by: As directed    Call MD for:  temperature >100.4   Complete by: As directed    Diet - low sodium heart healthy   Complete by: As directed    Discharge instructions   Complete by: As directed    You were seen for new atrial fibrillation as well as heart failure.  You had Costantino Kohlbeck cardioversion, but you remain in the abnormal atrial fibrillation heart rhythm.   We've started you on several new medicines for your heart failure and atrial fibrillation.  You're on eliquis (Coby [place] blood thinner) and coreg (beta blocker for your heart rate) for  your atrial fibrillation.  Your entresto, jardiance, coreg, spironolacatone, and lasix are for your heart failure.  I increased your coreg to 12.5 mg twice Philippe Gang day.  Pick this up from the pharmacy.    Your CT scan showed patchy ground glass opacities concerning for scarring or interstitial pneumonia.  Follow with your PCP outpatient to consider additional imaging.  Follow up with cardiology outpatient as planned.    Follow up with your PCP.  Return for new, recurrent, or worsening symptoms.  Please ask your PCP to request records from this hospitalization so they know what was done and what the next steps will be.   Increase activity slowly   Complete by: As directed       Allergies as of 06/03/2022   No Known  Allergies      Medication List     STOP taking these medications    aspirin EC 81 MG tablet   UNABLE TO FIND       TAKE these medications    albuterol 108 (90 Base) MCG/ACT inhaler Commonly known as: VENTOLIN HFA Inhale 2 puffs into the lungs every 4 (four) hours as needed for wheezing or shortness of breath.   amLODipine 10 MG tablet Commonly known as: NORVASC Take 10 mg by mouth daily.   carvedilol 12.5 MG tablet Commonly known as: COREG Take 1 tablet (12.5 mg total) by mouth 2 (two) times daily with Orlan Aversa meal.   Eliquis 5 MG Tabs tablet Generic drug: apixaban Take 1 tablet (5 mg total) by mouth 2 (two) times daily.   Entresto 49-51 MG Generic drug: sacubitril-valsartan Take 1 tablet by mouth 2 (two) times daily.   Farxiga 10 MG Tabs tablet Generic drug: dapagliflozin propanediol Take 1 tablet (10 mg total) by mouth daily.   furosemide 40 MG tablet Commonly known as: LASIX Take 1 tablet (40 mg total) by mouth 2 (two) times daily.   spironolactone 25 MG tablet Commonly known as: ALDACTONE Take 0.5 tablets (12.5 mg total) by mouth daily.   traMADol 50 MG tablet Commonly known as: ULTRAM Take 1 tablet (50 mg total) by mouth every 6 (six) hours as needed.       No Known Allergies  Follow-up Information     [facility]. Go in 10 day(s).   Specialty: Cardiology Why: Hospital follow up PLEASE bring Jeanann Balinski current medication list to appointment FREE valet parking, [address], off [employer] information: [address] [id number] [place]        Minette Brine, [place] Follow up.   Specialty: General Practice Contact information: [address] [place] [address] [phone]         Lelon Perla, MD .   Specialty: Cardiology Contact information: [address] [place] [address] [phone]                   The results of significant diagnostics from this hospitalization (including imaging, microbiology, ancillary and laboratory) are listed below for reference.    Significant Diagnostic Studies: VAS [place] LOWER EXTREMITY VENOUS (DVT)  Result Date: 06/03/2022  Lower Venous DVT Study Patient Name:  Anthony Delgado  Date of Exam:   06/02/2022 Medical Rec #: [id number]       Accession #:    [id number] Date of Birth: [birth year]        Patient Gender: M Patient Age:   33 years Exam Location:  [facility]  [facility] Procedure:      VAS [place] LOWER EXTREMITY VENOUS (DVT) Referring Phys: Drake Wuertz POWELL JR --------------------------------------------------------------------------------  Indications: D-dimer.  Comparison Study: No prior studies. Performing Technologist: Darlin Coco RDMS, RVT  Examination Guidelines: Asahel Risden complete evaluation includes B-mode imaging, spectral Doppler, color Doppler, and power Doppler as needed of all accessible portions of each vessel. Bilateral testing is considered an integral part of Oleva Koo complete examination. Limited examinations for reoccurring indications may be performed as noted. The reflux portion of the exam is performed with the patient in reverse Trendelenburg.  +---------+---------------+---------+-----------+----------+--------------+ RIGHT    CompressibilityPhasicitySpontaneityPropertiesThrombus Aging +---------+---------------+---------+-----------+----------+--------------+ CFV      Full           Yes      Yes                                 +---------+---------------+---------+-----------+----------+--------------+ SFJ      Full                                                        +---------+---------------+---------+-----------+----------+--------------+ FV Prox  Full                                                        +---------+---------------+---------+-----------+----------+--------------+ FV Mid   Full                                                         +---------+---------------+---------+-----------+----------+--------------+ FV DistalFull                                                        +---------+---------------+---------+-----------+----------+--------------+ PFV      Full                                                        +---------+---------------+---------+-----------+----------+--------------+ POP      Full           Yes      Yes                                 +---------+---------------+---------+-----------+----------+--------------+ PTV      Full                                                        +---------+---------------+---------+-----------+----------+--------------+ PERO     Full                                                        +---------+---------------+---------+-----------+----------+--------------+  Gastroc  Full                                                        +---------+---------------+---------+-----------+----------+--------------+   +---------+---------------+---------+-----------+----------+--------------+ LEFT     CompressibilityPhasicitySpontaneityPropertiesThrombus Aging +---------+---------------+---------+-----------+----------+--------------+ CFV      Full           Yes      Yes                                 +---------+---------------+---------+-----------+----------+--------------+ SFJ      Full                                                        +---------+---------------+---------+-----------+----------+--------------+ FV Prox  Full                                                        +---------+---------------+---------+-----------+----------+--------------+ FV Mid   Full                                                        +---------+---------------+---------+-----------+----------+--------------+ FV DistalFull                                                         +---------+---------------+---------+-----------+----------+--------------+ PFV      Full                                                        +---------+---------------+---------+-----------+----------+--------------+ POP      Full           Yes      Yes                                 +---------+---------------+---------+-----------+----------+--------------+ PTV      Full                                                        +---------+---------------+---------+-----------+----------+--------------+ PERO     Full                                                        +---------+---------------+---------+-----------+----------+--------------+  Gastroc  Full                                                        +---------+---------------+---------+-----------+----------+--------------+     Summary: RIGHT: - There is no evidence of deep vein thrombosis in the lower extremity.  - No cystic structure found in the popliteal fossa.  LEFT: - There is no evidence of deep vein thrombosis in the lower extremity.  - No cystic structure found in the popliteal fossa.  *See table(s) above for measurements and observations. Electronically signed by Orlie Pollen on 06/03/2022 at 11:12:59 AM.    Final    CT Angio Chest Pulmonary Embolism (PE) W or WO Contrast  Result Date: 06/02/2022 CLINICAL DATA:  Chest pain, shortness of breath EXAM: CT ANGIOGRAPHY CHEST WITH CONTRAST TECHNIQUE: Multidetector CT imaging of the chest was performed using the standard protocol during bolus administration of intravenous contrast. Multiplanar CT image reconstructions and MIPs were obtained to evaluate the vascular anatomy. RADIATION DOSE REDUCTION: This exam was performed according to the departmental dose-optimization program which includes automated exposure control, adjustment of the mA and/or kV according to patient size and/or use of iterative reconstruction technique. CONTRAST:  85mL OMNIPAQUE IOHEXOL  350 MG/ML SOLN COMPARISON:  Previous studies including the chest radiographs done earlier today FINDINGS: Cardiovascular: There are no intraluminal filling defects in pulmonary artery branches. Contrast density in the lumen of thoracic aorta is less than adequate to evaluate for dissection. Heart is enlarged in size. Mediastinum/Nodes: No significant lymphadenopathy is seen. Lungs/Pleura: There are small linear densities in lower lung fields. Faint patchy ground-glass densities are seen in lower lung fields. There is no pleural effusion or pneumothorax. Upper Abdomen: There is 8 mm calcific density in the upper pole of left kidney. Surgical clips are seen in gallbladder fossa. Musculoskeletal: Unremarkable. Review of the MIP images confirms the above findings. IMPRESSION: There is no evidence of pulmonary artery embolism. There is no focal pulmonary consolidation. Small linear densities in the lower lung fields may suggest scarring or subsegmental atelectasis. There are patchy faint ground-glass densities in both lower lung fields suggesting scarring or interstitial pneumonia. 8 mm left renal calculus. Electronically Signed   By: Elmer Picker M.D.   On: 06/02/2022 19:01   DG CHEST PORT 1 VIEW  Result Date: 06/02/2022 CLINICAL DATA:  Shortness of breath.  Leg swelling. EXAM: PORTABLE CHEST 1 VIEW COMPARISON:  Chest radiographs 05/29/2022 FINDINGS: The cardiac silhouette remains mildly enlarged. Mild scarring is again noted in the left lower lung. No acute airspace consolidation, edema, sizable pleural effusion, or pneumothorax is identified. No acute osseous abnormality is seen. IMPRESSION: No active disease. Electronically Signed   By: Logan Bores M.D.   On: 06/02/2022 12:36   ECHO TEE  Result Date: 06/01/2022    TRANSESOPHOGEAL ECHO REPORT   Patient Name:   Anthony Delgado Date of Exam: 06/01/2022 Medical Rec #:  [id number]      Height:       70.0 in Accession #:    [id number]     Weight:       339.0  lb Date of Birth:  06/11/1976       BSA:          2.612 m Patient Age:    46 years       BP:  131/116 mmHg Patient Gender: M              HR:           113 bpm. Exam Location:  Inpatient Procedure: 3D Echo, Transesophageal Echo, Cardiac Doppler and Color Doppler Indications:     I48.91* Unspeicified atrial fibrillation  History:         Patient has prior history of Echocardiogram examinations.                  Abnormal ECG, TIA, Arrythmias:Atrial Fibrillation;                  Signs/Symptoms:Chest Pain.  Sonographer:     Greer Pickerel Sonographer#2:   Roseanna Rainbow RDCS Referring Phys:  Lelon Perla Diagnosing Phys: Candee Furbish MD PROCEDURE: After discussion of the risks and benefits of Hailey Stormer TEE, an informed consent was obtained from the patient. The transesophogeal probe was passed without difficulty through the esophogus of the patient. Imaged were obtained with the patient in Shayleigh Bouldin left lateral decubitus position. Sedation performed by different physician. The patient was monitored while under deep sedation. Anesthestetic sedation was provided intravenously by Anesthesiology: 313mg  of Propofol, 40mg  of Lidocaine. The patient's vital signs; including heart rate, blood pressure, and oxygen saturation; remained stable throughout the procedure. The patient developed no complications during the procedure. An unsuccessful direct current cardioversion was performed at 200 joules with  3 attempts. Only 2-3 beats of sinus beats post conversion noted before return to atrial fibrillation. IMPRESSIONS  1. Left ventricular ejection fraction, by estimation, is 30 to 35%. The left ventricle has moderate to severely decreased function. The left ventricle demonstrates global hypokinesis. The left ventricular internal cavity size was mildly dilated.  2. Right ventricular systolic function is mildly reduced. The right ventricular size is mildly enlarged. There is normal pulmonary artery systolic pressure.  3. Left atrial size was  moderately dilated. No left atrial/left atrial appendage thrombus was detected.  4. Right atrial size was moderately dilated.  5. The mitral valve is normal in structure. Trivial mitral valve regurgitation.  6. Tricuspid valve regurgitation is severe.  7. The aortic valve is tricuspid. Aortic valve regurgitation is trivial.  8. Only 2-3 beats of sinus beats post conversion noted before return to atrial fibrillation. FINDINGS  Left Ventricle: Left ventricular ejection fraction, by estimation, is 30 to 35%. The left ventricle has moderate to severely decreased function. The left ventricle demonstrates global hypokinesis. The left ventricular internal cavity size was mildly dilated. Right Ventricle: The right ventricular size is mildly enlarged. No increase in right ventricular wall thickness. Right ventricular systolic function is mildly reduced. There is normal pulmonary artery systolic pressure. The tricuspid regurgitant velocity  is 2.04 m/s, and with an assumed right atrial pressure of 3 mmHg, the estimated right ventricular systolic pressure is [id number] mmHg. Left Atrium: Left atrial size was moderately dilated. Spontaneous echo contrast was present. No left atrial/left atrial appendage thrombus was detected. Right Atrium: Right atrial size was moderately dilated. Pericardium: There is no evidence of pericardial effusion. Mitral Valve: The mitral valve is normal in structure. Trivial mitral valve regurgitation. Tricuspid Valve: The tricuspid valve is normal in structure. Tricuspid valve regurgitation is severe. Aortic Valve: The aortic valve is tricuspid. Aortic valve regurgitation is trivial. Pulmonic Valve: The pulmonic valve was normal in structure. Pulmonic valve regurgitation is trivial. Aorta: The aortic root is normal in size and structure. IAS/Shunts: No atrial level shunt detected by color flow Doppler.  TRICUSPID  VALVE TR Peak grad:   16.6 mmHg TR Vmax:        204.00 cm/s Candee Furbish MD Electronically  signed by Candee Furbish MD Signature Date/Time: 06/01/2022/4:26:26 PM    Final    ECHOCARDIOGRAM COMPLETE  Result Date: 05/30/2022    ECHOCARDIOGRAM REPORT   Patient Name:   Anthony Delgado Date of Exam: 05/30/2022 Medical Rec #:  [id number]      Height:       70.0 in Accession #:    [id number]     Weight:       347.2 lb Date of Birth:  1976-01-26       BSA:          2.639 m Patient Age:    24 years       BP:           128/88 mmHg Patient Gender: M              HR:           94 bpm. Exam Location:  Inpatient Procedure: 2D Echo, Cardiac Doppler and Color Doppler Indications:    [id number] Acute diastolic (congestive) heart failure  History:        Patient has no prior history of Echocardiogram examinations.                 TIA, Arrythmias:Atrial Fibrillation; Signs/Symptoms:Chest Pain.  Sonographer:    Greer Pickerel Sonographer#2:  Raquel Sarna Senior Referring Phys: Kayleen Memos  Sonographer Comments: Technically difficult study due to poor echo windows and patient is obese. IMPRESSIONS  1. Left ventricular ejection fraction, by estimation, is 35 to 40%. The left ventricle has moderately decreased function. The left ventricle demonstrates global hypokinesis. There is mild concentric left ventricular hypertrophy. Left ventricular diastolic function could not be evaluated.  2. Right ventricular systolic function is low normal. The right ventricular size is mildly enlarged. There is normal pulmonary artery systolic pressure. The estimated right ventricular systolic pressure is [id number] mmHg.  3. Left atrial size was mildly dilated.  4. Right atrial size was severely dilated.  5. The mitral valve is normal in structure. Trivial mitral valve regurgitation.  6. The tricuspid valve is abnormal. Tricuspid valve regurgitation is mild to moderate.  7. The aortic valve is tricuspid. Aortic valve regurgitation is not visualized.  8. Aortic dilatation noted. There is borderline dilatation of the aortic root, measuring 38 mm.  9. The inferior vena  cava is dilated in size with >50% respiratory variability, suggesting right atrial pressure of 8 mmHg. FINDINGS  Left Ventricle: Left ventricular ejection fraction, by estimation, is 35 to 40%. The left ventricle has moderately decreased function. The left ventricle demonstrates global hypokinesis. The left ventricular internal cavity size was normal in size. There is mild concentric left ventricular hypertrophy. Left ventricular diastolic function could not be evaluated due to atrial fibrillation. Left ventricular diastolic function could not be evaluated. Right Ventricle: The right ventricular size is mildly enlarged. Right vetricular wall thickness was not well visualized. Right ventricular systolic function is low normal. There is normal pulmonary artery systolic pressure. The tricuspid regurgitant velocity is 1.93 m/s, and with an assumed right atrial pressure of 8 mmHg, the estimated right ventricular systolic pressure is [id number] mmHg. Left Atrium: Left atrial size was mildly dilated. Right Atrium: Right atrial size was severely dilated. Pericardium: There is no evidence of pericardial effusion. Mitral Valve: The mitral valve is normal in structure. Trivial mitral valve regurgitation. Tricuspid Valve: The tricuspid valve  is abnormal. Tricuspid valve regurgitation is mild to moderate. Aortic Valve: The aortic valve is tricuspid. Aortic valve regurgitation is not visualized. Pulmonic Valve: The pulmonic valve was normal in structure. Pulmonic valve regurgitation is trivial. Aorta: Aortic dilatation noted. There is borderline dilatation of the aortic root, measuring 38 mm. Venous: The inferior vena cava is dilated in size with greater than 50% respiratory variability, suggesting right atrial pressure of 8 mmHg. IAS/Shunts: No atrial level shunt detected by color flow Doppler.  LEFT VENTRICLE PLAX 2D LVIDd:         4.20 cm LVIDs:         3.00 cm LV PW:         1.20 cm LV IVS:        1.20 cm  RIGHT VENTRICLE RV S  prime:     12.00 cm/s TAPSE (M-mode): 1.6 cm LEFT ATRIUM             Index        RIGHT ATRIUM           Index LA diam:        3.45 cm 1.31 cm/m   RA Area:     30.90 cm LA Vol (A2C):   84.8 ml 32.14 ml/m  RA Volume:   116.00 ml 43.96 ml/m LA Vol (A4C):   57.5 ml 21.79 ml/m LA Biplane Vol: 69.6 ml 26.38 ml/m  AORTIC VALVE LVOT Vmax:   88.20 cm/s LVOT Vmean:  57.700 cm/s LVOT VTI:    0.133 m  AORTA Ao Root diam: 3.80 cm Ao Asc diam:  3.10 cm TRICUSPID VALVE TR Peak grad:   14.9 mmHg TR Vmax:        193.00 cm/s  SHUNTS Systemic VTI: 0.13 m Dani Gobble Croitoru MD Electronically signed by Sanda Klein MD Signature Date/Time: 05/30/2022/4:24:33 PM    Final    DG Chest 2 View  Result Date: 05/29/2022 CLINICAL DATA:  Shortness of breath. EXAM: CHEST - 2 VIEW COMPARISON:  September 06, 2021. FINDINGS: Stable cardiomegaly. Minimal left basilar subsegmental atelectasis or scarring is noted. Right lung is clear. The visualized skeletal structures are unremarkable. IMPRESSION: Minimal left basilar subsegmental atelectasis or scarring. Electronically Signed   By: Marijo Conception M.D.   On: 05/29/2022 15:22    Microbiology: Recent Results (from the past 240 hour(s))  Resp Panel by RT-PCR (Flu Olayinka Gathers&B, Covid) Anterior Nasal Swab     Status: None   Collection Time: 05/29/22  6:57 PM   Specimen: Anterior Nasal Swab  Result Value Ref Range Status   SARS Coronavirus 2 by RT PCR NEGATIVE NEGATIVE Final    Comment: (NOTE) SARS-CoV-2 target nucleic acids are NOT DETECTED.  The SARS-CoV-2 RNA is generally detectable in upper respiratory specimens during the acute phase of infection. The lowest concentration of SARS-CoV-2 viral copies this assay can detect is 138 copies/mL. Nickalos Petersen negative result does not preclude SARS-Cov-2 infection and should not be used as the sole basis for treatment or other patient management decisions. Tashunda Vandezande negative result may occur with  improper specimen collection/handling, submission of specimen  other than nasopharyngeal swab, presence of viral mutation(s) within the areas targeted by this assay, and inadequate number of viral copies(<138 copies/mL). Etheline Geppert negative result must be combined with clinical observations, patient history, and epidemiological information. The expected result is Negative.  Fact Sheet for Patients:  EntrepreneurPulse.com.au  Fact Sheet for Healthcare Providers:  IncredibleEmployment.be  This test is no t yet approved or cleared by the [place]  States FDA and  has been authorized for detection and/or diagnosis of SARS-CoV-2 by FDA under an Emergency Use Authorization (EUA). This EUA will remain  in effect (meaning this test can be used) for the duration of the COVID-19 declaration under Section 564(b)(1) of the Act, 21 U.S.C.section 360bbb-3(b)(1), unless the authorization is terminated  or revoked sooner.       Influenza Rachella Basden by PCR NEGATIVE NEGATIVE Final   Influenza B by PCR NEGATIVE NEGATIVE Final    Comment: (NOTE) The Xpert Xpress SARS-CoV-2/FLU/RSV plus assay is intended as an aid in the diagnosis of influenza from Nasopharyngeal swab specimens and should not be used as Mischele Detter sole basis for treatment. Nasal washings and aspirates are unacceptable for Xpert Xpress SARS-CoV-2/FLU/RSV testing.  Fact Sheet for Patients: EntrepreneurPulse.com.au  Fact Sheet for Healthcare Providers: IncredibleEmployment.be  This test is not yet approved or cleared by the [place] FDA and has been authorized for detection and/or diagnosis of SARS-CoV-2 by FDA under an Emergency Use Authorization (EUA). This EUA will remain in effect (meaning this test can be used) for the duration of the COVID-19 declaration under Section 564(b)(1) of the Act, 21 U.S.C. section 360bbb-3(b)(1), unless the authorization is terminated or revoked.  Performed at [facility], [place] [address], [place],   [address]      Labs: Basic Metabolic Panel: Recent Labs  Lab 05/29/22 2313 05/31/22 0224 06/01/22 0217 06/02/22 0202 06/03/22 0939  NA 138 139 138 138 136  K 3.3* 3.4* 4.2 3.7 4.0  CL 101 98 100 99 102  CO2 26 28 24 26 24   GLUCOSE 132* 90 99 98 108*  BUN 10 16 16 15 12   CREATININE 1.26* 1.32* 1.36* 1.41* 1.32*  CALCIUM 8.9 9.1 9.1 8.9 8.7*  MG 2.0 1.9 2.4 2.2  --   PHOS 3.5  --  5.3* 5.0*  --    Liver Function Tests: Recent Labs  Lab 05/29/22 1450 06/01/22 0217 06/03/22 0939  AST 32 35 35  ALT 27 24 26   ALKPHOS 62 67 59  BILITOT 0.9 0.8 0.7  PROT 7.3 8.3* 7.7  ALBUMIN 3.3* 3.4* 3.1*   No results for input(s): "LIPASE", "AMYLASE" in the last 168 hours. No results for input(s): "AMMONIA" in the last 168 hours. CBC: Recent Labs  Lab 05/29/22 1450 05/29/22 2313 06/01/22 0217 06/02/22 0202 06/03/22 0939  WBC 6.1 6.3 6.2 5.6 5.1  NEUTROABS 2.9  --  2.6 2.2 2.0  HGB 15.1 16.1 17.0 16.3 17.1*  HCT 45.3 46.5 48.9 49.5 49.5  MCV 91.9 88.2 89.1 90.7 89.0  PLT 306 323 326 317 318   Cardiac Enzymes: No results for input(s): "CKTOTAL", "CKMB", "CKMBINDEX", "TROPONINI" in the last 168 hours. BNP: BNP (last 3 results) Recent Labs    05/29/22 1450 06/02/22 0201  BNP 234.6* 97.6    ProBNP (last 3 results) No results for input(s): "PROBNP" in the last 8760 hours.  CBG: No results for input(s): "GLUCAP" in the last 168 hours.     Signed:  Fayrene Helper MD.  [facility] 06/03/2022, 1:41 PM

## 2022-06-03 NOTE — TOC CM/SW Note (Signed)
Confirmed with nursing Mr. Sult received prescription medications from main pharmacy. No identifiable TOC needs.   Marthenia Rolling, MSN, RN,BSN Inpatient [facility] Case Manager [phone]

## 2022-06-03 NOTE — Progress Notes (Signed)
SATURATION QUALIFICATIONS: (This note is used to comply with regulatory documentation for home oxygen)  Patient Saturations on Room Air at Rest = 98%  Patient Saturations on Room Air while Ambulating = 91%  Patient Saturations on 0 Liters of oxygen while Ambulating = 91%  Please briefly explain why patient needs home oxygen:  Patient does not qualify for home O2. Patient's O2 was 91-94% on room air while ambulating and returned to normal without O2.

## 2022-06-04 ENCOUNTER — Encounter (HOSPITAL_COMMUNITY): Payer: Self-pay | Admitting: Cardiology

## 2022-06-06 ENCOUNTER — Telehealth: Payer: Self-pay

## 2022-06-06 ENCOUNTER — Other Ambulatory Visit (HOSPITAL_COMMUNITY): Payer: Self-pay

## 2022-06-06 ENCOUNTER — Telehealth (HOSPITAL_COMMUNITY): Payer: Self-pay

## 2022-06-06 NOTE — Telephone Encounter (Signed)
Transitions of Care Pharmacy   Call attempted for a pharmacy transitions of care follow-up. HIPAA appropriate voicemail was left with call back information provided.   Call attempt #1. Will follow-up in 2-3 days.   Tresa Jolley E. Marsh, PharmD PGY-1 Community Pharmacy Resident    

## 2022-06-06 NOTE — Telephone Encounter (Signed)
Transition Care Management Unsuccessful Follow-up Telephone Call  Date of discharge and from where:  06/03/2022  Attempts:  1st Attempt  Reason for unsuccessful TCM follow-up call:  Left voice message

## 2022-06-08 ENCOUNTER — Encounter (HOSPITAL_COMMUNITY): Payer: Self-pay | Admitting: Nurse Practitioner

## 2022-06-08 ENCOUNTER — Ambulatory Visit (HOSPITAL_COMMUNITY)
Admit: 2022-06-08 | Discharge: 2022-06-08 | Disposition: A | Discharge status: Home / Self Care | Payer: Commercial Managed Care - PPO | Source: Ambulatory Visit | Attending: Nurse Practitioner | Admitting: Nurse Practitioner

## 2022-06-08 ENCOUNTER — Telehealth: Payer: Self-pay | Admitting: Pharmacist

## 2022-06-08 VITALS — BP 120/96 | HR 83 | Ht 70.0 in | Wt 335.8 lb

## 2022-06-08 DIAGNOSIS — Z79899 Other long term (current) drug therapy: Secondary | ICD-10-CM | POA: Diagnosis not present

## 2022-06-08 DIAGNOSIS — Z7901 Long term (current) use of anticoagulants: Secondary | ICD-10-CM | POA: Diagnosis not present

## 2022-06-08 DIAGNOSIS — I10 Essential (primary) hypertension: Secondary | ICD-10-CM | POA: Diagnosis not present

## 2022-06-08 DIAGNOSIS — Z7984 Long term (current) use of oral hypoglycemic drugs: Secondary | ICD-10-CM | POA: Diagnosis not present

## 2022-06-08 DIAGNOSIS — D6869 Other thrombophilia: Secondary | ICD-10-CM

## 2022-06-08 DIAGNOSIS — I4891 Unspecified atrial fibrillation: Secondary | ICD-10-CM | POA: Diagnosis present

## 2022-06-08 LAB — MAGNESIUM: Magnesium: 2.2 mg/dL (ref 1.7–2.4)

## 2022-06-08 LAB — CBC
HCT: 53.1 % — ABNORMAL HIGH (ref 39.0–52.0)
Hemoglobin: 17.5 g/dL — ABNORMAL HIGH (ref 13.0–17.0)
MCH: 30.2 pg (ref 26.0–34.0)
MCHC: 33 g/dL (ref 30.0–36.0)
MCV: 91.7 fL (ref 80.0–100.0)
Platelets: 353 10*3/uL (ref 150–400)
RBC: 5.79 MIL/uL (ref 4.22–5.81)
RDW: 13.5 % (ref 11.5–15.5)
WBC: 5.1 10*3/uL (ref 4.0–10.5)
nRBC: 0 % (ref 0.0–0.2)

## 2022-06-08 LAB — COMPREHENSIVE METABOLIC PANEL
ALT: 39 U/L (ref 0–44)
AST: 41 U/L (ref 15–41)
Albumin: 3.3 g/dL — ABNORMAL LOW (ref 3.5–5.0)
Alkaline Phosphatase: 62 U/L (ref 38–126)
Anion gap: 6 (ref 5–15)
BUN: 13 mg/dL (ref 6–20)
CO2: 24 mmol/L (ref 22–32)
Calcium: 8.7 mg/dL — ABNORMAL LOW (ref 8.9–10.3)
Chloride: 106 mmol/L (ref 98–111)
Creatinine, Ser: 1.38 mg/dL — ABNORMAL HIGH (ref 0.61–1.24)
GFR, Estimated: >60 mL/min (ref >60)
Glucose, Bld: 104 mg/dL — ABNORMAL HIGH (ref 70–99)
Potassium: 4.1 mmol/L (ref 3.5–5.1)
Sodium: 136 mmol/L (ref 135–145)
Total Bilirubin: 0.5 mg/dL (ref 0.3–1.2)
Total Protein: 7.8 g/dL (ref 6.5–8.1)

## 2022-06-08 NOTE — Progress Notes (Signed)
Primary Care Physician: Minette Brine, FNP Referring Physician: [facility] f/u  Cardiologist: Dr. Janeann Merl Gurule is a 46 y.o. male with a h/o  morbid obesity, HTN, ran out of his medication about 3 months ago due to PCP leaving and closing his practice and patient having difficulty establishing care. Presented to [facility] ED with worsening bilateral lower extremity edema, dyspnea, chest tightness.  He was found to be in A-fib with RVR, significantly fluid overloaded and was admitted to the hospital.  He was placed on diltiazem infusion, Eliquis and cardiology was consulted. His weight is usually in the 330s, it was as high as 357 on admission.    He was found to have HFrEF exacerbation and new onset afib with RVR.  He had  TEE cardioversion, but remained in atrial fibrillation for just a few beats with each shock. TEE showed severely reduced EF at 30% with moderate to severe tricuspid regurgitation, rt atrium severely dilated, mild MR.  He was  improved after diuresis and rate control.  Plan was  for outpatient follow up with cardiology for further management.  He is being seen in afib clinic today. He remains in rate controlled afib. He reports feeling much better, not aware of afib at this point. . Weight is stable, no unusual shortness of breath. He noted symptoms for about one month prior to presenting to the ED. He has been told that he snores teribly with apnea noted. He was put on cpap in hospital. He does not smoke, sue drugs or drink alcohol. 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   He is being compliant with meds. No missed eliquis 5 mg bid, he has been on drug for around a week started 05/29/22 prior to TEE which was negative for thrombus.  Today, he denies symptoms of palpitations, chest pain, shortness of breath, orthopnea, PND, lower extremity edema, dizziness, presyncope, syncope, or neurologic sequela. The patient is  tolerating medications without difficulties and is otherwise without complaint today.   Past Medical History:  Diagnosis Date   Hypertension    Past Surgical History:  Procedure Laterality Date   CARDIOVERSION N/A 06/01/2022   Procedure: CARDIOVERSION;  Surgeon: Jerline Pain, MD;  Location: [facility] ENDOSCOPY;  Service: Cardiovascular;  Laterality: N/A;   CHOLECYSTECTOMY  2009   TEE WITHOUT CARDIOVERSION N/A 06/01/2022   Procedure: TRANSESOPHAGEAL ECHOCARDIOGRAM (TEE);  Surgeon: Jerline Pain, MD;  Location: [facility] ENDOSCOPY;  Service: Cardiovascular;  Laterality: N/A;    Current Outpatient Medications  Medication Sig Dispense Refill   albuterol (PROVENTIL HFA;VENTOLIN HFA) 108 (90 Base) MCG/ACT inhaler Inhale 2 puffs into the lungs every 4 (four) hours as needed for wheezing or shortness of breath. 1 Inhaler 0   apixaban (ELIQUIS) 5 MG TABS tablet Take 1 tablet (5 mg total) by mouth 2 (two) times daily. 60 tablet 1   carvedilol (COREG) 12.5 MG tablet Take 1 tablet (12.5 mg total) by mouth 2 (two) times daily with a meal. 60 tablet 1   dapagliflozin propanediol (FARXIGA) 10 MG TABS tablet Take 1 tablet (10 mg total) by mouth daily. 30 tablet 1   furosemide (LASIX) 40 MG tablet Take 1 tablet (40 mg total) by mouth 2 (two) times daily. 60 tablet 1   sacubitril-valsartan (ENTRESTO) 49-51 MG Take 1 tablet by mouth 2 (two) times daily. 60 tablet 1   spironolactone (ALDACTONE) 25 MG tablet Take 0.5 tablets (12.5 mg  total) by mouth daily. 15 tablet 1   traMADol (ULTRAM) 50 MG tablet Take 1 tablet (50 mg total) by mouth every 6 (six) hours as needed. 20 tablet 0   amLODipine (NORVASC) 10 MG tablet Take 10 mg by mouth daily. (Patient not taking: Reported on 06/08/2022)     No current facility-administered medications for this encounter.    No Known Allergies  Social History   Socioeconomic History   Marital status: Married    Spouse name: Izora Gala   Number of children: 4   Years of education: Not on  file   Highest education level: High school graduate  Occupational History   Occupation: Sports coach  Tobacco Use   Smoking status: Never   Smokeless tobacco: Never  Vaping Use   Vaping Use: Never used  Substance and Sexual Activity   Alcohol use: No   Drug use: No   Sexual activity: Yes    Partners: Female  Other Topics Concern   Not on file  Social History Narrative   Not on file   Social Determinants of Health   Financial Resource Strain: Low Risk  (06/01/2022)   Overall Financial Resource Strain (CARDIA)    Difficulty of Paying Living Expenses: Not hard at all  Food Insecurity: No Food Insecurity (06/01/2022)   Hunger Vital Sign    Worried About Running Out of Food in the Last Year: Never true    [place] in the Last Year: Never true  Transportation Needs: No Transportation Needs (06/01/2022)   PRAPARE - Hydrologist (Medical): No    Lack of Transportation (Non-Medical): No  Physical Activity: Not on file  Stress: Not on file  Social Connections: Not on file  Intimate Partner Violence: Not At Risk (05/29/2022)   Humiliation, Afraid, Rape, and Kick questionnaire    Fear of Current or Ex-Partner: No    Emotionally Abused: No    Physically Abused: No    Sexually Abused: No    Family History  Problem Relation Age of Onset   Cancer Mother    Stroke Neg Hx        None at early ages   CAD Neg Hx        None at early ages    4- All systems are reviewed and negative except as per the HPI above  Physical Exam: Vitals:   06/08/22 0858  BP: (!) 120/96  Pulse: 83  Weight: (!) 152.3 kg  Height: 5\' 10"  (1.778 m)   Wt Readings from Last 3 Encounters:  06/08/22 (!) 152.3 kg  06/03/22 (!) 153 kg  12/17/19 (!) 151 kg    Labs: Lab Results  Component Value Date   NA 136 06/08/2022   K 4.1 06/08/2022   CL 106 06/08/2022   CO2 24 06/08/2022   GLUCOSE 104 (H) 06/08/2022   BUN 13 06/08/2022   CREATININE 1.38 (H) 06/08/2022   CALCIUM  8.7 (L) 06/08/2022   PHOS 5.0 (H) 06/02/2022   MG 2.2 06/08/2022   Lab Results  Component Value Date   INR 0.97 04/02/2013   Lab Results  Component Value Date   CHOL 172 12/15/2019   HDL 31 (L) 12/15/2019   LDLCALC 122 (H) 12/15/2019   TRIG 105 12/15/2019     GEN- The patient is well appearing, alert and oriented x 3 today.   Head- normocephalic, atraumatic Eyes-  Sclera clear, conjunctiva pink Ears- hearing intact Oropharynx- clear Neck- supple, no JVP Lymph-  no cervical lymphadenopathy Lungs- Clear to ausculation bilaterally, normal work of breathing Heart- irregular rate and rhythm, no murmurs, rubs or gallops, PMI not laterally displaced GI- soft, NT, ND, + BS Extremities- no clubbing, cyanosis, or edema MS- no significant deformity or atrophy Skin- no rash or lesion Psych- euthymic mood, full affect Neuro- strength and sensation are intact  EKG-Vent. rate 83 BPM PR interval * ms QRS duration 72 ms QT/QTcB 360/423 ms P-R-T axes * 58 -31 Atrial fibrillation with a competing junctional pacemaker Nonspecific T wave abnormality Abnormal ECG When compared with ECG of 29-May-2022 14:23, PREVIOUS ECG IS PRESENT  Echo- Left ventricular ejection fraction, by estimation, is 35 to 40%. The left ventricle has moderately decreased function. The left ventricle demonstrates global hypokinesis. There is mild concentric left ventricular hypertrophy. Left ventricular diastolic function could not be evaluated. 1.Right ventricular systolic function is low normal. The right ventricular size is mildly enlarged. There is normal pulmonary artery systolic pressure. The estimated right ventricular systolic pressure is 58.0 mmHg. 2. 3. Left atrial size was mildly dilated. 4. Right atrial size was severely dilated. 5. The mitral valve is normal in structure. Trivial mitral valve regurgitation. 6. The tricuspid valve is abnormal. Tricuspid valve regurgitation is mild to moderate. 7.  The aortic valve is tricuspid. Aortic valve regurgitation is not visualized. Aortic dilatation noted. There is borderline dilatation of the aortic root, measuring 38 mm. 8. The inferior vena cava is dilated in size with >50% respiratory variability, suggesting right atrial pressure of 8 mmHg.  Assessment and Plan:  1. New onset afib  In the setting of HF, LV dysfunction At the present I do not think the pt is the best candidate for ablation due to morbid obesity I think it is very important that he does resume SR as he has LV dysfunction, thought to be tachy mediated from afib I discussed hospitalization for loading  Tikosyn to restore SR , risk vrs benefit and he is in agreement I will get scheduled first availability probably in 2 weeks  Continue carvedilol 12.5 mg bid for rate control.  No benadryl use Drugs to be screened  by pharmd Qt stable   2. CHA2DS2VASc  score of 3 Continue eliquis 5 mg bid   3. LV dysfunction Continue entresto, farxiga, lasix, spironolactone Wt stable   Limit salt and liquids  Cmet/cbc/mag today   4. Probable sleep apnea Witnessed snoring and apnea  Has appointment with Pulmonology 10/23  I will see back day of Tikosyn admit  Hopefully sleep study will be ordered then    [place]. Indica Marcott, [facility] [address] [place],  [id number] [phone]

## 2022-06-08 NOTE — Telephone Encounter (Signed)
Medication list reviewed in anticipation of upcoming Tikosyn initiation. Patient is not taking any contraindicated or QTc prolonging medications.   Patient is anticoagulated on Eliquis on the appropriate dose. Please ensure that patient has not missed any anticoagulation doses in the 3 weeks prior to Tikosyn initiation.   Patient will need to be counseled to avoid use of Benadryl while on Tikosyn and in the 2-3 days prior to Tikosyn initiation.  

## 2022-06-09 ENCOUNTER — Telehealth: Payer: Self-pay

## 2022-06-09 NOTE — Telephone Encounter (Signed)
Transition Care Management Follow-up Telephone Call Date of discharge and from where: 06/03/2022 [place]  How have you been since you were released from the hospital? Pt states he is doing better. He is taking medications as directed. Also keeping check of his weight.  Any questions or concerns? No  Items Reviewed: Did the pt receive and understand the discharge instructions provided? Yes  Medications obtained and verified? Yes  Other? Yes  Any new allergies since your discharge? No  Dietary orders reviewed? Yes Do you have support at home? Yes   Home Care and Equipment/Supplies: Were home health services ordered? no If so, what is the name of the agency? N/a  Has the agency set up a time to come to the patient's home? no Were any new equipment or medical supplies ordered?  No What is the name of the medical supply agency? N/a Were you able to get the supplies/equipment? no Do you have any questions related to the use of the equipment or supplies? No  Functional Questionnaire: (I = Independent and D = Dependent) ADLs: i  Bathing/Dressing- i  Meal Prep- i  Eating- i  Maintaining continence- i  Transferring/Ambulation- i  Managing Meds- i  Follow up appointments reviewed:  PCP Hospital f/u appt confirmed? Yes  Scheduled to see janece moore on n/a  @ n/a. [facility] f/u appt confirmed? Yes  Scheduled to see cardiologist  on n/a @ n/a. Are transportation arrangements needed? No  If their condition worsens, is the pt aware to call PCP or go to the Emergency Dept.? Yes Was the patient provided with contact information for the PCP's office or ED? Yes Was to pt encouraged to call back with questions or concerns? Yes

## 2022-06-12 ENCOUNTER — Encounter (HOSPITAL_COMMUNITY): Payer: Commercial Managed Care - PPO

## 2022-06-12 ENCOUNTER — Telehealth (HOSPITAL_COMMUNITY): Payer: Self-pay

## 2022-06-12 NOTE — Telephone Encounter (Signed)
Call attempted to confirm HV TOC appt today at [place] appropriate VM left with callback number.   Pricilla Holm, MSN, RN Heart Failure Nurse Navigator

## 2022-06-14 ENCOUNTER — Encounter: Payer: Self-pay | Admitting: Nurse Practitioner

## 2022-06-14 ENCOUNTER — Ambulatory Visit (INDEPENDENT_AMBULATORY_CARE_PROVIDER_SITE_OTHER): Payer: Commercial Managed Care - PPO | Admitting: Nurse Practitioner

## 2022-06-14 VITALS — BP 118/88 | HR 89 | Temp 98.4°F | Ht 70.0 in | Wt 336.0 lb

## 2022-06-14 DIAGNOSIS — Z8673 Personal history of transient ischemic attack (TIA), and cerebral infarction without residual deficits: Secondary | ICD-10-CM

## 2022-06-14 DIAGNOSIS — N289 Disorder of kidney and ureter, unspecified: Secondary | ICD-10-CM | POA: Diagnosis not present

## 2022-06-14 DIAGNOSIS — I4891 Unspecified atrial fibrillation: Secondary | ICD-10-CM

## 2022-06-14 DIAGNOSIS — E7800 Pure hypercholesterolemia, unspecified: Secondary | ICD-10-CM

## 2022-06-14 DIAGNOSIS — I5021 Acute systolic (congestive) heart failure: Secondary | ICD-10-CM | POA: Diagnosis not present

## 2022-06-14 DIAGNOSIS — Z6841 Body Mass Index (BMI) 40.0 and over, adult: Secondary | ICD-10-CM

## 2022-06-14 DIAGNOSIS — Z1211 Encounter for screening for malignant neoplasm of colon: Secondary | ICD-10-CM

## 2022-06-14 LAB — LIPID PANEL
Chol/HDL Ratio: 6.1 ratio — ABNORMAL HIGH (ref 0.0–5.0)
Cholesterol, Total: 188 mg/dL (ref 100–199)
HDL: 31 mg/dL — ABNORMAL LOW (ref >39)
LDL Chol Calc (NIH): 121 mg/dL — ABNORMAL HIGH (ref 0–99)
Triglycerides: 203 mg/dL — ABNORMAL HIGH (ref 0–149)
VLDL Cholesterol Cal: 36 mg/dL (ref 5–40)

## 2022-06-14 LAB — HEMOGLOBIN A1C
Est. average glucose Bld gHb Est-mCnc: 128 mg/dL
Hgb A1c MFr Bld: 6.1 % — ABNORMAL HIGH (ref 4.8–5.6)

## 2022-06-14 NOTE — Progress Notes (Signed)
I,Anthony Delgado,acting as a Education administrator for [employer], FNP.,have documented all relevant documentation on the behalf of Anthony Brine, FNP,as directed by  Anthony Brine, FNP while in the presence of Anthony Delgado, [place].  Subjective:     Patient ID: Anthony Delgado , male    DOB: 1976/03/24 , 46 y.o.   MRN: [id number]   Chief Complaint  Patient presents with   Hospitalization Follow-up    HPI  Patient presents today for a hospitalization follow-up. He had been having increase in shortness of breath and significant swelling to his lower extremities after working one night.  He was found to have atrial fibrillation with RVR with significant fluid overload and was admitted to the hospital. He was placed on diltiazem infusion, Eliquis and cardiology was consulted. He tells me his weight is usually in the 330s, it was as high as 350s when he saw his PCP for new visit.He was also found to have HFrEF exacerbation. He is s/p TEE cardioversion, but continued to remain in atrial fibrillation. He had improvement after diuresis and rate control. He was started on entresto, coreg, spironolactone, farxiga -> repeat echo 3-6 months, needs ischemic evaluation if LV remains decreased. He is to have cardioversion for the atrial fib with RVR. He has to be admitted to [facility] next week for a procedure for 4 days. He seen the Cardiologist on Monday. He is to see a lung specialist on Monday.   He is requesting a refill on albuterol. Denies a history of asthma. He had not been to [place] in 2 years. Prior to this he had seen another PCP due to feeling bad one day earlier in the year but he is no longer in practice.   He is not back at work he is currently out until October 31st. He has his FMLA forms to be completed.      Past Medical History:  Diagnosis Date   Hypertension      Family History  Problem Relation Age of Onset   Cancer Mother    Stroke Neg Hx        None at early ages   CAD Neg Hx        None at early ages      Current Outpatient Medications:    albuterol (PROVENTIL HFA;VENTOLIN HFA) 108 (90 Base) MCG/ACT inhaler, Inhale 2 puffs into the lungs every 4 (four) hours as needed for wheezing or shortness of breath., Disp: 1 Inhaler, Rfl: 0   apixaban (ELIQUIS) 5 MG TABS tablet, Take 1 tablet (5 mg total) by mouth 2 (two) times daily., Disp: 60 tablet, Rfl: 1   carvedilol (COREG) 6.25 MG tablet, Take 1 tablet (6.25 mg total) by mouth 2 (two) times daily with a meal., Disp: 60 tablet, Rfl: 11   dapagliflozin propanediol (FARXIGA) 10 MG TABS tablet, Take 1 tablet (10 mg total) by mouth daily., Disp: 30 tablet, Rfl: 1   dofetilide (TIKOSYN) 500 MCG capsule, Take 1 capsule (500 mcg total) by mouth 2 (two) times daily., Disp: 60 capsule, Rfl: 11   furosemide (LASIX) 40 MG tablet, Take 1 tablet (40 mg total) by mouth 2 (two) times daily., Disp: 60 tablet, Rfl: 1   sacubitril-valsartan (ENTRESTO) 49-51 MG, Take 1 tablet by mouth 2 (two) times daily., Disp: 60 tablet, Rfl: 1   spironolactone (ALDACTONE) 25 MG tablet, Take 0.5 tablets (12.5 mg total) by mouth daily., Disp: 15 tablet, Rfl: 1   traMADol (ULTRAM) 50 MG tablet, Take 1 tablet (50 mg total)  by mouth every 6 (six) hours as needed. (Patient taking differently: Take 50 mg by mouth daily as needed for moderate pain.), Disp: 20 tablet, Rfl: 0   No Known Allergies   Review of Systems  Constitutional: Negative.   Respiratory: Negative.    Cardiovascular: Negative.   Gastrointestinal: Negative.   Neurological: Negative.      Today's Vitals   06/14/22 0911  BP: 118/88  Pulse: 89  Temp: 98.4 F (36.9 C)  TempSrc: Oral  Weight: (!) 336 lb (152.4 kg)  Height: 5\' 10"  (1.778 m)   Body mass index is 48.21 kg/m.  Wt Readings from Last 3 Encounters:  06/28/22 (!) 333 lb 9.6 oz (151.3 kg)  06/22/22 (!) 334 lb 7 oz (151.7 kg)  06/20/22 (!) 334 lb 6.4 oz (151.7 kg)    Objective:  Physical Exam Vitals reviewed.  Constitutional:      General: He is  not in acute distress.    Appearance: Normal appearance. He is obese.  Cardiovascular:     Rate and Rhythm: Normal rate and regular rhythm.     Pulses: Normal pulses.     Heart sounds: Normal heart sounds. No murmur heard. Pulmonary:     Effort: Pulmonary effort is normal. No respiratory distress.     Breath sounds: Normal breath sounds. No wheezing.  Musculoskeletal:        General: No swelling or tenderness.     Right lower leg: Edema (trace) present.     Left lower leg: Edema (trace) present.  Skin:    General: Skin is warm and dry.     Capillary Refill: Capillary refill takes less than 2 seconds.  Neurological:     General: No focal deficit present.     Mental Status: He is alert and oriented to person, place, and time.     Cranial Nerves: No cranial nerve deficit.     Motor: No weakness.  Psychiatric:        Mood and Affect: Mood normal.        Behavior: Behavior normal.        Thought Content: Thought content normal.        Judgment: Judgment normal.         Assessment And Plan:     1. Atrial fibrillation with RVR ([place]) Comments: He is to have a cardioversion next week. Continue Eliquis  2. Acute systolic congestive heart failure ([place]) Comments: He is feeling better with his medication regimen, continue f/u with Cardiology  3. Renal insufficiency Comments: His creatinine was up to 1.34, encouraged to stay hydrated within his alotted amount of liquids.   4. Elevated LDL cholesterol level Comments: Will recheck levels he may need to start a statin.  - Lipid panel  5. History of TIAs  6. Class 3 severe obesity due to excess calories with serious comorbidity and body mass index (BMI) of 45.0 to 49.9 in adult [facility]) She is encouraged to strive for BMI less than 30 to decrease cardiac risk. Advised to aim for at least 150 minutes of exercise per week. - Hemoglobin A1c  7. Colon cancer screening According to USPTF Colorectal cancer Screening guidelines. Colonoscopy  is recommended every 3 years, starting at age 17 years. Will refer to GI for colon cancer screening. - Cologuard    Patient was given opportunity to ask questions. Patient verbalized understanding of the plan and was able to repeat key elements of the plan. All questions were answered to their satisfaction.  Doreene Burke  Laurance Flatten, FNP   I, Anthony Brine, FNP, have reviewed all documentation for this visit. The documentation on 06/14/22 for the exam, diagnosis, procedures, and orders are all accurate and complete.   IF YOU HAVE BEEN REFERRED TO A SPECIALIST, IT MAY TAKE 1-2 WEEKS TO SCHEDULE/PROCESS THE REFERRAL. IF YOU HAVE NOT HEARD FROM US/SPECIALIST IN TWO WEEKS, PLEASE GIVE [place] A CALL AT [phone].   THE PATIENT IS ENCOURAGED TO PRACTICE SOCIAL DISTANCING DUE TO THE COVID-19 PANDEMIC.

## 2022-06-14 NOTE — Patient Instructions (Signed)
Atrial Fibrillation  Atrial fibrillation is a type of heartbeat that is irregular or fast. If you have this condition, your heart beats without any order. This makes it hard for your heart to pump blood in a normal way. Atrial fibrillation may come and go, or it may become a long-lasting problem. If this condition is not treated, it can put you at higher risk for stroke, heart failure, and other heart problems. What are the causes? This condition may be caused by diseases that damage the heart. They include: High blood pressure. Heart failure. Heart valve disease. Heart surgery. Other causes include: Diabetes. Thyroid disease. Being overweight. Kidney disease. Sometimes the cause is not known. What increases the risk? You are more likely to develop this condition if: You are older. You smoke. You exercise often and very hard. You have a family history of this condition. You are a man. You use drugs. You drink a lot of alcohol. You have lung conditions, such as emphysema, pneumonia, or COPD. You have sleep apnea. What are the signs or symptoms? Common symptoms of this condition include: A feeling that your heart is beating very fast. Chest pain or discomfort. Feeling short of breath. Suddenly feeling light-headed or weak. Getting tired easily during activity. Fainting. Sweating. In some cases, there are no symptoms. How is this treated? Treatment for this condition depends on underlying conditions and how you feel when you have atrial fibrillation. They include: Medicines to: Prevent blood clots. Treat heart rate or heart rhythm problems. Using devices, such as a pacemaker, to correct heart rhythm problems. Doing surgery to remove the part of the heart that sends bad signals. Closing an area where clots can form in the heart (left atrial appendage). In some cases, your doctor will treat other underlying conditions. Follow these instructions at home: Medicines Take  over-the-counter and prescription medicines only as told by your doctor. Do not take any new medicines without first talking to your doctor. If you are taking blood thinners: Talk with your doctor before you take any medicines that have aspirin or NSAIDs, such as ibuprofen, in them. Take your medicine exactly as told by your doctor. Take it at the same time each day. Avoid activities that could hurt or bruise you. Follow instructions about how to prevent falls. Wear a bracelet that says you are taking blood thinners. Or, carry a card that lists what medicines you take. Lifestyle     Do not use any products that have nicotine or tobacco in them. These include cigarettes, e-cigarettes, and chewing tobacco. If you need help quitting, ask your doctor. Eat heart-healthy foods. Talk with your doctor about the right eating plan for you. Exercise regularly as told by your doctor. Do not drink alcohol. Lose weight if you are overweight. Do not use drugs, including cannabis. General instructions If you have a condition that causes breathing to stop for a short period of time (apnea), treat it as told by your doctor. Keep a healthy weight. Do not use diet pills unless your doctor says they are safe for you. Diet pills may make heart problems worse. Keep all follow-up visits as told by your doctor. This is important. Contact a doctor if: You notice a change in the speed, rhythm, or strength of your heartbeat. You are taking a blood-thinning medicine and you get more bruising. You get tired more easily when you move or exercise. You have a sudden change in weight. Get help right away if:  You have pain in   your chest or your belly (abdomen). You have trouble breathing. You have side effects of blood thinners, such as blood in your vomit, poop (stool), or pee (urine), or bleeding that cannot stop. You have any signs of a stroke. "BE FAST" is an easy way to remember the main warning signs: B -  Balance. Signs are dizziness, sudden trouble walking, or loss of balance. E - Eyes. Signs are trouble seeing or a change in how you see. F - Face. Signs are sudden weakness or loss of feeling in the face, or the face or eyelid drooping on one side. A - Arms. Signs are weakness or loss of feeling in an arm. This happens suddenly and usually on one side of the body. S - Speech. Signs are sudden trouble speaking, slurred speech, or trouble understanding what people say. T - Time. Time to call emergency services. Write down what time symptoms started. You have other signs of a stroke, such as: A sudden, very bad headache with no known cause. Feeling like you may vomit (nausea). Vomiting. A seizure. These symptoms may be an emergency. Do not wait to see if the symptoms will go away. Get medical help right away. Call your local emergency services (911 in the [place] Do not drive yourself to the hospital. Summary Atrial fibrillation is a type of heartbeat that is irregular or fast. You are at higher risk of this condition if you smoke, are older, have diabetes, or are overweight. Follow your doctor's instructions about medicines, diet, exercise, and follow-up visits. Get help right away if you have signs or symptoms of a stroke. Get help right away if you cannot catch your breath, or you have chest pain or discomfort. This information is not intended to replace advice given to you by your health care provider. Make sure you discuss any questions you have with your health care provider. Document Revised: 02/05/2019 Document Reviewed: 02/05/2019 Elsevier Patient Education  2023 Elsevier Inc.  

## 2022-06-16 ENCOUNTER — Encounter (HOSPITAL_COMMUNITY): Payer: Self-pay

## 2022-06-19 ENCOUNTER — Other Ambulatory Visit (HOSPITAL_COMMUNITY): Payer: Self-pay

## 2022-06-19 ENCOUNTER — Institutional Professional Consult (permissible substitution): Payer: Commercial Managed Care - PPO | Admitting: Pulmonary Disease

## 2022-06-19 NOTE — Progress Notes (Incomplete)
Synopsis: Referred in *** for *** by Elodia Florence.,*  Subjective:   PATIENT ID: Anthony Delgado GENDER: male DOB: 1976/01/25, MRN: [id number]  No chief complaint on file.   HPI  ***  Past Medical History:  Diagnosis Date  . Hypertension      Family History  Problem Relation Age of Onset  . Cancer Mother   . Stroke Neg Hx        None at early ages  . CAD Neg Hx        None at early ages     Past Surgical History:  Procedure Laterality Date  . CARDIOVERSION N/A 06/01/2022   Procedure: CARDIOVERSION;  Surgeon: Jerline Pain, MD;  Location[facility] ENDOSCOPY;  Service: Cardiovascular;  Laterality: N/A;  . CHOLECYSTECTOMY  2009  . TEE WITHOUT CARDIOVERSION N/A 06/01/2022   Procedure: TRANSESOPHAGEAL ECHOCARDIOGRAM (TEE);  Surgeon: Jerline Pain, MD;  Location[facility] ENDOSCOPY;  Service: Cardiovascular;  Laterality: N/A;    Social History   Socioeconomic History  . Marital status: Married    Spouse name: Izora Gala  . Number of children: 4  . Years of education: Not on file  . Highest education level: High school graduate  Occupational History  . Occupation: [employer]  Tobacco Use  . Smoking status: Never  . Smokeless tobacco: Never  Vaping Use  . Vaping Use: Never used  Substance and Sexual Activity  . Alcohol use: No  . Drug use: No  . Sexual activity: Yes    Partners: Female  Other Topics Concern  . Not on file  Social History Narrative  . Not on file   Social Determinants of Health   Financial Resource Strain: Low Risk  (06/01/2022)   Overall Financial Resource Strain (CARDIA)   . Difficulty of Paying Living Expenses: Not hard at all  Food Insecurity: No Food Insecurity (06/01/2022)   Hunger Vital Sign   . Worried About Charity fundraiser in the Last Year: Never true   . Ran Out of Food in the Last Year: Never true  Transportation Needs: No Transportation Needs (06/01/2022)   PRAPARE - Transportation   . Lack of Transportation (Medical): No   . Lack of  Transportation (Non-Medical): No  Physical Activity: Not on file  Stress: Not on file  Social Connections: Not on file  Intimate Partner Violence: Not At Risk (05/29/2022)   Humiliation, Afraid, Rape, and Kick questionnaire   . Fear of Current or Ex-Partner: No   . Emotionally Abused: No   . Physically Abused: No   . Sexually Abused: No     No Known Allergies   Outpatient Medications Prior to Visit  Medication Sig Dispense Refill  . albuterol (PROVENTIL HFA;VENTOLIN HFA) 108 (90 Base) MCG/ACT inhaler Inhale 2 puffs into the lungs every 4 (four) hours as needed for wheezing or shortness of breath. (Patient not taking: Reported on 06/09/2022) 1 Inhaler 0  . apixaban (ELIQUIS) 5 MG TABS tablet Take 1 tablet (5 mg total) by mouth 2 (two) times daily. 60 tablet 1  . carvedilol (COREG) 12.5 MG tablet Take 1 tablet (12.5 mg total) by mouth 2 (two) times daily with a meal. 60 tablet 1  . dapagliflozin propanediol (FARXIGA) 10 MG TABS tablet Take 1 tablet (10 mg total) by mouth daily. 30 tablet 1  . furosemide (LASIX) 40 MG tablet Take 1 tablet (40 mg total) by mouth 2 (two) times daily. 60 tablet 1  . sacubitril-valsartan (ENTRESTO) 49-51 MG Take 1  tablet by mouth 2 (two) times daily. 60 tablet 1  . spironolactone (ALDACTONE) 25 MG tablet Take 0.5 tablets (12.5 mg total) by mouth daily. 15 tablet 1  . traMADol (ULTRAM) 50 MG tablet Take 1 tablet (50 mg total) by mouth every 6 (six) hours as needed. 20 tablet 0   No facility-administered medications prior to visit.    ROS   Objective:  Physical Exam   There were no vitals filed for this visit.   on *** LPM *** RA BMI Readings from Last 3 Encounters:  06/14/22 48.21 kg/m  06/08/22 48.18 kg/m  06/03/22 48.38 kg/m   Wt Readings from Last 3 Encounters:  06/14/22 (!) 336 lb (152.4 kg)  06/08/22 (!) 335 lb 12.8 oz (152.3 kg)  06/03/22 (!) 337 lb 3.2 oz (153 kg)     CBC    Component Value Date/Time   WBC 5.1 06/08/2022 1015    RBC 5.79 06/08/2022 1015   HGB 17.5 (H) 06/08/2022 1015   HGB 17.0 12/15/2019 1710   HCT 53.1 (H) 06/08/2022 1015   HCT 48.5 12/15/2019 1710   PLT 353 06/08/2022 1015   PLT 254 12/15/2019 1710   MCV 91.7 06/08/2022 1015   MCV 89 12/15/2019 1710   MCH 30.2 06/08/2022 1015   MCHC 33.0 06/08/2022 1015   RDW 13.5 06/08/2022 1015   RDW 13.9 12/15/2019 1710   LYMPHSABS 2.3 06/03/2022 0939   LYMPHSABS 2.3 12/15/2019 1710   MONOABS 0.4 06/03/2022 0939   EOSABS 0.3 06/03/2022 0939   EOSABS 0.1 12/15/2019 1710   BASOSABS 0.1 06/03/2022 0939   BASOSABS 0.0 12/15/2019 1710    ***  Chest Imaging: ***  Pulmonary Functions Testing Results:     No data to display          FeNO: ***  Pathology: ***  Echocardiogram: ***  Heart Catheterization: ***    Assessment & Plan:   No diagnosis found.  Discussion: ***   Current Outpatient Medications:  .  albuterol (PROVENTIL HFA;VENTOLIN HFA) 108 (90 Base) MCG/ACT inhaler, Inhale 2 puffs into the lungs every 4 (four) hours as needed for wheezing or shortness of breath. (Patient not taking: Reported on 06/09/2022), Disp: 1 Inhaler, Rfl: 0 .  apixaban (ELIQUIS) 5 MG TABS tablet, Take 1 tablet (5 mg total) by mouth 2 (two) times daily., Disp: 60 tablet, Rfl: 1 .  carvedilol (COREG) 12.5 MG tablet, Take 1 tablet (12.5 mg total) by mouth 2 (two) times daily with a meal., Disp: 60 tablet, Rfl: 1 .  dapagliflozin propanediol (FARXIGA) 10 MG TABS tablet, Take 1 tablet (10 mg total) by mouth daily., Disp: 30 tablet, Rfl: 1 .  furosemide (LASIX) 40 MG tablet, Take 1 tablet (40 mg total) by mouth 2 (two) times daily., Disp: 60 tablet, Rfl: 1 .  sacubitril-valsartan (ENTRESTO) 49-51 MG, Take 1 tablet by mouth 2 (two) times daily., Disp: 60 tablet, Rfl: 1 .  spironolactone (ALDACTONE) 25 MG tablet, Take 0.5 tablets (12.5 mg total) by mouth daily., Disp: 15 tablet, Rfl: 1 .  traMADol (ULTRAM) 50 MG tablet, Take 1 tablet (50 mg total) by mouth every  6 (six) hours as needed., Disp: 20 tablet, Rfl: 0  I spent *** minutes dedicated to the care of this patient on the date of this encounter to include pre-visit review of records, face-to-face time with the patient discussing conditions above, post visit ordering of testing, clinical documentation with the electronic health record, making appropriate referrals as documented, and communicating necessary  findings to members of the patients care team.   Garner Nash, DO [facility] 06/19/2022 8:21 AM

## 2022-06-19 NOTE — Progress Notes (Incomplete)
HEART & VASCULAR TRANSITION OF CARE CONSULT NOTE     Referring Physician: Primary Care: Primary Cardiologist:  HPI: Referred to clinic by *** for heart failure consultation. 46 y.o. male with history of morbid obesity, medication noncompliance, HTN.  Presented 05/29/22 with acute CHF and afib with RVR. Had not been taking medications at home and blood pressure was uncontrolled. Troponin mildly elevated with flat trend, thought to be d/t demand ischemia rather than ACS. Cardiology consulted. Echo ***. Underwent TEE/attempted DCCV (unsuccessful after 3 attempts). He was referred to afib clinic at discharge to discuss options for rhythm control. GDMT titrated.   He saw afib clinic for f/u 10/12. Remained in afib. Admission for Tikosyn loading recommended.   Had f/u with pulmonary today for OSA but he cancelled the appointment.  Here today for hospital follow-up.     Cardiac Testing    Review of Systems: [y] = yes, [ ]  = no   General: Weight gain [ ] ; Weight loss [ ] ; Anorexia [ ] ; Fatigue [ ] ; Fever [ ] ; Chills [ ] ; Weakness [ ]   Cardiac: Chest pain/pressure [ ] ; Resting SOB [ ] ; Exertional SOB [ ] ; Orthopnea [ ] ; Pedal Edema [ ] ; Palpitations [ ] ; Syncope [ ] ; Presyncope [ ] ; Paroxysmal nocturnal dyspnea[ ]   Pulmonary: Cough [ ] ; Wheezing[ ] ; Hemoptysis[ ] ; Sputum [ ] ; Snoring [ ]   GI: Vomiting[ ] ; Dysphagia[ ] ; Melena[ ] ; Hematochezia [ ] ; Heartburn[ ] ; Abdominal pain [ ] ; Constipation [ ] ; Diarrhea [ ] ; BRBPR [ ]   GU: Hematuria[ ] ; Dysuria [ ] ; Nocturia[ ]   Vascular: Pain in legs with walking [ ] ; Pain in feet with lying flat [ ] ; Non-healing sores [ ] ; Stroke [ ] ; TIA [ ] ; Slurred speech [ ] ;  Neuro: Headaches[ ] ; Vertigo[ ] ; Seizures[ ] ; Paresthesias[ ] ;Blurred vision [ ] ; Diplopia [ ] ; Vision changes [ ]   Ortho/Skin: Arthritis [ ] ; Joint pain [ ] ; Muscle pain [ ] ; Joint swelling [ ] ; Back Pain [ ] ; Rash [ ]   Psych: Depression[ ] ; Anxiety[ ]   Heme: Bleeding problems [ ] ;  Clotting disorders [ ] ; Anemia [ ]   Endocrine: Diabetes [ ] ; Thyroid dysfunction[ ]    Past Medical History:  Diagnosis Date   Hypertension     Current Outpatient Medications  Medication Sig Dispense Refill   albuterol (PROVENTIL HFA;VENTOLIN HFA) 108 (90 Base) MCG/ACT inhaler Inhale 2 puffs into the lungs every 4 (four) hours as needed for wheezing or shortness of breath. (Patient not taking: Reported on 06/09/2022) 1 Inhaler 0   apixaban (ELIQUIS) 5 MG TABS tablet Take 1 tablet (5 mg total) by mouth 2 (two) times daily. 60 tablet 1   carvedilol (COREG) 12.5 MG tablet Take 1 tablet (12.5 mg total) by mouth 2 (two) times daily with a meal. 60 tablet 1   dapagliflozin propanediol (FARXIGA) 10 MG TABS tablet Take 1 tablet (10 mg total) by mouth daily. 30 tablet 1   furosemide (LASIX) 40 MG tablet Take 1 tablet (40 mg total) by mouth 2 (two) times daily. 60 tablet 1   sacubitril-valsartan (ENTRESTO) 49-51 MG Take 1 tablet by mouth 2 (two) times daily. 60 tablet 1   spironolactone (ALDACTONE) 25 MG tablet Take 0.5 tablets (12.5 mg total) by mouth daily. 15 tablet 1   traMADol (ULTRAM) 50 MG tablet Take 1 tablet (50 mg total) by mouth every 6 (six) hours as needed. 20 tablet 0   No current facility-administered medications for this visit.  No Known Allergies    Social History   Socioeconomic History   Marital status: Married    Spouse name: Izora Gala   Number of children: 4   Years of education: Not on file   Highest education level: High school graduate  Occupational History   Occupation: Sports coach  Tobacco Use   Smoking status: Never   Smokeless tobacco: Never  Vaping Use   Vaping Use: Never used  Substance and Sexual Activity   Alcohol use: No   Drug use: No   Sexual activity: Yes    Partners: Female  Other Topics Concern   Not on file  Social History Narrative   Not on file   Social Determinants of Health   Financial Resource Strain: Low Risk  (06/01/2022)   Overall  Financial Resource Strain (CARDIA)    Difficulty of Paying Living Expenses: Not hard at all  Food Insecurity: No Food Insecurity (06/01/2022)   Hunger Vital Sign    Worried About Running Out of Food in the Last Year: Never true    [place] in the Last Year: Never true  Transportation Needs: No Transportation Needs (06/01/2022)   PRAPARE - Hydrologist (Medical): No    Lack of Transportation (Non-Medical): No  Physical Activity: Not on file  Stress: Not on file  Social Connections: Not on file  Intimate Partner Violence: Not At Risk (05/29/2022)   Humiliation, Afraid, Rape, and Kick questionnaire    Fear of Current or Ex-Partner: No    Emotionally Abused: No    Physically Abused: No    Sexually Abused: No      Family History  Problem Relation Age of Onset   Cancer Mother    Stroke Neg Hx        None at early ages   CAD Neg Hx        None at early ages    There were no vitals filed for this visit.  PHYSICAL EXAM: General:  Well appearing. No respiratory difficulty HEENT: normal Neck: supple. no JVD. Carotids 2+ bilat; no bruits. No lymphadenopathy or thryomegaly appreciated. Cor: PMI nondisplaced. Regular rate & rhythm. No rubs, gallops or murmurs. Lungs: clear Abdomen: soft, nontender, nondistended. No hepatosplenomegaly. No bruits or masses. Good bowel sounds. Extremities: no cyanosis, clubbing, rash, edema Neuro: alert & oriented x 3, cranial nerves grossly intact. moves all 4 extremities w/o difficulty. Affect pleasant.  ECG:   ASSESSMENT & PLAN:  HFrEF -Suspect tachymediated cardiomyopathy in setting of new Afib with RVR. If EF does not improve with GDMT and restoration of SR will need ischemic workup -Echo *** -NYHA *** -Volume  Atrial fibrillation  OSA  HTN  Morbid obesity  Noncompliance with medical therapy   NYHA *** GDMT  Diuretic- BB- Ace/ARB/ARNI MRA SGLT2i    Referred to HFSW (PCP, Medications,  Transportation, ETOH Abuse, Drug Abuse, Insurance, Financial ): Yes or No Refer to Pharmacy: Yes or No Refer to Home Health: Yes on No Refer to Advanced Heart Failure Clinic: Yes or no  Refer to General Cardiology: Yes or No  Follow up

## 2022-06-20 ENCOUNTER — Encounter (HOSPITAL_COMMUNITY): Payer: Self-pay | Admitting: Cardiology

## 2022-06-20 ENCOUNTER — Encounter (HOSPITAL_COMMUNITY): Payer: Commercial Managed Care - PPO

## 2022-06-20 ENCOUNTER — Ambulatory Visit (HOSPITAL_COMMUNITY)
Admission: RE | Admit: 2022-06-20 | Discharge: 2022-06-20 | Disposition: A | Discharge status: Home / Self Care | Payer: Commercial Managed Care - PPO | Source: Ambulatory Visit | Attending: Nurse Practitioner | Admitting: Nurse Practitioner

## 2022-06-20 ENCOUNTER — Telehealth (HOSPITAL_COMMUNITY): Payer: Self-pay | Admitting: *Deleted

## 2022-06-20 ENCOUNTER — Other Ambulatory Visit: Payer: Self-pay

## 2022-06-20 ENCOUNTER — Inpatient Hospital Stay (HOSPITAL_COMMUNITY)
Admission: AD | Admit: 2022-06-20 | Discharge: 2022-06-23 | DRG: 309 | Disposition: A | Discharge status: Home / Self Care | Payer: Commercial Managed Care - PPO | Source: Ambulatory Visit | Attending: Cardiology | Admitting: Cardiology

## 2022-06-20 VITALS — BP 120/84 | HR 82 | Ht 70.0 in | Wt 334.4 lb

## 2022-06-20 DIAGNOSIS — Z8673 Personal history of transient ischemic attack (TIA), and cerebral infarction without residual deficits: Secondary | ICD-10-CM | POA: Diagnosis not present

## 2022-06-20 DIAGNOSIS — I4891 Unspecified atrial fibrillation: Secondary | ICD-10-CM | POA: Diagnosis not present

## 2022-06-20 DIAGNOSIS — I5022 Chronic systolic (congestive) heart failure: Secondary | ICD-10-CM | POA: Diagnosis present

## 2022-06-20 DIAGNOSIS — I110 Hypertensive heart disease with heart failure: Secondary | ICD-10-CM | POA: Diagnosis present

## 2022-06-20 DIAGNOSIS — I4819 Other persistent atrial fibrillation: Principal | ICD-10-CM | POA: Diagnosis present

## 2022-06-20 DIAGNOSIS — Z7901 Long term (current) use of anticoagulants: Secondary | ICD-10-CM

## 2022-06-20 DIAGNOSIS — Z79899 Other long term (current) drug therapy: Secondary | ICD-10-CM | POA: Diagnosis not present

## 2022-06-20 DIAGNOSIS — Z9049 Acquired absence of other specified parts of digestive tract: Secondary | ICD-10-CM | POA: Diagnosis not present

## 2022-06-20 DIAGNOSIS — I071 Rheumatic tricuspid insufficiency: Secondary | ICD-10-CM | POA: Diagnosis present

## 2022-06-20 DIAGNOSIS — Z6841 Body Mass Index (BMI) 40.0 and over, adult: Secondary | ICD-10-CM

## 2022-06-20 DIAGNOSIS — Z7984 Long term (current) use of oral hypoglycemic drugs: Secondary | ICD-10-CM

## 2022-06-20 DIAGNOSIS — I10 Essential (primary) hypertension: Secondary | ICD-10-CM | POA: Diagnosis not present

## 2022-06-20 DIAGNOSIS — G4730 Sleep apnea, unspecified: Secondary | ICD-10-CM | POA: Diagnosis present

## 2022-06-20 DIAGNOSIS — D6869 Other thrombophilia: Secondary | ICD-10-CM

## 2022-06-20 DIAGNOSIS — I4892 Unspecified atrial flutter: Secondary | ICD-10-CM | POA: Diagnosis not present

## 2022-06-20 LAB — BASIC METABOLIC PANEL
Anion gap: 9 (ref 5–15)
BUN: 8 mg/dL (ref 6–20)
CO2: 26 mmol/L (ref 22–32)
Calcium: 8.6 mg/dL — ABNORMAL LOW (ref 8.9–10.3)
Chloride: 101 mmol/L (ref 98–111)
Creatinine, Ser: 1.46 mg/dL — ABNORMAL HIGH (ref 0.61–1.24)
GFR, Estimated: 59 mL/min — ABNORMAL LOW (ref >60)
Glucose, Bld: 101 mg/dL — ABNORMAL HIGH (ref 70–99)
Potassium: 3.8 mmol/L (ref 3.5–5.1)
Sodium: 136 mmol/L (ref 135–145)

## 2022-06-20 LAB — MAGNESIUM: Magnesium: 2.2 mg/dL (ref 1.7–2.4)

## 2022-06-20 MED ORDER — FUROSEMIDE 40 MG PO TABS
40.0000 mg | ORAL_TABLET | Freq: Two times a day (BID) | ORAL | Status: DC
  Administered 2022-06-20 – 2022-06-23 (×6): 40 mg via ORAL
  Filled 2022-06-20 (×6): qty 1

## 2022-06-20 MED ORDER — SACUBITRIL-VALSARTAN 49-51 MG PO TABS
1.0000 | ORAL_TABLET | Freq: Two times a day (BID) | ORAL | Status: DC
  Administered 2022-06-20 – 2022-06-23 (×6): 1 via ORAL
  Filled 2022-06-20 (×8): qty 1

## 2022-06-20 MED ORDER — DAPAGLIFLOZIN PROPANEDIOL 10 MG PO TABS
10.0000 mg | ORAL_TABLET | Freq: Every day | ORAL | Status: DC
  Administered 2022-06-21 – 2022-06-23 (×3): 10 mg via ORAL
  Filled 2022-06-20 (×3): qty 1

## 2022-06-20 MED ORDER — SODIUM CHLORIDE 0.9% FLUSH
3.0000 mL | Freq: Two times a day (BID) | INTRAVENOUS | Status: DC
  Administered 2022-06-20 – 2022-06-23 (×6): 3 mL via INTRAVENOUS

## 2022-06-20 MED ORDER — SODIUM CHLORIDE 0.9 % IV SOLN: 250.0000 mL | INTRAVENOUS | Status: DC | PRN

## 2022-06-20 MED ORDER — CARVEDILOL 12.5 MG PO TABS
12.5000 mg | ORAL_TABLET | Freq: Two times a day (BID) | ORAL | Status: DC
  Administered 2022-06-20: 12.5 mg via ORAL
  Filled 2022-06-20: qty 1

## 2022-06-20 MED ORDER — APIXABAN 5 MG PO TABS
5.0000 mg | ORAL_TABLET | Freq: Two times a day (BID) | ORAL | Status: DC
  Administered 2022-06-20 – 2022-06-23 (×6): 5 mg via ORAL
  Filled 2022-06-20 (×6): qty 1

## 2022-06-20 MED ORDER — DOFETILIDE 500 MCG PO CAPS
500.0000 ug | ORAL_CAPSULE | Freq: Two times a day (BID) | ORAL | Status: DC
  Administered 2022-06-20 – 2022-06-23 (×6): 500 ug via ORAL
  Filled 2022-06-20 (×6): qty 1

## 2022-06-20 MED ORDER — SPIRONOLACTONE 12.5 MG HALF TABLET
12.5000 mg | ORAL_TABLET | Freq: Every day | ORAL | Status: DC
  Administered 2022-06-21 – 2022-06-23 (×3): 12.5 mg via ORAL
  Filled 2022-06-20 (×3): qty 1

## 2022-06-20 MED ORDER — SODIUM CHLORIDE 0.9% FLUSH: 3.0000 mL | INTRAVENOUS | Status: DC | PRN

## 2022-06-20 MED ORDER — POTASSIUM CHLORIDE CRYS ER 20 MEQ PO TBCR
40.0000 meq | EXTENDED_RELEASE_TABLET | Freq: Once | ORAL | Status: AC
  Administered 2022-06-20: 40 meq via ORAL
  Filled 2022-06-20: qty 2

## 2022-06-20 NOTE — Progress Notes (Signed)
EKG after 1st dose of Tikosyn had a QTC of 436.

## 2022-06-20 NOTE — H&P (Signed)
Primary Care Physician: Minette Brine, FNP Referring Physician: [facility] f/u  Cardiologist: Dr. Janeann Merl Buchler is a 46 y.o. male with a h/o  morbid obesity, HTN, ran out of his medication about 3 months ago due to PCP leaving and closing his practice and patient having difficulty establishing care. Presented to [facility] ED with worsening bilateral lower extremity edema, dyspnea, chest tightness.  He was found to be in A-fib with RVR, significantly fluid overloaded and was admitted to the hospital.  He was placed on diltiazem infusion, Eliquis and cardiology was consulted. His weight is usually in the 330s, it was as high as 357 on admission.    He was found to have HFrEF exacerbation and new onset afib with RVR.  He had  TEE cardioversion, but remained in atrial fibrillation for just a few beats with each shock. TEE showed severely reduced EF at 30% with moderate to severe tricuspid regurgitation, rt atrium severely dilated, mild MR.  He was  improved after diuresis and rate control.  Plan was  for outpatient follow up with cardiology for further management.  He is being seen in afib clinic today. He remains in rate controlled afib. He reports feeling much better, not aware of afib at this point. . Weight is stable, no unusual shortness of breath. He noted symptoms for about one month prior to presenting to the ED. He has been told that he snores teribly with apnea noted. He was put on cpap in hospital. He does not smoke, sue drugs or drink alcohol. 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   He is being compliant with meds. No missed eliquis 5 mg bid, he has been on drug for around a week started 05/29/22 prior to TEE which was negative for thrombus.  F/u in afib clinic, 06/20/22. He is here for Tikosyn admit. He remains in rate controlled afib. Normovolemic. No missed anticoagulation or benadryl  use. His needs were reviewed by PharmD and  no contraindicated drugs on board. Qtc  is acceptable for tikosyn use.  Today, he denies symptoms of palpitations, chest pain, shortness of breath, orthopnea, PND, lower extremity edema, dizziness, presyncope, syncope, or neurologic sequela. The patient is tolerating medications without difficulties and is otherwise without complaint today.   Past Medical History:  Diagnosis Date   Hypertension    Past Surgical History:  Procedure Laterality Date   CARDIOVERSION N/A 06/01/2022   Procedure: CARDIOVERSION;  Surgeon: Jerline Pain, MD;  Location: [facility] ENDOSCOPY;  Service: Cardiovascular;  Laterality: N/A;   CHOLECYSTECTOMY  2009   TEE WITHOUT CARDIOVERSION N/A 06/01/2022   Procedure: TRANSESOPHAGEAL ECHOCARDIOGRAM (TEE);  Surgeon: Jerline Pain, MD;  Location: [facility] ENDOSCOPY;  Service: Cardiovascular;  Laterality: N/A;    Current Facility-Administered Medications  Medication Dose Route Frequency Provider Last Rate Last Admin   0.9 %  sodium chloride infusion  250 mL Intravenous PRN Sherran Needs, NP       apixaban Arne Cleveland) tablet 5 mg  5 mg Oral BID Sherran Needs, NP       carvedilol (COREG) tablet 12.5 mg  12.5 mg Oral BID WC Sherran Needs, NP       [START ON 06/21/2022] dapagliflozin propanediol (FARXIGA) tablet 10 mg  10 mg Oral Daily Sherran Needs, NP       dofetilide (TIKOSYN) capsule 500 mcg  500 mcg Oral BID Sherran Needs, NP  furosemide (LASIX) tablet 40 mg  40 mg Oral BID Sherran Needs, NP       sacubitril-valsartan (ENTRESTO) 49-51 mg per tablet  1 tablet Oral BID Sherran Needs, NP       sodium chloride flush (NS) 0.9 % injection 3 mL  3 mL Intravenous Q12H Sherran Needs, NP       sodium chloride flush (NS) 0.9 % injection 3 mL  3 mL Intravenous PRN Sherran Needs, NP       [START ON 06/21/2022] spironolactone (ALDACTONE) tablet 12.5 mg  12.5 mg Oral Daily Sherran Needs, NP        No Known Allergies  Social History   Socioeconomic History    Marital status: Married    Spouse name: Izora Gala   Number of children: 4   Years of education: Not on file   Highest education level: High school graduate  Occupational History   Occupation: Sports coach  Tobacco Use   Smoking status: Never   Smokeless tobacco: Never  Vaping Use   Vaping Use: Never used  Substance and Sexual Activity   Alcohol use: No   Drug use: No   Sexual activity: Yes    Partners: Female  Other Topics Concern   Not on file  Social History Narrative   Not on file   Social Determinants of Health   Financial Resource Strain: Low Risk  (06/01/2022)   Overall Financial Resource Strain (CARDIA)    Difficulty of Paying Living Expenses: Not hard at all  Food Insecurity: No Food Insecurity (06/20/2022)   Hunger Vital Sign    Worried About Running Out of Food in the Last Year: Never true    Ran Out of Food in the Last Year: Never true  Transportation Needs: No Transportation Needs (06/20/2022)   PRAPARE - Hydrologist (Medical): No    Lack of Transportation (Non-Medical): No  Physical Activity: Not on file  Stress: Not on file  Social Connections: Not on file  Intimate Partner Violence: Not At Risk (06/20/2022)   Humiliation, Afraid, Rape, and Kick questionnaire    Fear of Current or Ex-Partner: No    Emotionally Abused: No    Physically Abused: No    Sexually Abused: No    Family History  Problem Relation Age of Onset   Cancer Mother    Stroke Neg Hx        None at early ages   CAD Neg Hx        None at early ages    23- All systems are reviewed and negative except as per the HPI above  Physical Exam: Vitals:   06/20/22 1335  BP: 92/73  Pulse: 73  Resp: 17  Temp: 98 F (36.7 C)  TempSrc: Oral   Wt Readings from Last 3 Encounters:  06/20/22 (!) 151.7 kg  06/14/22 (!) 152.4 kg  06/08/22 (!) 152.3 kg    Labs: Lab Results  Component Value Date   NA 136 06/20/2022   K 3.8 06/20/2022   CL 101 06/20/2022   CO2 26  06/20/2022   GLUCOSE 101 (H) 06/20/2022   BUN 8 06/20/2022   CREATININE 1.46 (H) 06/20/2022   CALCIUM 8.6 (L) 06/20/2022   PHOS 5.0 (H) 06/02/2022   MG 2.2 06/20/2022   Lab Results  Component Value Date   INR 0.97 04/02/2013   Lab Results  Component Value Date   CHOL 188 06/14/2022   HDL 31 (L)  06/14/2022   LDLCALC 121 (H) 06/14/2022   TRIG 203 (H) 06/14/2022     GEN- The patient is well appearing, alert and oriented x 3 today.   Head- normocephalic, atraumatic Eyes-  Sclera clear, conjunctiva pink Ears- hearing intact Oropharynx- clear Neck- supple, no JVP Lymph- no cervical lymphadenopathy Lungs- Clear to ausculation bilaterally, normal work of breathing Heart- irregular rate and rhythm, no murmurs, rubs or gallops, PMI not laterally displaced GI- soft, NT, ND, + BS Extremities- no clubbing, cyanosis, or edema MS- no significant deformity or atrophy Skin- no rash or lesion Psych- euthymic mood, full affect Neuro- strength and sensation are intact  EKG- Vent. rate 82 BPM PR interval * ms QRS duration 74 ms QT/QTcB 364/425 ms P-R-T axes * 64 -27 Atrial fibrillation Nonspecific T wave abnormality Abnormal ECG When compared with ECG of 08-Jun-2022 09:21, PREVIOUS ECG IS PRESENT  Echo- Left ventricular ejection fraction, by estimation, is 35 to 40%. The left ventricle has moderately decreased function. The left ventricle demonstrates global hypokinesis. There is mild concentric left ventricular hypertrophy. Left ventricular diastolic function could not be evaluated. 1.Right ventricular systolic function is low normal. The right ventricular size is mildly enlarged. There is normal pulmonary artery systolic pressure. The estimated right ventricular systolic pressure is [id number] mmHg. 2. 3. Left atrial size was mildly dilated. 4. Right atrial size was severely dilated. 5. The mitral valve is normal in structure. Trivial mitral valve regurgitation. 6. The tricuspid  valve is abnormal. Tricuspid valve regurgitation is mild to moderate. 7. The aortic valve is tricuspid. Aortic valve regurgitation is not visualized. Aortic dilatation noted. There is borderline dilatation of the aortic root, measuring 38 mm. 8. The inferior vena cava is dilated in size with >50% respiratory variability, suggesting right atrial pressure of 8 mmHg.  Assessment and Plan:  1. New onset afib  In the setting of HF, LV dysfunction At the present I do not think the pt is the best candidate for ablation due to morbid obesity I think it is very important that he does resume SR as he has LV dysfunction, thought to be tachy mediated from afib He is now here for Tikosyn admit, general precautions re Tikosyn and hospital routine for such  Continue carvedilol 12.5 mg bid for rate control.  No benadryl use Drugs  screened  by pharmD and no contraindicated drugs  Qt stable  Bmet/mag  2. CHA2DS2VASc  score of 3 Continue eliquis 5 mg bid  States no missed doses   3. LV dysfunction Continue entresto, farxiga, lasix, spironolactone Wt stable   Limit salt and liquids     4. Probable sleep apnea Witnessed snoring and apnea  ? If sleep study ordered, was suppose to see pulmonology yesterday   To 6E 19 today when bed is available  Butch Penny C. [facility] [address] [place], [place] [address] [phone]   I have seen and examined this patient with Roderic Palau.  Agree with above, note added to reflect my findings.  With a history of atrial fibrillation and heart failure.  Presents to the hospital for dofetilide load.  Feeling well.  GEN: Well nourished, well developed, in no acute distress  HEENT: normal  Neck: no JVD, carotid bruits, or masses Cardiac: Irregular; no murmurs, rubs, or gallops,no edema  Respiratory:  clear to auscultation bilaterally, normal work of breathing GI: soft, nontender, nondistended, + BS MS: no deformity or  atrophy  Skin: warm and dry\ Neuro:  Strength and sensation are intact Psych: euthymic mood, full affect   Persistent atrial fibrillation: CHA2DS2-VASc of 3.  Currently on Eliquis 5 mg twice daily.  Admitted for dofetilide load.  We Kassim Guertin start with 500 mcg twice daily tonight.  Keep K greater than 4, magnesium greater than 2. Probable sleep apnea: Outpatient sleep study pending LV dysfunction: We Fate Galanti continue home heart failure medications.  No obvious volume overload.  Ashleigh Luckow M. [place] MD 06/20/2022 5:56 PM

## 2022-06-20 NOTE — Progress Notes (Signed)
Pharmacy: Dofetilide (Tikosyn) - Initial Consult Assessment and Electrolyte Replacement  Pharmacy consulted to assist in monitoring and replacing electrolytes in this 46 y.o. male admitted on 06/20/2022 undergoing dofetilide initiation. First dofetilide dose: 500 mcg bid to start 10/24 at 8 pm.  Assessment:  Patient Exclusion Criteria: If any screening criteria checked as "Yes", then  patient  should NOT receive dofetilide until criteria item is corrected.  If "Yes" please indicate correction plan.  YES  NO Patient  Exclusion Criteria Correction Plan   []   [x]   Baseline QTc interval is greater than or equal to 440 msec. IF above YES box checked dofetilide contraindicated unless patient has ICD; then may proceed if QTc 500-550 msec or with known ventricular conduction abnormalities may proceed with QTc 550-600 msec. QTc =      []   [x]   Patient is known or suspected to have a digoxin level greater than 2 ng/ml: No results found for: "DIGOXIN"     []   [x]   Creatinine clearance less than 20 ml/min (calculated using Cockcroft-Gault, actual body weight and serum creatinine): Estimated Creatinine Clearance: 91.5 mL/min (A) (by C-G formula based on SCr of 1.46 mg/dL (H)).     []   [x]  Patient has received drugs known to prolong the QT intervals within the last 48 hours (phenothiazines, tricyclics or tetracyclic antidepressants, erythromycin, H-1 antihistamines, cisapride, fluoroquinolones, azithromycin, ondansetron).   Updated information on QT prolonging agents is available to be searched on the following database:QT prolonging agents     []   [x]   Patient received a dose of hydrochlorothiazide (Oretic) alone or in any combination including triamterene (Dyazide, Maxzide) in the last 48 hours.    []   [x]  Patient received a medication known to increase dofetilide plasma concentrations prior to initial dofetilide dose:  Trimethoprim (Primsol, Proloprim) in the last 36 hours Verapamil  (Calan, Verelan) in the last 36 hours or a sustained release dose in the last 72 hours Megestrol (Megace) in the last 5 days  Cimetidine (Tagamet) in the last 6 hours Ketoconazole (Nizoral) in the last 24 hours Itraconazole (Sporanox) in the last 48 hours  Prochlorperazine (Compazine) in the last 36 hours     []   [x]   Patient is known to have a history of torsades de pointes; congenital or acquired long QT syndromes.    []   [x]   Patient has received a Class 1 antiarrhythmic with less than 2 half-lives since last dose. (Disopyramide, Quinidine, Procainamide, Lidocaine, Mexiletine, Flecainide, Propafenone)    []   [x]   Patient has received amiodarone therapy in the past 3 months or amiodarone level is greater than 0.3 ng/ml.    Labs:    Component Value Date/Time   K 3.8 06/20/2022 1000   MG 2.2 06/20/2022 1000     Plan: [facility] One Calculated CrCl  Dose q12h  [x]  > 60 ml/min 500 mcg  []  40-60 ml/min 250 mcg  []  20-40 ml/min 125 mcg   [x]   Physician selected initial dose within range recommended for patients level of renal function - will monitor for response.  []   Physician selected initial dose outside of range recommended for patients level of renal function - will discuss if the dose should be altered at this time.   Patient has been appropriately anticoagulated with Eliquis.  Potassium: K 3.8-3.9:  Hold Tikosyn initiation and give KCl 40 mEq po x1 then begin Tikosyn at least 2hr after KCl dose - do not need to recheck K   Magnesium: Mg >2: Appropriate to  initiate Tikosyn, no replacement needed     Thank you for allowing pharmacy to participate in this patient's care  [place] Crane, Roylene Reason, [facility] Clinical Pharmacist  06/20/2022 1:37 PM   [facility] pharmacy phone numbers are listed on [email or web address]

## 2022-06-20 NOTE — Progress Notes (Signed)
Primary Care Physician: [name], FNP Referring Physician: [facility], [place] f/u  Cardiologist: Dr. Damaris Schooner [place] is a 46 y.o. male with a h/o  morbid obesity, HTN, ran out of his medication about 3 months ago due to PCP leaving and closing his practice and patient having difficulty establishing care. Presented to [facility] ED with worsening bilateral lower extremity edema, dyspnea, chest tightness.  He was found to be in A-fib with RVR, significantly fluid overloaded and was admitted to the hospital.  He was placed on diltiazem infusion, Eliquis and cardiology was consulted. His weight is usually in the 330s, it was as high as 357 on admission.    He was found to have HFrEF exacerbation and new onset afib with RVR.  He had  TEE cardioversion, but remained in atrial fibrillation for just a few beats with each shock. TEE showed severely reduced EF at 30% with moderate to severe tricuspid regurgitation, rt atrium severely dilated, mild MR.  He was  improved after diuresis and rate control.  Plan was  for outpatient follow up with cardiology for further management.  He is being seen in afib clinic today. He remains in rate controlled afib. He reports feeling much better, not aware of afib at this point. . Weight is stable, no unusual shortness of breath. He noted symptoms for about one month prior to presenting to the ED. He has been told that he snores teribly with apnea noted. He was put on cpap in hospital. He does not smoke, sue drugs or drink alcohol. 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   He is being compliant with meds. No missed eliquis 5 mg bid, he has been on drug for around a week started 05/29/22 prior to TEE which was negative for thrombus.  F/u in afib clinic, 06/20/22. He is here for Tikosyn admit. He remains in rate controlled afib. Normovolemic. No missed anticoagulation or benadryl  use. His needs were reviewed by PharmD and  no contraindicated drugs on board. Qtc  is acceptable for tikosyn use.  Today, he denies symptoms of palpitations, chest pain, shortness of breath, orthopnea, PND, lower extremity edema, dizziness, presyncope, syncope, or neurologic sequela. The patient is tolerating medications without difficulties and is otherwise without complaint today.   Past Medical History:  Diagnosis Date   Hypertension    Past Surgical History:  Procedure Laterality Date   CARDIOVERSION N/A 06/01/2022   Procedure: CARDIOVERSION;  Surgeon: Jake Bathe, MD;  Location: [facility] ENDOSCOPY;  Service: Cardiovascular;  Laterality: N/A;   CHOLECYSTECTOMY  2009   TEE WITHOUT CARDIOVERSION N/A 06/01/2022   Procedure: TRANSESOPHAGEAL ECHOCARDIOGRAM (TEE);  Surgeon: Jake Bathe, MD;  Location: [facility] ENDOSCOPY;  Service: Cardiovascular;  Laterality: N/A;    Current Outpatient Medications  Medication Sig Dispense Refill   albuterol (PROVENTIL HFA;VENTOLIN HFA) 108 (90 Base) MCG/ACT inhaler Inhale 2 puffs into the lungs every 4 (four) hours as needed for wheezing or shortness of breath. 1 Inhaler 0   apixaban (ELIQUIS) 5 MG TABS tablet Take 1 tablet (5 mg total) by mouth 2 (two) times daily. 60 tablet 1   carvedilol (COREG) 12.5 MG tablet Take 1 tablet (12.5 mg total) by mouth 2 (two) times daily with a meal. 60 tablet 1   dapagliflozin propanediol (FARXIGA) 10 MG TABS tablet Take 1 tablet (10 mg total) by mouth daily. 30 tablet 1   furosemide (LASIX) 40 MG tablet  Take 1 tablet (40 mg total) by mouth 2 (two) times daily. 60 tablet 1   sacubitril-valsartan (ENTRESTO) 49-51 MG Take 1 tablet by mouth 2 (two) times daily. 60 tablet 1   spironolactone (ALDACTONE) 25 MG tablet Take 0.5 tablets (12.5 mg total) by mouth daily. 15 tablet 1   traMADol (ULTRAM) 50 MG tablet Take 1 tablet (50 mg total) by mouth every 6 (six) hours as needed. (Patient taking differently: Take 50 mg by mouth as needed.) 20 tablet 0   No current  facility-administered medications for this encounter.    No Known Allergies  Social History   Socioeconomic History   Marital status: Married    Spouse name: Izora Gala   Number of children: 4   Years of education: Not on file   Highest education level: High school graduate  Occupational History   Occupation: Sports coach  Tobacco Use   Smoking status: Never   Smokeless tobacco: Never  Vaping Use   Vaping Use: Never used  Substance and Sexual Activity   Alcohol use: No   Drug use: No   Sexual activity: Yes    Partners: Female  Other Topics Concern   Not on file  Social History Narrative   Not on file   Social Determinants of Health   Financial Resource Strain: Low Risk  (06/01/2022)   Overall Financial Resource Strain (CARDIA)    Difficulty of Paying Living Expenses: Not hard at all  Food Insecurity: No Food Insecurity (06/01/2022)   Hunger Vital Sign    Worried About Running Out of Food in the Last Year: Never true    [place] in the Last Year: Never true  Transportation Needs: No Transportation Needs (06/01/2022)   PRAPARE - Hydrologist (Medical): No    Lack of Transportation (Non-Medical): No  Physical Activity: Not on file  Stress: Not on file  Social Connections: Not on file  Intimate Partner Violence: Not At Risk (05/29/2022)   Humiliation, Afraid, Rape, and Kick questionnaire    Fear of Current or Ex-Partner: No    Emotionally Abused: No    Physically Abused: No    Sexually Abused: No    Family History  Problem Relation Age of Onset   Cancer Mother    Stroke Neg Hx        None at early ages   CAD Neg Hx        None at early ages    43- All systems are reviewed and negative except as per the HPI above  Physical Exam: Vitals:   06/20/22 1000  BP: 120/84  Pulse: 82  Weight: (!) 151.7 kg  Height: 5\' 10"  (1.778 m)   Wt Readings from Last 3 Encounters:  06/20/22 (!) 151.7 kg  06/14/22 (!) 152.4 kg  06/08/22 (!) 152.3 kg     Labs: Lab Results  Component Value Date   NA 136 06/08/2022   K 4.1 06/08/2022   CL 106 06/08/2022   CO2 24 06/08/2022   GLUCOSE 104 (H) 06/08/2022   BUN 13 06/08/2022   CREATININE 1.38 (H) 06/08/2022   CALCIUM 8.7 (L) 06/08/2022   PHOS 5.0 (H) 06/02/2022   MG 2.2 06/08/2022   Lab Results  Component Value Date   INR 0.97 04/02/2013   Lab Results  Component Value Date   CHOL 188 06/14/2022   HDL 31 (L) 06/14/2022   LDLCALC 121 (H) 06/14/2022   TRIG 203 (H) 06/14/2022  GEN- The patient is well appearing, alert and oriented x 3 today.   Head- normocephalic, atraumatic Eyes-  Sclera clear, conjunctiva pink Ears- hearing intact Oropharynx- clear Neck- supple, no JVP Lymph- no cervical lymphadenopathy Lungs- Clear to ausculation bilaterally, normal work of breathing Heart- irregular rate and rhythm, no murmurs, rubs or gallops, PMI not laterally displaced GI- soft, NT, ND, + BS Extremities- no clubbing, cyanosis, or edema MS- no significant deformity or atrophy Skin- no rash or lesion Psych- euthymic mood, full affect Neuro- strength and sensation are intact  EKG- Vent. rate 82 BPM PR interval * ms QRS duration 74 ms QT/QTcB 364/425 ms P-R-T axes * 64 -27 Atrial fibrillation Nonspecific T wave abnormality Abnormal ECG When compared with ECG of 08-Jun-2022 09:21, PREVIOUS ECG IS PRESENT  Echo- Left ventricular ejection fraction, by estimation, is 35 to 40%. The left ventricle has moderately decreased function. The left ventricle demonstrates global hypokinesis. There is mild concentric left ventricular hypertrophy. Left ventricular diastolic function could not be evaluated. 1.Right ventricular systolic function is low normal. The right ventricular size is mildly enlarged. There is normal pulmonary artery systolic pressure. The estimated right ventricular systolic pressure is [id number] mmHg. 2. 3. Left atrial size was mildly dilated. 4. Right atrial size was  severely dilated. 5. The mitral valve is normal in structure. Trivial mitral valve regurgitation. 6. The tricuspid valve is abnormal. Tricuspid valve regurgitation is mild to moderate. 7. The aortic valve is tricuspid. Aortic valve regurgitation is not visualized. Aortic dilatation noted. There is borderline dilatation of the aortic root, measuring 38 mm. 8. The inferior vena cava is dilated in size with >50% respiratory variability, suggesting right atrial pressure of 8 mmHg.  Assessment and Plan:  1. New onset afib  In the setting of HF, LV dysfunction At the present I do not think the pt is the best candidate for ablation due to morbid obesity I think it is very important that he does resume SR as he has LV dysfunction, thought to be tachy mediated from afib He is now here for Tikosyn admit, general precautions re Tikosyn and hospital routine for such  Continue carvedilol 12.5 mg bid for rate control.  No benadryl use Drugs  screened  by pharmD and no contraindicated drugs  Qt stable  Bmet/mag  2. CHA2DS2VASc  score of 3 Continue eliquis 5 mg bid  States no missed doses   3. LV dysfunction Continue entresto, farxiga, lasix, spironolactone Wt stable   Limit salt and liquids     4. Probable sleep apnea Witnessed snoring and apnea  ? If sleep study ordered, was suppose to see pulmonology yesterday   To 6E 19 today when bed is available  Butch Penny C. Hailey Stormer, [facility] [address] [place], [place] [address] [phone]

## 2022-06-20 NOTE — Telephone Encounter (Signed)
Heart Failure Nurse Navigator Progress Note   Patient HF TOC appointment cancelled due to pending admission for cardioversion.   Anthony Delgado, BSN, Clinical cytogeneticist Only

## 2022-06-21 ENCOUNTER — Telehealth (HOSPITAL_COMMUNITY): Payer: Self-pay | Admitting: Pharmacy Technician

## 2022-06-21 ENCOUNTER — Other Ambulatory Visit (HOSPITAL_COMMUNITY): Payer: Self-pay

## 2022-06-21 DIAGNOSIS — I4819 Other persistent atrial fibrillation: Secondary | ICD-10-CM | POA: Diagnosis not present

## 2022-06-21 LAB — BASIC METABOLIC PANEL
Anion gap: 8 (ref 5–15)
BUN: 11 mg/dL (ref 6–20)
CO2: 25 mmol/L (ref 22–32)
Calcium: 9.1 mg/dL (ref 8.9–10.3)
Chloride: 104 mmol/L (ref 98–111)
Creatinine, Ser: 1.23 mg/dL (ref 0.61–1.24)
GFR, Estimated: >60 mL/min (ref >60)
Glucose, Bld: 85 mg/dL (ref 70–99)
Potassium: 4.1 mmol/L (ref 3.5–5.1)
Sodium: 137 mmol/L (ref 135–145)

## 2022-06-21 LAB — MAGNESIUM: Magnesium: 2 mg/dL (ref 1.7–2.4)

## 2022-06-21 MED ORDER — CARVEDILOL 6.25 MG PO TABS
6.2500 mg | ORAL_TABLET | Freq: Two times a day (BID) | ORAL | Status: DC
  Administered 2022-06-21 – 2022-06-23 (×5): 6.25 mg via ORAL
  Filled 2022-06-21 (×5): qty 1

## 2022-06-21 MED ORDER — MAGNESIUM SULFATE 2 GM/50ML IV SOLN
2.0000 g | Freq: Once | INTRAVENOUS | Status: AC
  Administered 2022-06-21: 2 g via INTRAVENOUS
  Filled 2022-06-21: qty 50

## 2022-06-21 MED ORDER — SODIUM CHLORIDE 0.9 % IV SOLN: INTRAVENOUS | Status: DC

## 2022-06-21 NOTE — Care Management (Signed)
  Transition of Care [facility]) Screening Note   Patient Details  Name: Anthony Delgado Date of Birth: 07/16/1976   Transition of Care [facility]) CM/SW Contact:    Bethena Roys, RN Phone Number: 06/21/2022, 2:54 PM    Transition of Care Department [facility]) has reviewed the patient. Patient presented for Tikosyn Load. Benefits check submitted and Case Manager discussed cost with the patient. Patient would like his initial Rx filled via [facility] and the Rx refills 90 day supply to be escribed to [facility]. No further needs identified at this time.

## 2022-06-21 NOTE — Telephone Encounter (Signed)
Patient Advocate Encounter   Received notification that prior authorization for Dofetilide 500MCG capsules is required.   PA submitted on 06/21/2022 Key [id number] Status is pending       Lyndel Safe, CPhT Pharmacy Patient Advocate Specialist [place] Patient Advocate Team Direct Number: [phone]  Fax: [phone]

## 2022-06-21 NOTE — Progress Notes (Addendum)
Pharmacy: Dofetilide (Tikosyn) - Follow Up Assessment and Electrolyte Replacement  Pharmacy consulted to assist in monitoring and replacing electrolytes in this 46 y.o. male admitted on 06/20/2022 undergoing dofetilide initiation. First dofetilide dose: 06/20/22.  Labs:    Component Value Date/Time   K 4.1 06/21/2022 0524   MG 2.0 06/21/2022 0524     Plan: Potassium: K >/= 4: No additional supplementation needed  Magnesium: Mg 1.8-2: Give Mg 2 gm IV x1    No potassium supplementation need this morning. Will follow along for discharge recommendations.   Thank you for allowing pharmacy to participate in this patient's care   Erin Hearing PharmD., BCPS Clinical Pharmacist 06/21/2022 7:46 AM

## 2022-06-21 NOTE — Telephone Encounter (Signed)
Patient Advocate Encounter  Prior Authorization for Dofetilide 500MCG capsules  has been approved.    PA# [id number] Effective dates: 06/21/2022 through 06/22/2023  Patients co-pay is $0.00.     Lyndel Safe, [place] Patient Advocate Specialist [place] Patient Advocate Team Direct Number: [phone]  Fax: [phone]

## 2022-06-21 NOTE — H&P (View-Only) (Signed)
Rounding Note    Patient Name: Anthony Delgado Date of Encounter: 06/21/2022  [place] Cardiologist: Anthony Ruths, MD   Subjective   No c/o, slept well  Inpatient Medications    Scheduled Meds:  apixaban  5 mg Oral BID   carvedilol  12.5 mg Oral BID WC   dapagliflozin propanediol  10 mg Oral Daily   dofetilide  500 mcg Oral BID   furosemide  40 mg Oral BID   sacubitril-valsartan  1 tablet Oral BID   sodium chloride flush  3 mL Intravenous Q12H   spironolactone  12.5 mg Oral Daily   Continuous Infusions:  sodium chloride     PRN Meds: sodium chloride, sodium chloride flush   Vital Signs    Vitals:   06/20/22 1335 06/20/22 2014 06/20/22 2308 06/21/22 0424  BP: 92/73 (!) 154/81 (!) 130/100 111/70  Pulse: 73 77 81 81  Resp: 17     Temp: 98 F (36.7 C) 98.3 F (36.8 C)  98.3 F (36.8 C)  TempSrc: Oral Oral  Oral  SpO2:  98%  98%   No intake or output data in the 24 hours ending 06/21/22 0726    06/20/2022   10:00 AM 06/14/2022    9:11 AM 06/08/2022    8:58 AM  Last 3 Weights  Weight (lbs) 334 lb 6.4 oz 336 lb 335 lb 12.8 oz  Weight (kg) 151.683 kg 152.409 kg 152.318 kg      Telemetry    AFib 60's mostly, overnight/early AM pauses mostly 3-4 seconds or so, once 9 seconds- Personally Reviewed  ECG    AFib 76bpm, QTc 415ms - Personally Reviewed  Physical Exam   GEN: No acute distress.   Neck: No JVD Cardiac: irreg-irreg, no murmurs, rubs, or gallops.  Respiratory: CTA b/l. GI: Soft, nontender, non-distended  MS: No edema; No deformity. Neuro:  Nonfocal  Psych: Normal affect   Labs    High Sensitivity Troponin:   Recent Labs  Lab 05/29/22 1450 05/29/22 1845 05/29/22 2053 05/29/22 2313  TROPONINIHS 169* 183* 155* 183*     Chemistry Recent Labs  Lab 06/20/22 1000 06/21/22 0524  NA 136 137  K 3.8 4.1  CL 101 104  CO2 26 25  GLUCOSE 101* 85  BUN 8 11  CREATININE 1.46* 1.23  CALCIUM 8.6* 9.1  MG 2.2 2.0  GFRNONAA  59* >60  ANIONGAP 9 8    Lipids  Recent Labs  Lab 06/14/22 1016  CHOL 188  TRIG 203*  HDL 31*  LABVLDL 36  LDLCALC 121*  CHOLHDL 6.1*    HematologyNo results for input(s): "WBC", "RBC", "HGB", "HCT", "MCV", "MCH", "MCHC", "RDW", "PLT" in the last 168 hours. Thyroid No results for input(s): "TSH", "FREET4" in the last 168 hours.  BNPNo results for input(s): "BNP", "PROBNP" in the last 168 hours.  DDimer No results for input(s): "DDIMER" in the last 168 hours.   Radiology    No results found.  Cardiac Studies    06/01/22: TEE 1. Left ventricular ejection fraction, by estimation, is 30 to 35%. The  left ventricle has moderate to severely decreased function. The left  ventricle demonstrates global hypokinesis. The left ventricular internal  cavity size was mildly dilated.   2. Right ventricular systolic function is mildly reduced. The right  ventricular size is mildly enlarged. There is normal pulmonary artery  systolic pressure.   3. Left atrial size was moderately dilated. No left atrial/left atrial  appendage thrombus was detected.  4. Right atrial size was moderately dilated.   5. The mitral valve is normal in structure. Trivial mitral valve  regurgitation.   6. Tricuspid valve regurgitation is severe.   7. The aortic valve is tricuspid. Aortic valve regurgitation is trivial.   8. Only 2-3 beats of sinus beats post conversion noted before return to  atrial fibrillation.   Patient Profile     46 y.o. male w/PMHx of morbid obesity, HTN, recently found with AFib and new CM (suspected tachy-mediated vs HTN), CHF, suspect sleep apnea, pending w/u, admitted for Tikosyn load  Assessment & Plan    Persistent Afib CHA2DS2Vasc is 2, on Eliquis, appropriately dosed Tikosyn load is in progress K+ 4.1 Mag 2.0 Creat 1.23 (stable QTc stable   Transient brady/pauses w/sleep, suspect apnea, Anthony Delgado reduce his coreg DCCV tomorrow if not in SR, pt aware and agreeable  CM  (suspect tachy-mediated vs HTN) Chronic CHF Compensated currently Home meds  HTN Home meds  For questions or updates, please contact Anthony Delgado Please consult [email or web address] for contact info under        Signed, Anthony Pigeon, PA-C  06/21/2022, 7:26 AM    I have seen and examined this patient with Anthony Delgado.  Agree with above, note added to reflect my findings.  Remains in atrial fibrillation.  Feeling well.  Having intermittent pauses, up to 9 seconds nocturnal.  GEN: Well nourished, well developed, in no acute distress  HEENT: normal  Neck: no JVD, carotid bruits, or masses Cardiac: irregular; no murmurs, rubs, or gallops,no edema  Respiratory:  clear to auscultation bilaterally, normal work of breathing GI: soft, nontender, nondistended, + BS MS: no deformity or atrophy  Skin: warm and dry Neuro:  Strength and sensation are intact Psych: euthymic mood, full affect   Persistent atrial fibrillation: Admitted for dofetilide load.  QTc is remained stable.  Potassium magnesium within acceptable levels.  We Anthony Delgado plan for likely cardioversion tomorrow.  Anthony Delgado M. Maryfrances Portugal MD 06/21/2022 4:21 PM

## 2022-06-21 NOTE — Progress Notes (Signed)
Morning EKG reviewed     Shows pt remains in afib at 78 bpm with stable QT at 480, QTc at 458 ms.  Continue  Tikosyn 500 mcg BID.   Potassium4.1 (10/25 0524) Magnesium  2.0 (10/25 0524) Creatinine, ser  1.23 (10/25 0524)  Pt will be NPO after midnight for DCCV if remains in [place], NP  06/21/2022 11:12 AM

## 2022-06-21 NOTE — Progress Notes (Addendum)
Rounding Note    Patient Name: Anthony Delgado Date of Encounter: 06/21/2022  [place] Cardiologist: Kirk Ruths, MD   Subjective   No c/o, slept well  Inpatient Medications    Scheduled Meds:  apixaban  5 mg Oral BID   carvedilol  12.5 mg Oral BID WC   dapagliflozin propanediol  10 mg Oral Daily   dofetilide  500 mcg Oral BID   furosemide  40 mg Oral BID   sacubitril-valsartan  1 tablet Oral BID   sodium chloride flush  3 mL Intravenous Q12H   spironolactone  12.5 mg Oral Daily   Continuous Infusions:  sodium chloride     PRN Meds: sodium chloride, sodium chloride flush   Vital Signs    Vitals:   06/20/22 1335 06/20/22 2014 06/20/22 2308 06/21/22 0424  BP: 92/73 (!) 154/81 (!) 130/100 111/70  Pulse: 73 77 81 81  Resp: 17     Temp: 98 F (36.7 C) 98.3 F (36.8 C)  98.3 F (36.8 C)  TempSrc: Oral Oral  Oral  SpO2:  98%  98%   No intake or output data in the 24 hours ending 06/21/22 0726    06/20/2022   10:00 AM 06/14/2022    9:11 AM 06/08/2022    8:58 AM  Last 3 Weights  Weight (lbs) 334 lb 6.4 oz 336 lb 335 lb 12.8 oz  Weight (kg) 151.683 kg 152.409 kg 152.318 kg      Telemetry    AFib 60's mostly, overnight/early AM pauses mostly 3-4 seconds or so, once 9 seconds- Personally Reviewed  ECG    AFib 76bpm, QTc 415ms - Personally Reviewed  Physical Exam   GEN: No acute distress.   Neck: No JVD Cardiac: irreg-irreg, no murmurs, rubs, or gallops.  Respiratory: CTA b/l. GI: Soft, nontender, non-distended  MS: No edema; No deformity. Neuro:  Nonfocal  Psych: Normal affect   Labs    High Sensitivity Troponin:   Recent Labs  Lab 05/29/22 1450 05/29/22 1845 05/29/22 2053 05/29/22 2313  TROPONINIHS 169* 183* 155* 183*     Chemistry Recent Labs  Lab 06/20/22 1000 06/21/22 0524  NA 136 137  K 3.8 4.1  CL 101 104  CO2 26 25  GLUCOSE 101* 85  BUN 8 11  CREATININE 1.46* 1.23  CALCIUM 8.6* 9.1  MG 2.2 2.0  GFRNONAA  59* >60  ANIONGAP 9 8    Lipids  Recent Labs  Lab 06/14/22 1016  CHOL 188  TRIG 203*  HDL 31*  LABVLDL 36  LDLCALC 121*  CHOLHDL 6.1*    HematologyNo results for input(s): "WBC", "RBC", "HGB", "HCT", "MCV", "MCH", "MCHC", "RDW", "PLT" in the last 168 hours. Thyroid No results for input(s): "TSH", "FREET4" in the last 168 hours.  BNPNo results for input(s): "BNP", "PROBNP" in the last 168 hours.  DDimer No results for input(s): "DDIMER" in the last 168 hours.   Radiology    No results found.  Cardiac Studies    06/01/22: TEE 1. Left ventricular ejection fraction, by estimation, is 30 to 35%. The  left ventricle has moderate to severely decreased function. The left  ventricle demonstrates global hypokinesis. The left ventricular internal  cavity size was mildly dilated.   2. Right ventricular systolic function is mildly reduced. The right  ventricular size is mildly enlarged. There is normal pulmonary artery  systolic pressure.   3. Left atrial size was moderately dilated. No left atrial/left atrial  appendage thrombus was detected.  4. Right atrial size was moderately dilated.   5. The mitral valve is normal in structure. Trivial mitral valve  regurgitation.   6. Tricuspid valve regurgitation is severe.   7. The aortic valve is tricuspid. Aortic valve regurgitation is trivial.   8. Only 2-3 beats of sinus beats post conversion noted before return to  atrial fibrillation.   Patient Profile     46 y.o. male w/PMHx of morbid obesity, HTN, recently found with AFib and new CM (suspected tachy-mediated vs HTN), CHF, suspect sleep apnea, pending w/u, admitted for Tikosyn load  Assessment & Plan    Persistent Afib CHA2DS2Vasc is 2, on Eliquis, appropriately dosed Tikosyn load is in progress K+ 4.1 Mag 2.0 Creat 1.23 (stable QTc stable   Transient brady/pauses w/sleep, suspect apnea, Albertha Beattie reduce his coreg DCCV tomorrow if not in SR, pt aware and agreeable  CM  (suspect tachy-mediated vs HTN) Chronic CHF Compensated currently Home meds  HTN Home meds  For questions or updates, please contact [facility] Please consult [email or web address] for contact info under        Signed, Sheilah Pigeon, PA-C  06/21/2022, 7:26 AM    I have seen and examined this patient with [name].  Agree with above, note added to reflect my findings.  Remains in atrial fibrillation.  Feeling well.  Having intermittent pauses, up to 9 seconds nocturnal.  GEN: Well nourished, well developed, in no acute distress  HEENT: normal  Neck: no JVD, carotid bruits, or masses Cardiac: irregular; no murmurs, rubs, or gallops,no edema  Respiratory:  clear to auscultation bilaterally, normal work of breathing GI: soft, nontender, nondistended, + BS MS: no deformity or atrophy  Skin: warm and dry Neuro:  Strength and sensation are intact Psych: euthymic mood, full affect   Persistent atrial fibrillation: Admitted for dofetilide load.  QTc is remained stable.  Potassium magnesium within acceptable levels.  We Gao Mitnick plan for likely cardioversion tomorrow.  Aarsh Fristoe M. Ishani Goldwasser MD 06/21/2022 4:21 PM

## 2022-06-21 NOTE — TOC Benefit Eligibility Note (Signed)
Patient Teacher, English as a foreign language completed.    The patient is currently admitted and upon discharge could be taking dofetilide (Tikosyn) 500 mcg capsules.  Requires Prior Authorization  The patient is insured through [place] Patient Advocate Specialist [place] Patient Advocate Team Direct Number: [phone]  Fax: [phone]

## 2022-06-21 NOTE — Telephone Encounter (Signed)
Pharmacy Patient Advocate Encounter  Insurance verification completed.    The patient is insured through [employer]   The patient is currently admitted and ran test claims for the following: dofetilide (Tikosyn) .  Copays and coinsurance results were relayed to Inpatient clinical team.  

## 2022-06-21 NOTE — Progress Notes (Signed)
Notified by CCMD at 0459 that Pt had a HR 25 nonsustained. Pt then had a 3.73 second pause followed by a 4.3 second pause. Pt was asleep but easily aroused and nonsymptomatic. Strip saved. Will continue to monitor

## 2022-06-22 ENCOUNTER — Encounter (HOSPITAL_COMMUNITY)
Admission: AD | Disposition: A | Discharge status: Home / Self Care | Payer: Self-pay | Source: Ambulatory Visit | Attending: Nurse Practitioner

## 2022-06-22 ENCOUNTER — Inpatient Hospital Stay (HOSPITAL_COMMUNITY): Payer: Commercial Managed Care - PPO | Admitting: Anesthesiology

## 2022-06-22 ENCOUNTER — Other Ambulatory Visit: Payer: Self-pay

## 2022-06-22 ENCOUNTER — Encounter (HOSPITAL_COMMUNITY): Payer: Self-pay | Admitting: Cardiology

## 2022-06-22 DIAGNOSIS — Z8673 Personal history of transient ischemic attack (TIA), and cerebral infarction without residual deficits: Secondary | ICD-10-CM

## 2022-06-22 DIAGNOSIS — I4891 Unspecified atrial fibrillation: Secondary | ICD-10-CM

## 2022-06-22 DIAGNOSIS — I4819 Other persistent atrial fibrillation: Secondary | ICD-10-CM | POA: Diagnosis not present

## 2022-06-22 DIAGNOSIS — I10 Essential (primary) hypertension: Secondary | ICD-10-CM

## 2022-06-22 DIAGNOSIS — I4892 Unspecified atrial flutter: Secondary | ICD-10-CM

## 2022-06-22 HISTORY — PX: CARDIOVERSION: SHX1299

## 2022-06-22 LAB — MAGNESIUM: Magnesium: 2.2 mg/dL (ref 1.7–2.4)

## 2022-06-22 LAB — BASIC METABOLIC PANEL
Anion gap: 9 (ref 5–15)
BUN: 11 mg/dL (ref 6–20)
CO2: 24 mmol/L (ref 22–32)
Calcium: 9 mg/dL (ref 8.9–10.3)
Chloride: 103 mmol/L (ref 98–111)
Creatinine, Ser: 1.24 mg/dL (ref 0.61–1.24)
GFR, Estimated: >60 mL/min (ref >60)
Glucose, Bld: 86 mg/dL (ref 70–99)
Potassium: 4.3 mmol/L (ref 3.5–5.1)
Sodium: 136 mmol/L (ref 135–145)

## 2022-06-22 SURGERY — CARDIOVERSION
Anesthesia: General

## 2022-06-22 MED ORDER — PROPOFOL 10 MG/ML IV BOLUS
INTRAVENOUS | Status: DC | PRN
  Administered 2022-06-22: 20 mg via INTRAVENOUS
  Administered 2022-06-22: 35 mg via INTRAVENOUS
  Administered 2022-06-22: 20 mg via INTRAVENOUS

## 2022-06-22 MED ORDER — SODIUM CHLORIDE 0.9 % IV SOLN: INTRAVENOUS | Status: DC | PRN

## 2022-06-22 MED ORDER — LIDOCAINE 2% (20 MG/ML) 5 ML SYRINGE
INTRAMUSCULAR | Status: DC | PRN
  Administered 2022-06-22: 40 mg via INTRAVENOUS

## 2022-06-22 NOTE — Anesthesia Preprocedure Evaluation (Signed)
Anesthesia Evaluation  Patient identified by MRN, date of birth, ID band Patient awake    Reviewed: Allergy & Precautions, NPO status , Patient's Chart, lab work & pertinent test results  Airway Mallampati: III  TM Distance: >3 FB Neck ROM: Full    Dental  (+) Teeth Intact, Dental Advisory Given   Pulmonary    breath sounds clear to auscultation       Cardiovascular hypertension, Pt. on home beta blockers and Pt. on medications + dysrhythmias Atrial Fibrillation  Rhythm:Irregular Rate:Tachycardia  Echo: 1. Left ventricular ejection fraction, by estimation, is 30 to 35%. The  left ventricle has moderate to severely decreased function. The left  ventricle demonstrates global hypokinesis. The left ventricular internal  cavity size was mildly dilated.  2. Right ventricular systolic function is mildly reduced. The right  ventricular size is mildly enlarged. There is normal pulmonary artery  systolic pressure.  3. Left atrial size was moderately dilated. No left atrial/left atrial  appendage thrombus was detected.  4. Right atrial size was moderately dilated.  5. The mitral valve is normal in structure. Trivial mitral valve  regurgitation.  6. Tricuspid valve regurgitation is severe.  7. The aortic valve is tricuspid. Aortic valve regurgitation is trivial.  8. Only 2-3 beats of sinus beats post conversion noted before return to  atrial fibrillation.    Neuro/Psych TIA   GI/Hepatic negative GI ROS, Neg liver ROS,   Endo/Other  negative endocrine ROS  Renal/GU Renal disease     Musculoskeletal negative musculoskeletal ROS (+)   Abdominal (+) + obese,   Peds  Hematology negative hematology ROS (+)   Anesthesia Other Findings   Reproductive/Obstetrics                             Anesthesia Physical Anesthesia Plan  ASA: 3  Anesthesia Plan: General   Post-op Pain Management:     Induction: Intravenous  PONV Risk Score and Plan: 0  Airway Management Planned: Natural Airway and Mask  Additional Equipment: None  Intra-op Plan:   Post-operative Plan:   Informed Consent: I have reviewed the patients History and Physical, chart, labs and discussed the procedure including the risks, benefits and alternatives for the proposed anesthesia with the patient or authorized representative who has indicated his/her understanding and acceptance.       Plan Discussed with: CRNA  Anesthesia Plan Comments:         Anesthesia Quick Evaluation

## 2022-06-22 NOTE — Transfer of Care (Signed)
Immediate Anesthesia Transfer of Care Note  Patient: Anthony Delgado  Procedure(s) Performed: CARDIOVERSION  Patient Location: PACU  Anesthesia Type:General  Level of Consciousness: awake and alert   Airway & Oxygen Therapy: Patient Spontanous Breathing  Post-op Assessment: Report given to RN and Post -op Vital signs reviewed and stable  Post vital signs: Reviewed and stable  Last Vitals:  Vitals Value Taken Time  BP 158/124   Temp    Pulse 76   Resp 12   SpO2 97%     Last Pain:  Vitals:   06/22/22 0905  TempSrc: Temporal  PainSc: 0-No pain      Patients Stated Pain Goal: 0 (00/51/10 2111)  Complications: No notable events documented.

## 2022-06-22 NOTE — CV Procedure (Signed)
   DIRECT CURRENT CARDIOVERSION  NAME:  Anthony Delgado    MRN: [id number] DOB:  07-Dec-1975    ADMIT DATE: 06/20/2022  Indication:  Symptomatic atrial fibrillation  Procedure Note:  The patient signed informed consent.  They have had had therapeutic anticoagulation with eliquis greater than 3 weeks.  Anesthesia was administered by Dr. Smith Robert.  Adequate airway was maintained throughout and vital followed per protocol.  They were cardioverted x 1 with 200J of biphasic synchronized energy.  They converted to NSR.  There were no apparent complications.  The patient had normal neuro status and respiratory status post procedure with vitals stable as recorded elsewhere.    Follow up: They will continue on current medical therapy and follow up with cardiology as scheduled.  Lake Bells T. Audie Box, MD, [place]  [address], [place], [place] [address] [phone]  10:47 AM

## 2022-06-22 NOTE — Progress Notes (Addendum)
Rounding Note    Patient Name: Anthony Delgado Date of Encounter: 06/22/2022  [facility] Cardiologist: Olga Millers, MD   Subjective   NAEON. Sleeping well this AM. No acute complaints or concerns  Inpatient Medications    Scheduled Meds:  apixaban  5 mg Oral BID   carvedilol  6.25 mg Oral BID WC   dapagliflozin propanediol  10 mg Oral Daily   dofetilide  500 mcg Oral BID   furosemide  40 mg Oral BID   sacubitril-valsartan  1 tablet Oral BID   sodium chloride flush  3 mL Intravenous Q12H   spironolactone  12.5 mg Oral Daily   Continuous Infusions:  sodium chloride     sodium chloride     PRN Meds: sodium chloride, sodium chloride flush   Vital Signs    Vitals:   06/21/22 2110 06/22/22 0424 06/22/22 0804 06/22/22 0810  BP: 123/89 116/85 114/87 114/87  Pulse: 70 65  70  Resp:  18  16  Temp: 98.3 F (36.8 C) 97.6 F (36.4 C)  97.6 F (36.4 C)  TempSrc: Oral Oral  Oral  SpO2:  97%  96%    Intake/Output Summary (Last 24 hours) at 06/22/2022 0824 Last data filed at 06/21/2022 1010 Gross per 24 hour  Intake 50 ml  Output --  Net 50 ml      06/20/2022   10:00 AM 06/14/2022    9:11 AM 06/08/2022    8:58 AM  Last 3 Weights  Weight (lbs) 334 lb 6.4 oz 336 lb 335 lb 12.8 oz  Weight (kg) 151.683 kg 152.409 kg 152.318 kg      Telemetry    AFib with some bradycardia - Personally Reviewed  ECG    10/26 - AFib 70bpm, QT , QTc - Personally Reviewed  Physical Exam   GEN: No acute distress.   Neck: No JVD Cardiac: irreg-irreg, no murmurs, rubs, or gallops.  Respiratory: CTA b/l. GI: Soft, nontender, non-distended  MS: No edema; No deformity. Neuro:  Nonfocal  Psych: Normal affect   Labs    High Sensitivity Troponin:   Recent Labs  Lab 05/29/22 1450 05/29/22 1845 05/29/22 2053 05/29/22 2313  TROPONINIHS 169* 183* 155* 183*     Chemistry Recent Labs  Lab 06/20/22 1000 06/21/22 0524 06/22/22 0249  NA 136 137 136   K 3.8 4.1 4.3  CL 101 104 103  CO2 26 25 24   GLUCOSE 101* 85 86  BUN 8 11 11   CREATININE 1.46* 1.23 1.24  CALCIUM 8.6* 9.1 9.0  MG 2.2 2.0 2.2  GFRNONAA 59* >60 >60  ANIONGAP 9 8 9     Lipids  No results for input(s): "CHOL", "TRIG", "HDL", "LABVLDL", "LDLCALC", "CHOLHDL" in the last 168 hours.   HematologyNo results for input(s): "WBC", "RBC", "HGB", "HCT", "MCV", "MCH", "MCHC", "RDW", "PLT" in the last 168 hours. Thyroid No results for input(s): "TSH", "FREET4" in the last 168 hours.  BNPNo results for input(s): "BNP", "PROBNP" in the last 168 hours.  DDimer No results for input(s): "DDIMER" in the last 168 hours.   Radiology    No results found.  Cardiac Studies    06/01/22: TEE 1. Left ventricular ejection fraction, by estimation, is 30 to 35%. The  left ventricle has moderate to severely decreased function. The left  ventricle demonstrates global hypokinesis. The left ventricular internal  cavity size was mildly dilated.   2. Right ventricular systolic function is mildly reduced. The right  ventricular size is  mildly enlarged. There is normal pulmonary artery  systolic pressure.   3. Left atrial size was moderately dilated. No left atrial/left atrial  appendage thrombus was detected.   4. Right atrial size was moderately dilated.   5. The mitral valve is normal in structure. Trivial mitral valve  regurgitation.   6. Tricuspid valve regurgitation is severe.   7. The aortic valve is tricuspid. Aortic valve regurgitation is trivial.   8. Only 2-3 beats of sinus beats post conversion noted before return to  atrial fibrillation.   Patient Profile     46 y.o. male w/PMHx of morbid obesity, HTN, recently found with AFib and new CM (suspected tachy-mediated vs HTN), CHF, suspect sleep apnea, pending w/u, admitted for Tikosyn load  Assessment & Plan    Persistent Afib CHA2DS2Vasc is 2, on Eliquis, appropriately dosed Tikosyn load is in progress QTc stable   Electrolyes stable, no repletion needed DCCV planned for today if does not convert to SN, NPO for procedure   CM (suspect tachy-mediated vs HTN) Chronic systolic heart failure Compensated currently Home meds  HTN Home meds  For questions or updates, please contact [place] Please consult [email or web address] for contact info under        Signed, Mamie Levers, NP  06/22/2022, 8:24 AM    I have seen and examined this patient with Mamie Levers.  Agree with above, note added to reflect my findings.  Patient remains in atrial fibrillation.  Feeling well.  Ready for cardioversion today.  GEN: Well nourished, well developed, in no acute distress  HEENT: normal  Neck: no JVD, carotid bruits, or masses Cardiac: irregular; no murmurs, rubs, or gallops,no edema  Respiratory:  clear to auscultation bilaterally, normal work of breathing GI: soft, nontender, nondistended, + BS MS: no deformity or atrophy  Skin: warm and dry Neuro:  Strength and sensation are intact Psych: euthymic mood, full affect   Persistent atrial fibrillation: Currently undergoing dofetilide load.  QTc is remained stable.  Continue Eliquis 5 mg twice daily.  Plan for cardioversion today.  Fidel Caggiano M. Ayomide Zuleta MD 06/22/2022 11:31 AM

## 2022-06-22 NOTE — Anesthesia Postprocedure Evaluation (Signed)
Anesthesia Post Note  Patient: Anthony Delgado  Procedure(s) Performed: CARDIOVERSION     Patient location during evaluation: PACU Anesthesia Type: General Level of consciousness: awake and alert Pain management: pain level controlled Vital Signs Assessment: post-procedure vital signs reviewed and stable Respiratory status: spontaneous breathing, nonlabored ventilation, respiratory function stable and patient connected to nasal cannula oxygen Cardiovascular status: blood pressure returned to baseline and stable Postop Assessment: no apparent nausea or vomiting Anesthetic complications: no   No notable events documented.  Last Vitals:  Vitals:   06/22/22 1207 06/22/22 1340  BP:  98/75  Pulse: 70   Resp: 20   Temp: 36.6 C   SpO2:      Last Pain:  Vitals:   06/22/22 1207  TempSrc: Oral  PainSc:                  Effie Berkshire

## 2022-06-22 NOTE — Interval H&P Note (Signed)
History and Physical Interval Note:  06/22/2022 9:09 AM  Anthony Delgado  has presented today for surgery, with the diagnosis of afib/aflutter.  The various methods of treatment have been discussed with the patient and family. After consideration of risks, benefits and other options for treatment, the patient has consented to  Procedure(s): CARDIOVERSION (N/A) as a surgical intervention.  The patient's history has been reviewed, patient examined, no change in status, stable for surgery.  I have reviewed the patient's chart and labs.  Questions were answered to the patient's satisfaction.    NPO for DCCV. On eliquis >3 weeks.   Lake Bells T. Audie Box, MD, [place]  [address] [place],  [address] [phone]  9:10 AM

## 2022-06-22 NOTE — Progress Notes (Signed)
Pharmacy: Dofetilide (Tikosyn) - Follow Up Assessment and Electrolyte Replacement  Pharmacy consulted to assist in monitoring and replacing electrolytes in this 46 y.o. male admitted on 06/20/2022 undergoing dofetilide initiation. First dofetilide dose: 06/20/22.  Labs:    Component Value Date/Time   K 4.3 06/22/2022 0249   MG 2.2 06/22/2022 0249     Plan: Potassium: K >/= 4: No additional supplementation needed  Magnesium: Mg > 2: No additional supplementation needed   Potassium and magnesium now at goal without supplementation needed this morning.   Thank you for allowing pharmacy to participate in this patient's care   Erin Hearing PharmD., BCPS Clinical Pharmacist 06/22/2022 8:02 AM

## 2022-06-23 ENCOUNTER — Other Ambulatory Visit (HOSPITAL_COMMUNITY): Payer: Self-pay

## 2022-06-23 LAB — BASIC METABOLIC PANEL
Anion gap: 7 (ref 5–15)
BUN: 11 mg/dL (ref 6–20)
CO2: 26 mmol/L (ref 22–32)
Calcium: 8.8 mg/dL — ABNORMAL LOW (ref 8.9–10.3)
Chloride: 104 mmol/L (ref 98–111)
Creatinine, Ser: 1.26 mg/dL — ABNORMAL HIGH (ref 0.61–1.24)
GFR, Estimated: >60 mL/min (ref >60)
Glucose, Bld: 84 mg/dL (ref 70–99)
Potassium: 3.9 mmol/L (ref 3.5–5.1)
Sodium: 137 mmol/L (ref 135–145)

## 2022-06-23 LAB — MAGNESIUM: Magnesium: 2.1 mg/dL (ref 1.7–2.4)

## 2022-06-23 MED ORDER — POTASSIUM CHLORIDE CRYS ER 20 MEQ PO TBCR
40.0000 meq | EXTENDED_RELEASE_TABLET | Freq: Once | ORAL | Status: AC
  Administered 2022-06-23: 40 meq via ORAL
  Filled 2022-06-23: qty 2

## 2022-06-23 MED ORDER — CARVEDILOL 6.25 MG PO TABS
6.2500 mg | ORAL_TABLET | Freq: Two times a day (BID) | ORAL | 11 refills | Status: DC
  Filled 2022-06-23 – 2022-06-28 (×2): qty 60, 30d supply, fill #0

## 2022-06-23 MED ORDER — DOFETILIDE 500 MCG PO CAPS
500.0000 ug | ORAL_CAPSULE | Freq: Two times a day (BID) | ORAL | 11 refills | Status: DC
  Filled 2022-06-23: qty 60, 30d supply, fill #0

## 2022-06-23 NOTE — Discharge Summary (Addendum)
ELECTROPHYSIOLOGY PROCEDURE DISCHARGE SUMMARY    Patient ID: Abir Camden,  MRN: [id number], DOB/AGE: [birth year] 46 y.o.  Admit date: 06/20/2022 Discharge date: 06/23/2022  Primary Care Physician: [name], FNP  Primary Cardiologist: Olga Millers, MD  Electrophysiologist: [facility] , Dr. Elberta Fortis  Primary Discharge Diagnosis:  1.  Paroxysmal atrial fibrillation status post Tikosyn loading this admission  Secondary Discharge Diagnosis:  HFrEF HTN Morbid obesity Concern for sleep apnea  No Known Allergies   Procedures This Admission:  1.  Tikosyn loading  2.  Direct current cardioversion on Thursday June 22, 2022 by Dr Flora Lipps which successfully restored SR. Unfortunately, patient had early return of Afib  There were no early apparent complications.   Brief HPI: Korbyn Symmes is a 46 y.o. male with a past medical history as noted above.  They were referred to EP for treatment options of atrial fibrillation.  Risks, benefits, and alternatives to Tikosyn were reviewed with the patient who wished to proceed with admission for loading.  Hospital Course:  The patient was admitted and Tikosyn was initiated.  Renal function and electrolytes were followed during the hospitalization. Potassium repletion was intermittently needed, consider increased spiro if K repletion becomes frequently needed. Their QTc remained stable. On 06/22/22 they underwent direct current cardioversion which restored sinus rhythm, but had ERAF and has remained in afib. The patients QTc remained stable. They were monitored on telemetry up to discharge. On the day of discharge, they were examined by Dr. Elberta Fortis  who considered them stable for discharge to home.  Follow-up has been arranged with the Atrial Fibrillation clinic in approximately 1 week.   Throughout hospitalization, patient frequently desaturated when sleeping. It was strongly recommended that he obtain a sleep study to facilitate  maintaining sinus rhythm. He has pulmonology appt scheduled for next week. I have asked him to discuss with pulm regarding ordering sleep study, but to reach out to afib clinic if needed to obtain this test.   Physical Exam: Vitals:   06/22/22 1858 06/22/22 2021 06/23/22 0645 06/23/22 0837  BP: (!) 144/68 100/77 105/78 (!) 116/93  Pulse: 80  69 76  Resp:  18 20 19   Temp:  98.2 F (36.8 C) 97.6 F (36.4 C) 98.2 F (36.8 C)  TempSrc:  Oral Oral Oral  SpO2:  90% 96% 96%  Weight:      Height:        GEN- The patient is well appearing, alert and oriented x 3 today.   HEENT: normocephalic, atraumatic; sclera clear, conjunctiva pink; hearing intact; oropharynx clear; neck supple, no JVP Lymph- no cervical lymphadenopathy Lungs- Clear to ausculation bilaterally, normal work of breathing.  No wheezes, rales, rhonchi Heart- irregular rate and rhythm, no murmurs, rubs or gallops, PMI not laterally displaced GI- soft, non-tender, obese, bowel sounds present, no hepatosplenomegaly Extremities- no clubbing, cyanosis, or edema; DP/PT/radial pulses 2+ bilaterally MS- no significant deformity or atrophy Skin- warm and dry, no rash or lesion Psych- euthymic mood, full affect Neuro- strength and sensation are intact  Labs:   Lab Results  Component Value Date   WBC 5.1 06/08/2022   HGB 17.5 (H) 06/08/2022   HCT 53.1 (H) 06/08/2022   MCV 91.7 06/08/2022   PLT 353 06/08/2022    Recent Labs  Lab 06/23/22 0322  NA 137  K 3.9  CL 104  CO2 26  BUN 11  CREATININE 1.26*  CALCIUM 8.8*  GLUCOSE 84    Discharge Medications:  Allergies as of 06/23/2022  No Known Allergies      Medication List     TAKE these medications    albuterol 108 (90 Base) MCG/ACT inhaler Commonly known as: VENTOLIN HFA Inhale 2 puffs into the lungs every 4 (four) hours as needed for wheezing or shortness of breath.   carvedilol 6.25 MG tablet Commonly known as: COREG Take 1 tablet (6.25 mg total) by  mouth 2 (two) times daily with a meal. What changed:  medication strength how much to take   dofetilide 500 MCG capsule Commonly known as: TIKOSYN Take 1 capsule (500 mcg total) by mouth 2 (two) times daily.   Eliquis 5 MG Tabs tablet Generic drug: apixaban Take 1 tablet (5 mg total) by mouth 2 (two) times daily.   Entresto 49-51 MG Generic drug: sacubitril-valsartan Take 1 tablet by mouth 2 (two) times daily.   Farxiga 10 MG Tabs tablet Generic drug: dapagliflozin propanediol Take 1 tablet (10 mg total) by mouth daily.   furosemide 40 MG tablet Commonly known as: LASIX Take 1 tablet (40 mg total) by mouth 2 (two) times daily.   spironolactone 25 MG tablet Commonly known as: ALDACTONE Take 0.5 tablets (12.5 mg total) by mouth daily.   traMADol 50 MG tablet Commonly known as: ULTRAM Take 1 tablet (50 mg total) by mouth every 6 (six) hours as needed. What changed:  when to take this reasons to take this        Disposition:    Follow-up Information     Newman Nip, NP. Go to.   Specialties: Nurse Practitioner, Cardiology Why: 11/2 at 3:30pm Contact information: [address] [place] [address] [phone]                 Duration of Discharge Encounter: Greater than 30 minutes including physician time.  Signed, Sherie Don, NP  06/23/2022 12:29 PM    I have seen and examined this patient with Sherie Don.  Agree with above, note added to reflect my findings.  Patient mid to the hospital for dofetilide load.  QTc remained stable.  Had cardioversion, but unfortunately went back into atrial fibrillation.  We Dawt Reeb plan for discharge today with follow-up in A-fib clinic.  It was discussed with the patient the importance of treating his sleep apnea as well as weight loss.  I feel that if he does not do these things, we would not be able to keep him in rhythm.  May need a repeat cardioversion on dofetilide as an outpatient.  GEN: Well  nourished, well developed, in no acute distress  HEENT: normal  Neck: no JVD, carotid bruits, or masses Cardiac: Irregular; no murmurs, rubs, or gallops,no edema  Respiratory:  clear to auscultation bilaterally, normal work of breathing GI: soft, nontender, nondistended, + BS MS: no deformity or atrophy  Skin: warm and dry Neuro:  Strength and sensation are intact Psych: euthymic mood, full affect     Zannie Runkle M. Tyrrell Stephens MD 06/26/2022 9:05 AM

## 2022-06-23 NOTE — Progress Notes (Signed)
Pharmacy: Dofetilide (Tikosyn) - Follow Up Assessment and Electrolyte Replacement  Pharmacy consulted to assist in monitoring and replacing electrolytes in this 46 y.o. male admitted on 06/20/2022 undergoing dofetilide initiation. First dofetilide dose: 06/20/22.  Labs:    Component Value Date/Time   K 3.9 06/23/2022 0322   MG 2.1 06/23/2022 0322     Plan: Potassium: K 3.8-3.9:  Give KCl 40 mEq po x1   Magnesium: Mg > 2: No additional supplementation needed  Unfortunately back in afib overnight after cardioversion.   Patient has required a total of 35meq of potassium this admit. Could consider adding 60meq of potassium at discharge vs follow up labs in afib clinic next week.   Thank you for allowing pharmacy to participate in this patient's care   Erin Hearing PharmD., BCPS Clinical Pharmacist 06/23/2022 7:32 AM

## 2022-06-23 NOTE — Progress Notes (Signed)
Pt's 2 hr post EKG was completed @ 6073, pt subsequently back in atrial fibrillation & prolonged Qtc. Another 12-lead EKG obtained. Pt still having frequent pauses while sleeping.  Elaina Hoops, RN

## 2022-06-25 ENCOUNTER — Encounter (HOSPITAL_COMMUNITY): Payer: Self-pay | Admitting: Cardiovascular Disease

## 2022-06-26 ENCOUNTER — Other Ambulatory Visit (HOSPITAL_COMMUNITY): Payer: Self-pay

## 2022-06-28 ENCOUNTER — Ambulatory Visit (INDEPENDENT_AMBULATORY_CARE_PROVIDER_SITE_OTHER): Payer: Commercial Managed Care - PPO | Admitting: Pulmonary Disease

## 2022-06-28 VITALS — BP 128/72 | HR 87 | Ht 70.0 in | Wt 333.6 lb

## 2022-06-28 DIAGNOSIS — I4891 Unspecified atrial fibrillation: Secondary | ICD-10-CM | POA: Diagnosis not present

## 2022-06-28 DIAGNOSIS — J810 Acute pulmonary edema: Secondary | ICD-10-CM | POA: Diagnosis not present

## 2022-06-28 DIAGNOSIS — R918 Other nonspecific abnormal finding of lung field: Secondary | ICD-10-CM | POA: Diagnosis not present

## 2022-06-28 NOTE — Progress Notes (Signed)
Synopsis: Referred in Nov 2023 for Lung nodule/ggo by Elodia Florence.,*  Subjective:   PATIENT ID: Anthony Delgado GENDER: male DOB: 02-Apr-1976, MRN: [id number]  Chief Complaint  Patient presents with   Consult    Lung nodule.    46 yo M, PMH HTN. Referred for evaluation of lung nodule. Patient had a CT scan of the chest October 2023 which revealed small linear density in the lower lung fields and patchy faint groundglass densities within both lungs.  From respiratory standpoint feeling much better.  He is currently managed for his heart failure.  He has atrial fibrillation as well.  Currently on Tikosyn.  I reviewed his CT imaging in the time that he was brought into the hospital was in heart failure.  His groundglass opacities within the lung are likely related to pulmonary edema.    Past Medical History:  Diagnosis Date   Hypertension      Family History  Problem Relation Age of Onset   Cancer Mother    Stroke Neg Hx        None at early ages   CAD Neg Hx        None at early ages     Past Surgical History:  Procedure Laterality Date   CARDIOVERSION N/A 06/01/2022   Procedure: CARDIOVERSION;  Surgeon: Jerline Pain, MD;  Location: [facility] ENDOSCOPY;  Service: Cardiovascular;  Laterality: N/A;   CARDIOVERSION N/A 06/22/2022   Procedure: CARDIOVERSION;  Surgeon: Geralynn Rile, MD;  Location: [place];  Service: Cardiovascular;  Laterality: N/A;   CHOLECYSTECTOMY  2009   TEE WITHOUT CARDIOVERSION N/A 06/01/2022   Procedure: TRANSESOPHAGEAL ECHOCARDIOGRAM (TEE);  Surgeon: Jerline Pain, MD;  Location: [facility] ENDOSCOPY;  Service: Cardiovascular;  Laterality: N/A;    Social History   Socioeconomic History   Marital status: Married    Spouse name: Izora Gala   Number of children: 4   Years of education: Not on file   Highest education level: High school graduate  Occupational History   Occupation: Sports coach  Tobacco Use   Smoking status: Never   Smokeless tobacco:  Never  Vaping Use   Vaping Use: Never used  Substance and Sexual Activity   Alcohol use: No   Drug use: No   Sexual activity: Yes    Partners: Female  Other Topics Concern   Not on file  Social History Narrative   Not on file   Social Determinants of Health   Financial Resource Strain: Low Risk  (06/01/2022)   Overall Financial Resource Strain (CARDIA)    Difficulty of Paying Living Expenses: Not hard at all  Food Insecurity: No Food Insecurity (06/20/2022)   Hunger Vital Sign    Worried About Running Out of Food in the Last Year: Never true    Ran Out of Food in the Last Year: Never true  Transportation Needs: No Transportation Needs (06/20/2022)   PRAPARE - Hydrologist (Medical): No    Lack of Transportation (Non-Medical): No  Physical Activity: Not on file  Stress: Not on file  Social Connections: Not on file  Intimate Partner Violence: Not At Risk (06/20/2022)   Humiliation, Afraid, Rape, and Kick questionnaire    Fear of Current or Ex-Partner: No    Emotionally Abused: No    Physically Abused: No    Sexually Abused: No     No Known Allergies   Outpatient Medications Prior to Visit  Medication Sig Dispense Refill  albuterol (PROVENTIL HFA;VENTOLIN HFA) 108 (90 Base) MCG/ACT inhaler Inhale 2 puffs into the lungs every 4 (four) hours as needed for wheezing or shortness of breath. 1 Inhaler 0   apixaban (ELIQUIS) 5 MG TABS tablet Take 1 tablet (5 mg total) by mouth 2 (two) times daily. 60 tablet 1   carvedilol (COREG) 6.25 MG tablet Take 1 tablet (6.25 mg total) by mouth 2 (two) times daily with a meal. 60 tablet 11   dapagliflozin propanediol (FARXIGA) 10 MG TABS tablet Take 1 tablet (10 mg total) by mouth daily. 30 tablet 1   dofetilide (TIKOSYN) 500 MCG capsule Take 1 capsule (500 mcg total) by mouth 2 (two) times daily. 60 capsule 11   furosemide (LASIX) 40 MG tablet Take 1 tablet (40 mg total) by mouth 2 (two) times daily. 60 tablet 1    sacubitril-valsartan (ENTRESTO) 49-51 MG Take 1 tablet by mouth 2 (two) times daily. 60 tablet 1   spironolactone (ALDACTONE) 25 MG tablet Take 0.5 tablets (12.5 mg total) by mouth daily. 15 tablet 1   traMADol (ULTRAM) 50 MG tablet Take 1 tablet (50 mg total) by mouth every 6 (six) hours as needed. (Patient taking differently: Take 50 mg by mouth daily as needed for moderate pain.) 20 tablet 0   No facility-administered medications prior to visit.    Review of Systems  Constitutional:  Positive for weight loss. Negative for chills, fever and malaise/fatigue.  HENT:  Negative for hearing loss, sore throat and tinnitus.   Eyes:  Negative for blurred vision and double vision.  Respiratory:  Positive for shortness of breath. Negative for cough, hemoptysis, sputum production, wheezing and stridor.   Cardiovascular:  Negative for chest pain, palpitations, orthopnea, leg swelling and PND.  Gastrointestinal:  Negative for abdominal pain, constipation, diarrhea, heartburn, nausea and vomiting.  Genitourinary:  Negative for dysuria, hematuria and urgency.  Musculoskeletal:  Negative for joint pain and myalgias.  Skin:  Negative for itching and rash.  Neurological:  Negative for dizziness, tingling, weakness and headaches.  Endo/Heme/Allergies:  Negative for environmental allergies. Does not bruise/bleed easily.  Psychiatric/Behavioral:  Negative for depression. The patient is not nervous/anxious and does not have insomnia.   All other systems reviewed and are negative.    Objective:  Physical Exam Vitals reviewed.  Constitutional:      General: He is not in acute distress.    Appearance: He is well-developed. He is obese.  HENT:     Head: Normocephalic and atraumatic.  Eyes:     General: No scleral icterus.    Conjunctiva/sclera: Conjunctivae normal.     Pupils: Pupils are equal, round, and reactive to light.  Neck:     Vascular: No JVD.     Trachea: No tracheal deviation.   Cardiovascular:     Rate and Rhythm: Normal rate and regular rhythm.     Heart sounds: Normal heart sounds. No murmur heard. Pulmonary:     Effort: Pulmonary effort is normal. No tachypnea, accessory muscle usage or respiratory distress.     Breath sounds: No stridor. No wheezing, rhonchi or rales.  Abdominal:     General: There is no distension.     Palpations: Abdomen is soft.     Tenderness: There is no abdominal tenderness.  Musculoskeletal:        General: No tenderness.     Cervical back: Neck supple.  Lymphadenopathy:     Cervical: No cervical adenopathy.  Skin:    General: Skin is warm and  dry.     Capillary Refill: Capillary refill takes less than 2 seconds.     Findings: No rash.  Neurological:     Mental Status: He is alert and oriented to person, place, and time.  Psychiatric:        Behavior: Behavior normal.      Vitals:   06/28/22 1601  BP: 128/72  Pulse: 87  SpO2: 97%  Weight: (!) 333 lb 9.6 oz (151.3 kg)  Height: 5\' 10"  (1.778 m)   97% on RA BMI Readings from Last 3 Encounters:  06/28/22 47.87 kg/m  06/22/22 47.99 kg/m  06/20/22 47.98 kg/m   Wt Readings from Last 3 Encounters:  06/28/22 (!) 333 lb 9.6 oz (151.3 kg)  06/22/22 (!) 334 lb 7 oz (151.7 kg)  06/20/22 (!) 334 lb 6.4 oz (151.7 kg)     CBC    Component Value Date/Time   WBC 5.1 06/08/2022 1015   RBC 5.79 06/08/2022 1015   HGB 17.5 (H) 06/08/2022 1015   HGB 17.0 12/15/2019 1710   HCT 53.1 (H) 06/08/2022 1015   HCT 48.5 12/15/2019 1710   PLT 353 06/08/2022 1015   PLT 254 12/15/2019 1710   MCV 91.7 06/08/2022 1015   MCV 89 12/15/2019 1710   MCH 30.2 06/08/2022 1015   MCHC 33.0 06/08/2022 1015   RDW 13.5 06/08/2022 1015   RDW 13.9 12/15/2019 1710   LYMPHSABS 2.3 06/03/2022 0939   LYMPHSABS 2.3 12/15/2019 1710   MONOABS 0.4 06/03/2022 0939   EOSABS 0.3 06/03/2022 0939   EOSABS 0.1 12/15/2019 1710   BASOSABS 0.1 06/03/2022 0939   BASOSABS 0.0 12/15/2019 1710    Chest  Imaging: October 2023 CT chest: Bilateral groundglass opacities The patient's images have been independently reviewed by me.    Pulmonary Functions Testing Results:     No data to display          FeNO:   Pathology:   Echocardiogram:   Heart Catheterization:     Assessment & Plan:     ICD-10-CM   1. Ground glass opacity present on imaging of lung  R91.8 CT Chest Wo Contrast    2. Acute pulmonary edema (HCC)  J81.0     3. Atrial fibrillation with RVR (HCC)  I48.91       Discussion:  This is a 46 year old gentleman who was admitted to the hospital with a heart failure exacerbation and atrial fibrillation.  He had CT imaging with bilateral groundglass opacities.  I suspect this is related to pulmonary edema.  Plan: Patient feels much better at this time. He has continue to try to lose weight. He is on multiple heart failure medications currently following in cardiology clinic. We can get repeat noncontrasted CT scan of the chest in 3 months to ensure resolution of GGO opacities within the chest. Patient is agreeable to this plan. If his CT scan is clean I do not think he will need any additional follow-up with [place] in the future.   Current Outpatient Medications:    albuterol (PROVENTIL HFA;VENTOLIN HFA) 108 (90 Base) MCG/ACT inhaler, Inhale 2 puffs into the lungs every 4 (four) hours as needed for wheezing or shortness of breath., Disp: 1 Inhaler, Rfl: 0   apixaban (ELIQUIS) 5 MG TABS tablet, Take 1 tablet (5 mg total) by mouth 2 (two) times daily., Disp: 60 tablet, Rfl: 1   carvedilol (COREG) 6.25 MG tablet, Take 1 tablet (6.25 mg total) by mouth 2 (two) times daily with a meal.,  Disp: 60 tablet, Rfl: 11   dapagliflozin propanediol (FARXIGA) 10 MG TABS tablet, Take 1 tablet (10 mg total) by mouth daily., Disp: 30 tablet, Rfl: 1   dofetilide (TIKOSYN) 500 MCG capsule, Take 1 capsule (500 mcg total) by mouth 2 (two) times daily., Disp: 60 capsule, Rfl: 11   furosemide  (LASIX) 40 MG tablet, Take 1 tablet (40 mg total) by mouth 2 (two) times daily., Disp: 60 tablet, Rfl: 1   sacubitril-valsartan (ENTRESTO) 49-51 MG, Take 1 tablet by mouth 2 (two) times daily., Disp: 60 tablet, Rfl: 1   spironolactone (ALDACTONE) 25 MG tablet, Take 0.5 tablets (12.5 mg total) by mouth daily., Disp: 15 tablet, Rfl: 1   traMADol (ULTRAM) 50 MG tablet, Take 1 tablet (50 mg total) by mouth every 6 (six) hours as needed. (Patient taking differently: Take 50 mg by mouth daily as needed for moderate pain.), Disp: 20 tablet, Rfl: 0   Garner Nash, DO [facility] 06/28/2022 4:20 PM

## 2022-06-28 NOTE — Patient Instructions (Signed)
Thank you for visiting Dr. Valeta Harms at [facility]. Today we recommend the following:  Orders Placed This Encounter  Procedures   CT Chest Wo Contrast   Return in about 3 months (around 09/28/2022) for with APP.    Please do your part to reduce the spread of COVID-19.

## 2022-06-29 ENCOUNTER — Other Ambulatory Visit (HOSPITAL_COMMUNITY): Payer: Self-pay

## 2022-06-29 ENCOUNTER — Encounter (HOSPITAL_COMMUNITY): Payer: Self-pay | Admitting: Nurse Practitioner

## 2022-06-29 ENCOUNTER — Ambulatory Visit (HOSPITAL_COMMUNITY)
Admit: 2022-06-29 | Discharge: 2022-06-29 | Disposition: A | Discharge status: Home / Self Care | Payer: Commercial Managed Care - PPO | Attending: Nurse Practitioner | Admitting: Nurse Practitioner

## 2022-06-29 VITALS — BP 148/86 | HR 81 | Ht 70.0 in | Wt 333.4 lb

## 2022-06-29 DIAGNOSIS — R600 Localized edema: Secondary | ICD-10-CM | POA: Insufficient documentation

## 2022-06-29 DIAGNOSIS — I110 Hypertensive heart disease with heart failure: Secondary | ICD-10-CM | POA: Insufficient documentation

## 2022-06-29 DIAGNOSIS — D6869 Other thrombophilia: Secondary | ICD-10-CM

## 2022-06-29 DIAGNOSIS — Z7901 Long term (current) use of anticoagulants: Secondary | ICD-10-CM | POA: Insufficient documentation

## 2022-06-29 DIAGNOSIS — R0600 Dyspnea, unspecified: Secondary | ICD-10-CM | POA: Diagnosis not present

## 2022-06-29 DIAGNOSIS — I4891 Unspecified atrial fibrillation: Secondary | ICD-10-CM | POA: Diagnosis present

## 2022-06-29 DIAGNOSIS — I5022 Chronic systolic (congestive) heart failure: Secondary | ICD-10-CM | POA: Insufficient documentation

## 2022-06-29 LAB — CBC
HCT: 52.8 % — ABNORMAL HIGH (ref 39.0–52.0)
Hemoglobin: 17.7 g/dL — ABNORMAL HIGH (ref 13.0–17.0)
MCH: 29.9 pg (ref 26.0–34.0)
MCHC: 33.5 g/dL (ref 30.0–36.0)
MCV: 89.3 fL (ref 80.0–100.0)
Platelets: 222 10*3/uL (ref 150–400)
RBC: 5.91 MIL/uL — ABNORMAL HIGH (ref 4.22–5.81)
RDW: 12.9 % (ref 11.5–15.5)
WBC: 4 10*3/uL (ref 4.0–10.5)
nRBC: 0 % (ref 0.0–0.2)

## 2022-06-29 LAB — BASIC METABOLIC PANEL
Anion gap: 11 (ref 5–15)
BUN: 13 mg/dL (ref 6–20)
CO2: 25 mmol/L (ref 22–32)
Calcium: 9.3 mg/dL (ref 8.9–10.3)
Chloride: 103 mmol/L (ref 98–111)
Creatinine, Ser: 1.46 mg/dL — ABNORMAL HIGH (ref 0.61–1.24)
GFR, Estimated: 59 mL/min — ABNORMAL LOW (ref >60)
Glucose, Bld: 113 mg/dL — ABNORMAL HIGH (ref 70–99)
Potassium: 4 mmol/L (ref 3.5–5.1)
Sodium: 139 mmol/L (ref 135–145)

## 2022-06-29 LAB — MAGNESIUM: Magnesium: 2.1 mg/dL (ref 1.7–2.4)

## 2022-06-29 MED ORDER — DOFETILIDE 500 MCG PO CAPS
500.0000 ug | ORAL_CAPSULE | Freq: Two times a day (BID) | ORAL | 3 refills | Status: DC
  Filled 2022-06-29 – 2022-07-24 (×2): qty 60, 30d supply, fill #0
  Filled 2022-08-23: qty 60, 30d supply, fill #1
  Filled 2022-09-18 (×2): qty 60, 30d supply, fill #2
  Filled 2022-10-16: qty 60, 30d supply, fill #3

## 2022-06-29 NOTE — Patient Instructions (Signed)
Cardioversion scheduled for Wednesday, November 8th  - Arrive at the [facility] and go to admitting at [facility] not eat or drink anything after midnight the night prior to your procedure.  - Take all your morning medication (except diabetic medications) with a sip of water prior to arrival.  - You will not be able to drive home after your procedure.  - Do NOT miss any doses of your blood thinner - if you should miss a dose please notify our office immediately.  - If you feel as if you go back into normal rhythm prior to scheduled cardioversion, please notify our office immediately. If your procedure is canceled in the cardioversion suite you will be charged a cancellation fee.

## 2022-06-29 NOTE — Progress Notes (Signed)
 Primary Care Physician: Moore, Janece, FNP Referring Physician: [facility] f/u  Cardiologist: Dr. Crenshaw    Anthony Delgado is a 46 y.o. male with a h/o  morbid obesity, HTN, ran out of his medication about 3 months ago due to PCP leaving and closing his practice and patient having difficulty establishing care. Presented to [facility] ED with worsening bilateral lower extremity edema, dyspnea, chest tightness.  He was found to be in A-fib with RVR, significantly fluid overloaded and was admitted to the hospital.  He was placed on diltiazem infusion, Eliquis and cardiology was consulted. His weight is usually in the 330s, it was as high as 357 on admission.    He was found to have HFrEF exacerbation and new onset afib with RVR.  He had  TEE cardioversion, but remained in atrial fibrillation for just a few beats with each shock. TEE showed severely reduced EF at 30% with moderate to severe tricuspid regurgitation, rt atrium severely dilated, mild MR.  He was  improved after diuresis and rate control.  Plan was  for outpatient follow up with cardiology for further management.  He is being seen in afib clinic today. He remains in rate controlled afib. He reports feeling much better, not aware of afib at this point. . Weight is stable, no unusual shortness of breath. He noted symptoms for about one month prior to presenting to the ED. He has been told that he snores teribly with apnea noted. He was put on cpap in hospital. He does not smoke, sue drugs or drink alcohol. 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   He is being compliant with meds. No missed eliquis 5 mg bid, he has been on drug for around a week started 05/29/22 prior to TEE which was negative for thrombus.  F/u in afib clinic, 06/20/22. He is here for Tikosyn admit. He remains in rate controlled afib. Normovolemic. No missed anticoagulation or benadryl  use. His needs were reviewed by PharmD and  no contraindicated drugs on board. Qtc  is acceptable for tikosyn use.  F/u in afib clinic, 08/29/21,  one week after Tikosyn load. He did return to [facility] with cardioversion but ERAF. The plan was if he still had afib on return to clinic, to cardiovert again. Despite being in afib, he feels great. No missed anticoagulation.   Today, he denies symptoms of palpitations, chest pain, shortness of breath, orthopnea, PND, lower extremity edema, dizziness, presyncope, syncope, or neurologic sequela. The patient is tolerating medications without difficulties and is otherwise without complaint today.   Past Medical History:  Diagnosis Date   Hypertension    Past Surgical History:  Procedure Laterality Date   CARDIOVERSION N/A 06/01/2022   Procedure: CARDIOVERSION;  Surgeon: Skains, Mark C, MD;  Location: [facility] ENDOSCOPY;  Service: Cardiovascular;  Laterality: N/A;   CARDIOVERSION N/A 06/22/2022   Procedure: CARDIOVERSION;  Surgeon: O'Neal, London Thomas, MD;  Location: [facility] ENDOSCOPY;  Service: Cardiovascular;  Laterality: N/A;   CHOLECYSTECTOMY  2009   TEE WITHOUT CARDIOVERSION N/A 06/01/2022   Procedure: TRANSESOPHAGEAL ECHOCARDIOGRAM (TEE);  Surgeon: Skains, Mark C, MD;  Location: [facility] ENDOSCOPY;  Service: Cardiovascular;  Laterality: N/A;    Current Outpatient Medications  Medication Sig Dispense Refill   albuterol (PROVENTIL HFA;VENTOLIN HFA) 108 (90 Base) MCG/ACT inhaler Inhale 2 puffs into the lungs every 4 (four) hours as needed for wheezing or shortness of breath. 1 Inhaler 0   apixaban (ELIQUIS)   5 MG TABS tablet Take 1 tablet (5 mg total) by mouth 2 (two) times daily. 60 tablet 1   carvedilol (COREG) 6.25 MG tablet Take 1 tablet (6.25 mg total) by mouth 2 (two) times daily with a meal. 60 tablet 11   dapagliflozin propanediol (FARXIGA) 10 MG TABS tablet Take 1 tablet (10 mg total) by mouth daily. 30 tablet 1   dofetilide (TIKOSYN) 500 MCG capsule Take 1 capsule (500 mcg total) by mouth 2 (two) times  daily. 60 capsule 11   furosemide (LASIX) 40 MG tablet Take 1 tablet (40 mg total) by mouth 2 (two) times daily. 60 tablet 1   sacubitril-valsartan (ENTRESTO) 49-51 MG Take 1 tablet by mouth 2 (two) times daily. 60 tablet 1   spironolactone (ALDACTONE) 25 MG tablet Take 0.5 tablets (12.5 mg total) by mouth daily. 15 tablet 1   traMADol (ULTRAM) 50 MG tablet Take 1 tablet (50 mg total) by mouth every 6 (six) hours as needed. (Patient taking differently: Take 50 mg by mouth daily as needed for moderate pain.) 20 tablet 0   No current facility-administered medications for this encounter.    No Known Allergies  Social History   Socioeconomic History   Marital status: Married    Spouse name: Izora Gala   Number of children: 4   Years of education: Not on file   Highest education level: High school graduate  Occupational History   Occupation: Sports coach  Tobacco Use   Smoking status: Never   Smokeless tobacco: Never  Vaping Use   Vaping Use: Never used  Substance and Sexual Activity   Alcohol use: No   Drug use: No   Sexual activity: Yes    Partners: Female  Other Topics Concern   Not on file  Social History Narrative   Not on file   Social Determinants of Health   Financial Resource Strain: Low Risk  (06/01/2022)   Overall Financial Resource Strain (CARDIA)    Difficulty of Paying Living Expenses: Not hard at all  Food Insecurity: No Food Insecurity (06/20/2022)   Hunger Vital Sign    Worried About Running Out of Food in the Last Year: Never true    [place] in the Last Year: Never true  Transportation Needs: No Transportation Needs (06/20/2022)   PRAPARE - Hydrologist (Medical): No    Lack of Transportation (Non-Medical): No  Physical Activity: Not on file  Stress: Not on file  Social Connections: Not on file  Intimate Partner Violence: Not At Risk (06/20/2022)   Humiliation, Afraid, Rape, and Kick questionnaire    Fear of Current or  Ex-Partner: No    Emotionally Abused: No    Physically Abused: No    Sexually Abused: No    Family History  Problem Relation Age of Onset   Cancer Mother    Stroke Neg Hx        None at early ages   CAD Neg Hx        None at early ages    66- All systems are reviewed and negative except as per the HPI above  Physical Exam: Vitals:   06/29/22 1517  BP: (!) 148/86  Pulse: 81  Weight: (!) 151.2 kg  Height: 5\' 10"  (1.778 m)   Wt Readings from Last 3 Encounters:  06/29/22 (!) 151.2 kg  06/28/22 (!) 151.3 kg  06/22/22 (!) 151.7 kg    Labs: Lab Results  Component Value Date  NA 137 06/23/2022   K 3.9 06/23/2022   CL 104 06/23/2022   CO2 26 06/23/2022   GLUCOSE 84 06/23/2022   BUN 11 06/23/2022   CREATININE 1.26 (H) 06/23/2022   CALCIUM 8.8 (L) 06/23/2022   PHOS 5.0 (H) 06/02/2022   MG 2.1 06/23/2022   Lab Results  Component Value Date   INR 0.97 04/02/2013   Lab Results  Component Value Date   CHOL 188 06/14/2022   HDL 31 (L) 06/14/2022   LDLCALC 121 (H) 06/14/2022   TRIG 203 (H) 06/14/2022     GEN- The patient is well appearing, alert and oriented x 3 today.   Head- normocephalic, atraumatic Eyes-  Sclera clear, conjunctiva pink Ears- hearing intact Oropharynx- clear Neck- supple, no JVP Lymph- no cervical lymphadenopathy Lungs- Clear to ausculation bilaterally, normal work of breathing Heart- irregular rate and rhythm, no murmurs, rubs or gallops, PMI not laterally displaced GI- soft, NT, ND, + BS Extremities- no clubbing, cyanosis, or edema MS- no significant deformity or atrophy Skin- no rash or lesion Psych- euthymic mood, full affect Neuro- strength and sensation are intact  EKG- Vent. rate 82 BPM PR interval * ms QRS duration 74 ms QT/QTcB 364/425 ms P-R-T axes * 64 -27 Atrial fibrillation Nonspecific T wave abnormality Abnormal ECG When compared with ECG of 08-Jun-2022 09:21, PREVIOUS ECG IS PRESENT  Echo- Left ventricular  ejection fraction, by estimation, is 35 to 40%. The left ventricle has moderately decreased function. The left ventricle demonstrates global hypokinesis. There is mild concentric left ventricular hypertrophy. Left ventricular diastolic function could not be evaluated. 1.Right ventricular systolic function is low normal. The right ventricular size is mildly enlarged. There is normal pulmonary artery systolic pressure. The estimated right ventricular systolic pressure is [id number] mmHg. 2. 3. Left atrial size was mildly dilated. 4. Right atrial size was severely dilated. 5. The mitral valve is normal in structure. Trivial mitral valve regurgitation. 6. The tricuspid valve is abnormal. Tricuspid valve regurgitation is mild to moderate. 7. The aortic valve is tricuspid. Aortic valve regurgitation is not visualized. Aortic dilatation noted. There is borderline dilatation of the aortic root, measuring 38 mm. 8. The inferior vena cava is dilated in size with >50% respiratory variability, suggesting right atrial pressure of 8 mmHg.  Assessment and Plan:  1. New onset afib  05/29/22 In the setting of HF, LV dysfunction He is now s/p Tikosyn admit, unfortunately had ERAF following cardioversion   Will plan on repeat cardioversion 11/8 Continue carvedilol 6.125 mg bid for rate control.  No benadryl use  He is taking tikosyn bid and [facility]   Qt stable  Bmet/mag/cbc  2. CHA2DS2VASc  score of 3 Continue eliquis 5 mg bid  States no missed doses for the last 3 weeks   3. LV dysfunction Continue entresto, farxiga, lasix, spironolactone Wt stable   Limit salt and liquids    4. Probable sleep apnea Witnessed snoring and apnea  Order sleep study   Can return to work as he feels well   F/u here in one week after cardioversion   Butch Penny C. Renay Crammer, [facility] [address] [place], [place] [address] [phone]

## 2022-06-29 NOTE — H&P (View-Only) (Signed)
Primary Care Physician: Minette Brine, FNP Referring Physician: [facility] f/u  Cardiologist: Dr. Janeann Merl Anthony Delgado is a 46 y.o. male with a h/o  morbid obesity, HTN, ran out of his medication about 3 months ago due to PCP leaving and closing his practice and patient having difficulty establishing care. Presented to [facility] ED with worsening bilateral lower extremity edema, dyspnea, chest tightness.  He was found to be in A-fib with RVR, significantly fluid overloaded and was admitted to the hospital.  He was placed on diltiazem infusion, Eliquis and cardiology was consulted. His weight is usually in the 330s, it was as high as 357 on admission.    He was found to have HFrEF exacerbation and new onset afib with RVR.  He had  TEE cardioversion, but remained in atrial fibrillation for just a few beats with each shock. TEE showed severely reduced EF at 30% with moderate to severe tricuspid regurgitation, rt atrium severely dilated, mild MR.  He was  improved after diuresis and rate control.  Plan was  for outpatient follow up with cardiology for further management.  He is being seen in afib clinic today. He remains in rate controlled afib. He reports feeling much better, not aware of afib at this point. . Weight is stable, no unusual shortness of breath. He noted symptoms for about one month prior to presenting to the ED. He has been told that he snores teribly with apnea noted. He was put on cpap in hospital. He does not smoke, sue drugs or drink alcohol. 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   He is being compliant with meds. No missed eliquis 5 mg bid, he has been on drug for around a week started 05/29/22 prior to TEE which was negative for thrombus.  F/u in afib clinic, 06/20/22. He is here for Tikosyn admit. He remains in rate controlled afib. Normovolemic. No missed anticoagulation or benadryl  use. His needs were reviewed by PharmD and  no contraindicated drugs on board. Qtc  is acceptable for tikosyn use.  F/u in afib clinic, 08/29/21,  one week after Tikosyn load. He did return to SR with cardioversion but ERAF. The plan was if he still had afib on return to clinic, to cardiovert again. Despite being in afib, he feels great. No missed anticoagulation.   Today, he denies symptoms of palpitations, chest pain, shortness of breath, orthopnea, PND, lower extremity edema, dizziness, presyncope, syncope, or neurologic sequela. The patient is tolerating medications without difficulties and is otherwise without complaint today.   Past Medical History:  Diagnosis Date   Hypertension    Past Surgical History:  Procedure Laterality Date   CARDIOVERSION N/A 06/01/2022   Procedure: CARDIOVERSION;  Surgeon: Jerline Pain, MD;  Location: [facility] ENDOSCOPY;  Service: Cardiovascular;  Laterality: N/A;   CARDIOVERSION N/A 06/22/2022   Procedure: CARDIOVERSION;  Surgeon: Geralynn Rile, MD;  Location: [place];  Service: Cardiovascular;  Laterality: N/A;   CHOLECYSTECTOMY  2009   TEE WITHOUT CARDIOVERSION N/A 06/01/2022   Procedure: TRANSESOPHAGEAL ECHOCARDIOGRAM (TEE);  Surgeon: Jerline Pain, MD;  Location: [facility] ENDOSCOPY;  Service: Cardiovascular;  Laterality: N/A;    Current Outpatient Medications  Medication Sig Dispense Refill   albuterol (PROVENTIL HFA;VENTOLIN HFA) 108 (90 Base) MCG/ACT inhaler Inhale 2 puffs into the lungs every 4 (four) hours as needed for wheezing or shortness of breath. 1 Inhaler 0   apixaban (ELIQUIS)  5 MG TABS tablet Take 1 tablet (5 mg total) by mouth 2 (two) times daily. 60 tablet 1   carvedilol (COREG) 6.25 MG tablet Take 1 tablet (6.25 mg total) by mouth 2 (two) times daily with a meal. 60 tablet 11   dapagliflozin propanediol (FARXIGA) 10 MG TABS tablet Take 1 tablet (10 mg total) by mouth daily. 30 tablet 1   dofetilide (TIKOSYN) 500 MCG capsule Take 1 capsule (500 mcg total) by mouth 2 (two) times  daily. 60 capsule 11   furosemide (LASIX) 40 MG tablet Take 1 tablet (40 mg total) by mouth 2 (two) times daily. 60 tablet 1   sacubitril-valsartan (ENTRESTO) 49-51 MG Take 1 tablet by mouth 2 (two) times daily. 60 tablet 1   spironolactone (ALDACTONE) 25 MG tablet Take 0.5 tablets (12.5 mg total) by mouth daily. 15 tablet 1   traMADol (ULTRAM) 50 MG tablet Take 1 tablet (50 mg total) by mouth every 6 (six) hours as needed. (Patient taking differently: Take 50 mg by mouth daily as needed for moderate pain.) 20 tablet 0   No current facility-administered medications for this encounter.    No Known Allergies  Social History   Socioeconomic History   Marital status: Married    Spouse name: Izora Gala   Number of children: 4   Years of education: Not on file   Highest education level: High school graduate  Occupational History   Occupation: Sports coach  Tobacco Use   Smoking status: Never   Smokeless tobacco: Never  Vaping Use   Vaping Use: Never used  Substance and Sexual Activity   Alcohol use: No   Drug use: No   Sexual activity: Yes    Partners: Female  Other Topics Concern   Not on file  Social History Narrative   Not on file   Social Determinants of Health   Financial Resource Strain: Low Risk  (06/01/2022)   Overall Financial Resource Strain (CARDIA)    Difficulty of Paying Living Expenses: Not hard at all  Food Insecurity: No Food Insecurity (06/20/2022)   Hunger Vital Sign    Worried About Running Out of Food in the Last Year: Never true    [place] in the Last Year: Never true  Transportation Needs: No Transportation Needs (06/20/2022)   PRAPARE - Hydrologist (Medical): No    Lack of Transportation (Non-Medical): No  Physical Activity: Not on file  Stress: Not on file  Social Connections: Not on file  Intimate Partner Violence: Not At Risk (06/20/2022)   Humiliation, Afraid, Rape, and Kick questionnaire    Fear of Current or  Ex-Partner: No    Emotionally Abused: No    Physically Abused: No    Sexually Abused: No    Family History  Problem Relation Age of Onset   Cancer Mother    Stroke Neg Hx        None at early ages   CAD Neg Hx        None at early ages    66- All systems are reviewed and negative except as per the HPI above  Physical Exam: Vitals:   06/29/22 1517  BP: (!) 148/86  Pulse: 81  Weight: (!) 151.2 kg  Height: 5\' 10"  (1.778 m)   Wt Readings from Last 3 Encounters:  06/29/22 (!) 151.2 kg  06/28/22 (!) 151.3 kg  06/22/22 (!) 151.7 kg    Labs: Lab Results  Component Value Date  NA 137 06/23/2022   K 3.9 06/23/2022   CL 104 06/23/2022   CO2 26 06/23/2022   GLUCOSE 84 06/23/2022   BUN 11 06/23/2022   CREATININE 1.26 (H) 06/23/2022   CALCIUM 8.8 (L) 06/23/2022   PHOS 5.0 (H) 06/02/2022   MG 2.1 06/23/2022   Lab Results  Component Value Date   INR 0.97 04/02/2013   Lab Results  Component Value Date   CHOL 188 06/14/2022   HDL 31 (L) 06/14/2022   LDLCALC 121 (H) 06/14/2022   TRIG 203 (H) 06/14/2022     GEN- The patient is well appearing, alert and oriented x 3 today.   Head- normocephalic, atraumatic Eyes-  Sclera clear, conjunctiva pink Ears- hearing intact Oropharynx- clear Neck- supple, no JVP Lymph- no cervical lymphadenopathy Lungs- Clear to ausculation bilaterally, normal work of breathing Heart- irregular rate and rhythm, no murmurs, rubs or gallops, PMI not laterally displaced GI- soft, NT, ND, + BS Extremities- no clubbing, cyanosis, or edema MS- no significant deformity or atrophy Skin- no rash or lesion Psych- euthymic mood, full affect Neuro- strength and sensation are intact  EKG- Vent. rate 82 BPM PR interval * ms QRS duration 74 ms QT/QTcB 364/425 ms P-R-T axes * 64 -27 Atrial fibrillation Nonspecific T wave abnormality Abnormal ECG When compared with ECG of 08-Jun-2022 09:21, PREVIOUS ECG IS PRESENT  Echo- Left ventricular  ejection fraction, by estimation, is 35 to 40%. The left ventricle has moderately decreased function. The left ventricle demonstrates global hypokinesis. There is mild concentric left ventricular hypertrophy. Left ventricular diastolic function could not be evaluated. 1.Right ventricular systolic function is low normal. The right ventricular size is mildly enlarged. There is normal pulmonary artery systolic pressure. The estimated right ventricular systolic pressure is [id number] mmHg. 2. 3. Left atrial size was mildly dilated. 4. Right atrial size was severely dilated. 5. The mitral valve is normal in structure. Trivial mitral valve regurgitation. 6. The tricuspid valve is abnormal. Tricuspid valve regurgitation is mild to moderate. 7. The aortic valve is tricuspid. Aortic valve regurgitation is not visualized. Aortic dilatation noted. There is borderline dilatation of the aortic root, measuring 38 mm. 8. The inferior vena cava is dilated in size with >50% respiratory variability, suggesting right atrial pressure of 8 mmHg.  Assessment and Plan:  1. New onset afib  05/29/22 In the setting of HF, LV dysfunction He is now s/p Tikosyn admit, unfortunately had ERAF following cardioversion   Will plan on repeat cardioversion 11/8 Continue carvedilol 6.125 mg bid for rate control.  No benadryl use  He is taking tikosyn bid and [facility]   Qt stable  Bmet/mag/cbc  2. CHA2DS2VASc  score of 3 Continue eliquis 5 mg bid  States no missed doses for the last 3 weeks   3. LV dysfunction Continue entresto, farxiga, lasix, spironolactone Wt stable   Limit salt and liquids    4. Probable sleep apnea Witnessed snoring and apnea  Order sleep study   Can return to work as he feels well   F/u here in one week after cardioversion   Butch Penny C. Tresea Heine, [facility] [address] [place], [place] [address] [phone]

## 2022-06-30 ENCOUNTER — Other Ambulatory Visit (HOSPITAL_COMMUNITY): Payer: Self-pay

## 2022-07-05 ENCOUNTER — Ambulatory Visit (HOSPITAL_BASED_OUTPATIENT_CLINIC_OR_DEPARTMENT_OTHER): Payer: Commercial Managed Care - PPO | Admitting: Anesthesiology

## 2022-07-05 ENCOUNTER — Encounter (HOSPITAL_COMMUNITY): Payer: Self-pay | Admitting: Cardiology

## 2022-07-05 ENCOUNTER — Other Ambulatory Visit: Payer: Self-pay

## 2022-07-05 ENCOUNTER — Ambulatory Visit (HOSPITAL_COMMUNITY): Payer: Commercial Managed Care - PPO | Admitting: Anesthesiology

## 2022-07-05 ENCOUNTER — Ambulatory Visit (HOSPITAL_COMMUNITY)
Admission: RE | Admit: 2022-07-05 | Discharge: 2022-07-05 | Disposition: A | Discharge status: Home / Self Care | Payer: Commercial Managed Care - PPO | Attending: Cardiology | Admitting: Cardiology

## 2022-07-05 ENCOUNTER — Encounter (HOSPITAL_COMMUNITY)
Admission: RE | Disposition: A | Discharge status: Home / Self Care | Payer: Self-pay | Source: Home / Self Care | Attending: Cardiology

## 2022-07-05 DIAGNOSIS — Z79899 Other long term (current) drug therapy: Secondary | ICD-10-CM | POA: Insufficient documentation

## 2022-07-05 DIAGNOSIS — Z6841 Body Mass Index (BMI) 40.0 and over, adult: Secondary | ICD-10-CM | POA: Insufficient documentation

## 2022-07-05 DIAGNOSIS — I5023 Acute on chronic systolic (congestive) heart failure: Secondary | ICD-10-CM | POA: Diagnosis not present

## 2022-07-05 DIAGNOSIS — I110 Hypertensive heart disease with heart failure: Secondary | ICD-10-CM | POA: Diagnosis not present

## 2022-07-05 DIAGNOSIS — I4891 Unspecified atrial fibrillation: Secondary | ICD-10-CM | POA: Insufficient documentation

## 2022-07-05 DIAGNOSIS — I10 Essential (primary) hypertension: Secondary | ICD-10-CM | POA: Diagnosis not present

## 2022-07-05 HISTORY — PX: CARDIOVERSION: SHX1299

## 2022-07-05 SURGERY — CARDIOVERSION
Anesthesia: General

## 2022-07-05 MED ORDER — PROPOFOL 10 MG/ML IV BOLUS
INTRAVENOUS | Status: DC | PRN
  Administered 2022-07-05: 100 mg via INTRAVENOUS

## 2022-07-05 MED ORDER — SODIUM CHLORIDE 0.9 % IV SOLN: INTRAVENOUS | Status: DC

## 2022-07-05 MED ORDER — LIDOCAINE 2% (20 MG/ML) 5 ML SYRINGE
INTRAMUSCULAR | Status: DC | PRN
  Administered 2022-07-05: 100 mg via INTRAVENOUS

## 2022-07-05 NOTE — Transfer of Care (Signed)
Immediate Anesthesia Transfer of Care Note  Patient: Anthony Delgado  Procedure(s) Performed: CARDIOVERSION  Patient Location: Endoscopy Unit  Anesthesia Type:General  Level of Consciousness: awake, alert , and oriented  Airway & Oxygen Therapy: Patient Spontanous Breathing  Post-op Assessment: Report given to RN, Post -op Vital signs reviewed and stable, and Patient moving all extremities X 4  Post vital signs: Reviewed and stable  Last Vitals:  Vitals Value Taken Time  BP 133/96 07/05/22 1156  Temp 36.3 C 07/05/22 1156  Pulse 79 07/05/22 1156  Resp 24 07/05/22 1156  SpO2 97 % 07/05/22 1156  Vitals shown include unvalidated device data.  Last Pain:  Vitals:   07/05/22 1156  TempSrc: Tympanic  PainSc: 0-No pain         Complications: No notable events documented.

## 2022-07-05 NOTE — Interval H&P Note (Signed)
History and Physical Interval Note:  07/05/2022 10:50 AM  Anthony Delgado  has presented today for surgery, with the diagnosis of AFIB.  The various methods of treatment have been discussed with the patient and family. After consideration of risks, benefits and other options for treatment, the patient has consented to  Procedure(s): CARDIOVERSION (N/A) as a surgical intervention.  The patient's history has been reviewed, patient examined, no change in status, stable for surgery.  I have reviewed the patient's chart and labs.  Questions were answered to the patient's satisfaction.     [name]

## 2022-07-05 NOTE — CV Procedure (Signed)
   Electrical Cardioversion Procedure Note Azavion Bouillon [id number] 29-Jun-1976  Procedure: Electrical Cardioversion Indications:  Atrial Fibrillation  Time Out: Verified patient identification, verified procedure,medications/allergies/relevent history reviewed, required imaging and test results available.  Performed  Procedure Details  The patient signed informed consent.   The patient was NPO past midnight. Has had therapeutic anticoagulation with Eliquis greater than 3 weeks. The patient denies any interruption of anticoagulation.  Anesthesia was administered by Dr. Teresa Pelton.  Adequate airway was maintained throughout and vital followed per protocol.  He was cardioverted x 1 with 200J of biphasic synchronized energy.  He converted to NSR.  There were no apparent complications.  The patient tolerated the procedure well and had normal neuro status and respiratory status post procedure with vitals stable as recorded elsewhere.     IMPRESSION:  Successful cardioversion of atrial fibrillation to sinus rhythm.   Follow up:  We will arrange follow up with Primary cardiologist.  He will continue on current medical therapy.  The patient advised to continue anticoagulation.  Efrata Brunner 07/05/2022, 11:51 AM

## 2022-07-05 NOTE — Anesthesia Postprocedure Evaluation (Signed)
Anesthesia Post Note  Patient: Anthony Delgado  Procedure(s) Performed: CARDIOVERSION     Patient location during evaluation: PACU Anesthesia Type: General Level of consciousness: awake and alert Pain management: pain level controlled Vital Signs Assessment: post-procedure vital signs reviewed and stable Respiratory status: spontaneous breathing, nonlabored ventilation, respiratory function stable and patient connected to nasal cannula oxygen Cardiovascular status: blood pressure returned to baseline and stable Postop Assessment: no apparent nausea or vomiting Anesthetic complications: no   No notable events documented.  Last Vitals:  Vitals:   07/05/22 1210 07/05/22 1220  BP: 122/87 125/88  Pulse: 73 71  Resp: 16 (!) 24  Temp:    SpO2: 96% 95%    Last Pain:  Vitals:   07/05/22 1220  TempSrc:   PainSc: 0-No pain                 Earl Lites P Zaidee Rion

## 2022-07-05 NOTE — Anesthesia Preprocedure Evaluation (Signed)
Anesthesia Evaluation  Patient identified by MRN, date of birth, ID band Patient awake    Reviewed: Allergy & Precautions, NPO status , Patient's Chart, lab work & pertinent test results  Airway Mallampati: III  TM Distance: >3 FB Neck ROM: Full    Dental no notable dental hx.    Pulmonary neg pulmonary ROS   Pulmonary exam normal        Cardiovascular hypertension, Pt. on medications and Pt. on home beta blockers + dysrhythmias Atrial Fibrillation  Rhythm:Regular Rate:Normal     Neuro/Psych negative neurological ROS  negative psych ROS   GI/Hepatic negative GI ROS, Neg liver ROS,,,  Endo/Other  negative endocrine ROS    Renal/GU   negative genitourinary   Musculoskeletal negative musculoskeletal ROS (+)    Abdominal Normal abdominal exam  (+)   Peds  Hematology negative hematology ROS (+)   Anesthesia Other Findings   Reproductive/Obstetrics                             Anesthesia Physical Anesthesia Plan  ASA: 3  Anesthesia Plan: General   Post-op Pain Management:    Induction: Intravenous  PONV Risk Score and Plan: 2 and Treatment may vary due to age or medical condition  Airway Management Planned: Mask  Additional Equipment: None  Intra-op Plan:   Post-operative Plan:   Informed Consent: I have reviewed the patients History and Physical, chart, labs and discussed the procedure including the risks, benefits and alternatives for the proposed anesthesia with the patient or authorized representative who has indicated his/her understanding and acceptance.     Dental advisory given  Plan Discussed with: CRNA  Anesthesia Plan Comments:        Anesthesia Quick Evaluation

## 2022-07-05 NOTE — Anesthesia Procedure Notes (Signed)
Procedure Name: General with mask airway Date/Time: 07/05/2022 11:48 AM  Performed by: Quentin Ore, CRNAPre-anesthesia Checklist: Patient identified, Emergency Drugs available, Suction available and Patient being monitored Patient Re-evaluated:Patient Re-evaluated prior to induction Oxygen Delivery Method: Ambu bag Induction Type: IV induction Placement Confirmation: positive ETCO2 Dental Injury: Teeth and Oropharynx as per pre-operative assessment

## 2022-07-05 NOTE — Discharge Instructions (Signed)

## 2022-07-07 ENCOUNTER — Encounter (HOSPITAL_COMMUNITY): Payer: Self-pay | Admitting: Cardiology

## 2022-07-13 ENCOUNTER — Inpatient Hospital Stay (HOSPITAL_COMMUNITY)
Admission: RE | Admit: 2022-07-13 | Payer: Commercial Managed Care - PPO | Source: Ambulatory Visit | Admitting: Nurse Practitioner

## 2022-07-13 NOTE — Progress Notes (Incomplete)
Primary Care Physician: Minette Brine, FNP Referring Physician: [facility] f/u  Cardiologist: Dr. Janeann Merl Elden is a 46 y.o. male with a h/o  morbid obesity, HTN, ran out of his medication about 3 months ago due to PCP leaving and closing his practice and patient having difficulty establishing care. Presented to [facility] ED with worsening bilateral lower extremity edema, dyspnea, chest tightness.  He was found to be in A-fib with RVR, significantly fluid overloaded and was admitted to the hospital.  He was placed on diltiazem infusion, Eliquis and cardiology was consulted. His weight is usually in the 330s, it was as high as 357 on admission.    He was found to have HFrEF exacerbation and new onset afib with RVR.  He had  TEE cardioversion, but remained in atrial fibrillation for just a few beats with each shock. TEE showed severely reduced EF at 30% with moderate to severe tricuspid regurgitation, rt atrium severely dilated, mild MR.  He was  improved after diuresis and rate control.  Plan was  for outpatient follow up with cardiology for further management.  He is being seen in afib clinic today. He remains in rate controlled afib. He reports feeling much better, not aware of afib at this point. . Weight is stable, no unusual shortness of breath. He noted symptoms for about one month prior to presenting to the ED. He has been told that he snores teribly with apnea noted. He was put on cpap in hospital. He does not smoke, sue drugs or drink alcohol. 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   He is being compliant with meds. No missed eliquis 5 mg bid, he has been on drug for around a week started 05/29/22 prior to TEE which was negative for thrombus.  F/u in afib clinic, 06/20/22. He is here for Tikosyn admit. He remains in rate controlled afib. Normovolemic. No missed anticoagulation or benadryl  use. His needs were reviewed by PharmD and  no contraindicated drugs on board. Qtc  is acceptable for tikosyn use.  F/u in afib clinic, 08/29/21,  one week after Tikosyn load. He did return to SR with cardioversion but ERAF. The plan was if he still had afib on return to clinic, to cardiovert again. Despite being in afib, he feels great. No missed anticoagulation.   Today, he denies symptoms of palpitations, chest pain, shortness of breath, orthopnea, PND, lower extremity edema, dizziness, presyncope, syncope, or neurologic sequela. The patient is tolerating medications without difficulties and is otherwise without complaint today.   Past Medical History:  Diagnosis Date   Hypertension    Past Surgical History:  Procedure Laterality Date   CARDIOVERSION N/A 06/01/2022   Procedure: CARDIOVERSION;  Surgeon: Jerline Pain, MD;  Location: [facility] ENDOSCOPY;  Service: Cardiovascular;  Laterality: N/A;   CARDIOVERSION N/A 06/22/2022   Procedure: CARDIOVERSION;  Surgeon: Geralynn Rile, MD;  Location: [place];  Service: Cardiovascular;  Laterality: N/A;   CARDIOVERSION N/A 07/05/2022   Procedure: CARDIOVERSION;  Surgeon: Berniece Salines, DO;  Location: [place] ENDOSCOPY;  Service: Cardiovascular;  Laterality: N/A;   CHOLECYSTECTOMY  2009   TEE WITHOUT CARDIOVERSION N/A 06/01/2022   Procedure: TRANSESOPHAGEAL ECHOCARDIOGRAM (TEE);  Surgeon: Jerline Pain, MD;  Location: [facility] ENDOSCOPY;  Service: Cardiovascular;  Laterality: N/A;    Current Outpatient Medications  Medication Sig Dispense Refill   apixaban (ELIQUIS) 5 MG TABS tablet Take 1 tablet (5  mg total) by mouth 2 (two) times daily. 60 tablet 1   carvedilol (COREG) 6.25 MG tablet Take 1 tablet (6.25 mg total) by mouth 2 (two) times daily with a meal. (Patient taking differently: Take 12.5 mg by mouth 2 (two) times daily with a meal.) 60 tablet 11   dapagliflozin propanediol (FARXIGA) 10 MG TABS tablet Take 1 tablet (10 mg total) by mouth daily. 30 tablet 1   dofetilide (TIKOSYN) 500 MCG  capsule Take 1 capsule (500 mcg total) by mouth 2 (two) times daily. 60 capsule 3   furosemide (LASIX) 40 MG tablet Take 1 tablet (40 mg total) by mouth 2 (two) times daily. 60 tablet 1   sacubitril-valsartan (ENTRESTO) 49-51 MG Take 1 tablet by mouth 2 (two) times daily. 60 tablet 1   spironolactone (ALDACTONE) 25 MG tablet Take 0.5 tablets (12.5 mg total) by mouth daily. 15 tablet 1   No current facility-administered medications for this encounter.    No Known Allergies  Social History   Socioeconomic History   Marital status: Married    Spouse name: Izora Gala   Number of children: 4   Years of education: Not on file   Highest education level: High school graduate  Occupational History   Occupation: Sports coach  Tobacco Use   Smoking status: Never   Smokeless tobacco: Never  Vaping Use   Vaping Use: Never used  Substance and Sexual Activity   Alcohol use: No   Drug use: No   Sexual activity: Yes    Partners: Female  Other Topics Concern   Not on file  Social History Narrative   Not on file   Social Determinants of Health   Financial Resource Strain: Low Risk  (06/01/2022)   Overall Financial Resource Strain (CARDIA)    Difficulty of Paying Living Expenses: Not hard at all  Food Insecurity: No Food Insecurity (06/20/2022)   Hunger Vital Sign    Worried About Running Out of Food in the Last Year: Never true    [place] in the Last Year: Never true  Transportation Needs: No Transportation Needs (06/20/2022)   PRAPARE - Hydrologist (Medical): No    Lack of Transportation (Non-Medical): No  Physical Activity: Not on file  Stress: Not on file  Social Connections: Not on file  Intimate Partner Violence: Not At Risk (06/20/2022)   Humiliation, Afraid, Rape, and Kick questionnaire    Fear of Current or Ex-Partner: No    Emotionally Abused: No    Physically Abused: No    Sexually Abused: No    Family History  Problem Relation Age of Onset    Cancer Mother    Stroke Neg Hx        None at early ages   CAD Neg Hx        None at early ages    38- All systems are reviewed and negative except as per the HPI above  Physical Exam: There were no vitals filed for this visit.  Wt Readings from Last 3 Encounters:  07/05/22 (!) 151.5 kg  06/29/22 (!) 151.2 kg  06/28/22 (!) 151.3 kg    Labs: Lab Results  Component Value Date   NA 139 06/29/2022   K 4.0 06/29/2022   CL 103 06/29/2022   CO2 25 06/29/2022   GLUCOSE 113 (H) 06/29/2022   BUN 13 06/29/2022   CREATININE 1.46 (H) 06/29/2022   CALCIUM 9.3 06/29/2022   PHOS 5.0 (H) 06/02/2022  MG 2.1 06/29/2022   Lab Results  Component Value Date   INR 0.97 04/02/2013   Lab Results  Component Value Date   CHOL 188 06/14/2022   HDL 31 (L) 06/14/2022   LDLCALC 121 (H) 06/14/2022   TRIG 203 (H) 06/14/2022     GEN- The patient is well appearing, alert and oriented x 3 today.   Head- normocephalic, atraumatic Eyes-  Sclera clear, conjunctiva pink Ears- hearing intact Oropharynx- clear Neck- supple, no JVP Lymph- no cervical lymphadenopathy Lungs- Clear to ausculation bilaterally, normal work of breathing Heart- irregular rate and rhythm, no murmurs, rubs or gallops, PMI not laterally displaced GI- soft, NT, ND, + BS Extremities- no clubbing, cyanosis, or edema MS- no significant deformity or atrophy Skin- no rash or lesion Psych- euthymic mood, full affect Neuro- strength and sensation are intact  EKG- Vent. rate 82 BPM PR interval * ms QRS duration 74 ms QT/QTcB 364/425 ms P-R-T axes * 64 -27 Atrial fibrillation Nonspecific T wave abnormality Abnormal ECG When compared with ECG of 08-Jun-2022 09:21, PREVIOUS ECG IS PRESENT  Echo- Left ventricular ejection fraction, by estimation, is 35 to 40%. The left ventricle has moderately decreased function. The left ventricle demonstrates global hypokinesis. There is mild concentric left ventricular hypertrophy.  Left ventricular diastolic function could not be evaluated. 1.Right ventricular systolic function is low normal. The right ventricular size is mildly enlarged. There is normal pulmonary artery systolic pressure. The estimated right ventricular systolic pressure is 22.9 mmHg. 2. 3. Left atrial size was mildly dilated. 4. Right atrial size was severely dilated. 5. The mitral valve is normal in structure. Trivial mitral valve regurgitation. 6. The tricuspid valve is abnormal. Tricuspid valve regurgitation is mild to moderate. 7. The aortic valve is tricuspid. Aortic valve regurgitation is not visualized. Aortic dilatation noted. There is borderline dilatation of the aortic root, measuring 38 mm. 8. The inferior vena cava is dilated in size with >50% respiratory variability, suggesting right atrial pressure of 8 mmHg.  Assessment and Plan:  1. New onset afib  05/29/22 In the setting of HF, LV dysfunction He is now s/p Tikosyn admit, unfortunately had ERAF following cardioversion   Will plan on repeat cardioversion 11/8 Continue carvedilol 6.125 mg bid for rate control.  No benadryl use  He is taking tikosyn bid and [facility]   Qt stable  Bmet/mag/cbc  2. CHA2DS2VASc  score of 3 Continue eliquis 5 mg bid  States no missed doses for the last 3 weeks   3. LV dysfunction Continue entresto, farxiga, lasix, spironolactone Wt stable   Limit salt and liquids    4. Probable sleep apnea Witnessed snoring and apnea  Order sleep study   Can return to work as he feels well   F/u here in one week after cardioversion   Lupita Leash C. [facility] [address] [place], [place] [address] [phone]

## 2022-07-24 ENCOUNTER — Other Ambulatory Visit: Payer: Self-pay

## 2022-07-24 ENCOUNTER — Other Ambulatory Visit (HOSPITAL_COMMUNITY): Payer: Self-pay

## 2022-08-03 ENCOUNTER — Encounter: Payer: Self-pay | Admitting: Cardiology

## 2022-08-03 ENCOUNTER — Ambulatory Visit: Payer: Commercial Managed Care - PPO | Attending: Cardiology | Admitting: Cardiology

## 2022-08-03 VITALS — BP 140/88 | HR 88 | Wt 337.4 lb

## 2022-08-03 DIAGNOSIS — I4819 Other persistent atrial fibrillation: Secondary | ICD-10-CM | POA: Diagnosis not present

## 2022-08-03 DIAGNOSIS — D6869 Other thrombophilia: Secondary | ICD-10-CM

## 2022-08-03 DIAGNOSIS — I5020 Unspecified systolic (congestive) heart failure: Secondary | ICD-10-CM | POA: Diagnosis not present

## 2022-08-03 DIAGNOSIS — R030 Elevated blood-pressure reading, without diagnosis of hypertension: Secondary | ICD-10-CM

## 2022-08-03 DIAGNOSIS — Z79899 Other long term (current) drug therapy: Secondary | ICD-10-CM

## 2022-08-03 NOTE — Progress Notes (Signed)
Electrophysiology Office Note   Date:  08/03/2022   ID:  Barrington, Rihn Jan 20, 1976, MRN [id number]  PCP:  Minette Brine, FNP  Cardiologist:  Stanford Breed Primary Electrophysiologist:  Allegra Lai, MD  Chief Complaint: AF   History of Present Illness: Anthony Delgado is a 46 y.o. male who is being seen today for the evaluation of AF at the request of Minette Brine, [place]. Presenting today for electrophysiology evaluation.  H/o morbid obesity and hypertension.  He presented to the hospital in October 2023, was found to have rapid atrial fibrillation with a heart failure exacerbation.  He had a TEE and cardioversion but only remained in sinus rhythm for a few beats.  TEE showed an ejection fraction of 30%.  He improved after diuresis and rate control.  He was subsequently admitted to the hospital late October 2023 for dofetilide load.  He had return to sinus rhythm while admitted. Was seen in AF clinic after discharge, and unfortunately had gone back into atrial fibrillation.  He is status post cardioversion 07/05/2022.  Since that time, he is pretty sure that he has remained in SR. Feels much better now. Sleeping better, able to sleep flat in bed as opposed to having to sleep in chair. Has less problems with edema, clothes fit better and has more energy.   He is diligent about taking eliquis and tikosyn BID, has alarm on his phone and his children call to double remind him.  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   Today, he denies symptoms of palpitations, chest pain, shortness of breath, orthopnea, PND, lower extremity edema, claudication, dizziness, presyncope, syncope, bleeding, or neurologic sequela. The patient is tolerating medications without difficulties.    Past Medical History:  Diagnosis Date   Hypertension    Past Surgical History:  Procedure Laterality Date   CARDIOVERSION N/A 06/01/2022   Procedure: CARDIOVERSION;  Surgeon:  Jerline Pain, MD;  Location: [facility] ENDOSCOPY;  Service: Cardiovascular;  Laterality: N/A;   CARDIOVERSION N/A 06/22/2022   Procedure: CARDIOVERSION;  Surgeon: Geralynn Rile, MD;  Location: [place];  Service: Cardiovascular;  Laterality: N/A;   CARDIOVERSION N/A 07/05/2022   Procedure: CARDIOVERSION;  Surgeon: Berniece Salines, DO;  Location: [place] ENDOSCOPY;  Service: Cardiovascular;  Laterality: N/A;   CHOLECYSTECTOMY  2009   TEE WITHOUT CARDIOVERSION N/A 06/01/2022   Procedure: TRANSESOPHAGEAL ECHOCARDIOGRAM (TEE);  Surgeon: Jerline Pain, MD;  Location: [facility] ENDOSCOPY;  Service: Cardiovascular;  Laterality: N/A;     Current Outpatient Medications  Medication Sig Dispense Refill   carvedilol (COREG) 12.5 MG tablet Take 12.5 mg by mouth 2 (two) times daily.     dapagliflozin propanediol (FARXIGA) 10 MG TABS tablet Take 10 mg by mouth daily.     dofetilide (TIKOSYN) 500 MCG capsule Take 1 capsule (500 mcg total) by mouth 2 (two) times daily. 60 capsule 3   apixaban (ELIQUIS) 5 MG TABS tablet Take 1 tablet (5 mg total) by mouth 2 (two) times daily. 60 tablet 1   furosemide (LASIX) 40 MG tablet Take 1 tablet (40 mg total) by mouth 2 (two) times daily. 60 tablet 1   spironolactone (ALDACTONE) 25 MG tablet Take 0.5 tablets (12.5 mg total) by mouth daily. 15 tablet 1   No current facility-administered medications for this visit.    Allergies:   Patient has no known allergies.   Social History:  The patient  reports that he has never smoked. He has never  used smokeless tobacco. He reports that he does not drink alcohol and does not use drugs.   Family History:  The patient's family history includes Cancer in his mother.    ROS:  Please see the history of present illness.   Otherwise, review of systems is positive for none.   All other systems are reviewed and negative.    PHYSICAL EXAM: VS:  BP (!) 140/88   Pulse 88   Wt (!) 337 lb 6.4 oz (153 kg)   SpO2 96%   BMI 48.41 kg/m  , BMI  Body mass index is 48.41 kg/m. GEN: Well nourished, well developed, in no acute distress  HEENT: normal  Neck: no JVD, carotid bruits, or masses Cardiac: RRR; no murmurs, rubs, or gallops, no edema  Respiratory:  clear to auscultation bilaterally, normal work of breathing GI: soft, nontender, nondistended, + BS MS: no deformity or atrophy  Skin: warm and dry Neuro:  Strength and sensation are intact Psych: euthymic mood, full affect  EKG:  EKG is ordered today. Personal review of the ekg ordered NSR, rate 88bpm. QT by my calculation 448msec, QTC 4103msec. Stable from prior  Recent Labs: 05/29/2022: TSH 3.826 06/02/2022: B Natriuretic Peptide 97.6 06/08/2022: ALT 39 06/29/2022: BUN 13; Creatinine, Ser 1.46; Hemoglobin 17.7; Magnesium 2.1; Platelets 222; Potassium 4.0; Sodium 139    Lipid Panel     Component Value Date/Time   CHOL 188 06/14/2022 1016   TRIG 203 (H) 06/14/2022 1016   HDL 31 (L) 06/14/2022 1016   CHOLHDL 6.1 (H) 06/14/2022 1016   CHOLHDL 6.9 04/03/2013 0515   VLDL 39 04/03/2013 0515   LDLCALC 121 (H) 06/14/2022 1016     Wt Readings from Last 3 Encounters:  08/03/22 (!) 337 lb 6.4 oz (153 kg)  07/05/22 (!) 334 lb (151.5 kg)  06/29/22 (!) 333 lb 6.4 oz (151.2 kg)      Other studies Reviewed: Additional studies/ records that were reviewed today include: TTE 05/30/22  Review of the above records today demonstrates:   1. Left ventricular ejection fraction, by estimation, is 35 to 40%. The left ventricle has moderately decreased function. The left ventricle demonstrates global hypokinesis. There is mild concentric left ventricular hypertrophy. Left ventricular diastolic function could not be evaluated.   2. Right ventricular systolic function is low normal. The right  ventricular size is mildly enlarged. There is normal pulmonary artery systolic pressure. The estimated right ventricular systolic pressure is [id number] mmHg.   3. Left atrial size was mildly dilated.   4.  Right atrial size was severely dilated.   5. The mitral valve is normal in structure. Trivial mitral valve  regurgitation.   6. The tricuspid valve is abnormal. Tricuspid valve regurgitation is mild to moderate.   7. The aortic valve is tricuspid. Aortic valve regurgitation is not visualized.   8. Aortic dilatation noted. There is borderline dilatation of the aortic root, measuring 38 mm.   9. The inferior vena cava is dilated in size with >50% respiratory variability, suggesting right atrial pressure of 8 mmHg.    ASSESSMENT AND PLAN:  1.  Persistent atrial fibrillation:  Sinus on today's EKG Tolerating tikosyn 516mcg well, QTC stable today  AC - eliquis 5mg  BID, appropriately dosed CHA2DS2-VASc Score = 2 [CHF History: 1, HTN History: 1, Diabetes History: 0, Stroke History: 0, Vascular Disease History: 0, Age Score: 0, Gender Score: 0].  Therefore, the patient's annual risk of stroke is 2.2 %.     2. HFrEF  I suspect EF Anthony Delgado improve now that in SR. Appears euvolemic on today's exam. Continue GDMT at current doses.  3. Elevated BP  Slightly elevated in clinic today, 138/84 on recheck.  I've asked him to check BP more regularly at home. He has PCP appt in early 2024 and Anthony Delgado discuss medication adjustments at that time. I've asked him that if BP is significantly elevated at home prior to PCP appt, to reach out  4.  Witnessed apnea: Sleep study pending  5..  Morbid obesity: Lifestyle modification encouraged Body mass index is 48.41 kg/m.   Current medicines are reviewed at length with the patient today.   The patient does not have concerns regarding his medicines.  The following changes were made today:  none  Labs/ tests ordered today include: none Orders Placed This Encounter  Procedures   EKG 12-Lead     Disposition:   FU with AF clinic or Dr. Elberta Fortis 6 month  Signed, Andreana Klingerman Jorja Loa, MD  08/03/2022 5:04 PM     [facility] [address] [place] [address] [phone] (office) [phone] (fax)  I have seen and examined this patient with Sherie Don.  Agree with above, note added to reflect my findings.  Since being loaded on dofetilide, he has been in normal rhythm.  He feels much improved with less weakness, fatigue, shortness of breath.  He remains able to do all of his daily activities.  GEN: Well nourished, well developed, in no acute distress  HEENT: normal  Neck: no JVD, carotid bruits, or masses Cardiac: RRR; no murmurs, rubs, or gallops,no edema  Respiratory:  clear to auscultation bilaterally, normal work of breathing GI: soft, nontender, nondistended, + BS MS: no deformity or atrophy  Skin: warm and dry Neuro:  Strength and sensation are intact Psych: euthymic mood, full affect   Persistent atrial fibrillation: Currently on Eliquis 5 mg twice daily.  CHA2DS2-VASc of 2.  On dofetilide 500 mcg twice daily.  Remains in sinus rhythm.  No changes. Secondary hypercoagulable state: Currently on Eliquis for atrial fibrillation as above High risk medication monitoring: Currently on dofetilide.  QTc remained stable. Chronic systolic heart failure: Ejection fraction reduced.  No obvious volume overload.  Tabitha Tupper M. Srija Southard MD 08/03/2022 5:04 PM

## 2022-08-03 NOTE — Patient Instructions (Addendum)
Medication Instructions:  Your physician recommends that you continue on your current medications as directed. Please refer to the Current Medication list given to you today.  *If you need a refill on your cardiac medications before your next appointment, please call your pharmacy*   Lab Work: None ordered   Testing/Procedures: None ordered   Follow-Up: At [facility], you and your health needs are our priority.  As part of our continuing mission to provide you with exceptional heart care, we have created designated Provider Care Teams.  These Care Teams include your primary Cardiologist (physician) and Advanced Practice Providers (APPs -  Physician Assistants and Nurse Practitioners) who all work together to provide you with the care you need, when you need it.  Your next appointment:   6 month(s)  The format for your next appointment:   In Person  Provider:   Will Camnitz, MD    Thank you for choosing [facility]!!   Zhane Bluitt, RN [phone]  Other Instructions   Important Information About Sugar           

## 2022-08-09 ENCOUNTER — Other Ambulatory Visit: Payer: Self-pay | Admitting: Nurse Practitioner

## 2022-08-14 ENCOUNTER — Other Ambulatory Visit (HOSPITAL_COMMUNITY): Payer: Self-pay

## 2022-08-14 ENCOUNTER — Other Ambulatory Visit: Payer: Self-pay

## 2022-08-14 ENCOUNTER — Other Ambulatory Visit: Payer: Self-pay | Admitting: Nurse Practitioner

## 2022-08-14 MED ORDER — FUROSEMIDE 40 MG PO TABS
40.0000 mg | ORAL_TABLET | Freq: Two times a day (BID) | ORAL | 1 refills | Status: DC
  Filled 2022-08-14: qty 60, 30d supply, fill #0
  Filled 2022-09-08: qty 60, 30d supply, fill #1

## 2022-08-14 MED ORDER — SPIRONOLACTONE 25 MG PO TABS
12.5000 mg | ORAL_TABLET | Freq: Every day | ORAL | 1 refills | Status: DC
  Filled 2022-08-14: qty 15, 30d supply, fill #0
  Filled 2022-09-08: qty 15, 30d supply, fill #1

## 2022-08-14 MED ORDER — APIXABAN 5 MG PO TABS
5.0000 mg | ORAL_TABLET | Freq: Two times a day (BID) | ORAL | 0 refills | Status: DC
  Filled 2022-08-14: qty 60, 30d supply, fill #0

## 2022-08-14 MED ORDER — DAPAGLIFLOZIN PROPANEDIOL 10 MG PO TABS
10.0000 mg | ORAL_TABLET | Freq: Every day | ORAL | 1 refills | Status: DC
  Filled 2022-08-14: qty 30, 30d supply, fill #0
  Filled 2022-09-18 (×2): qty 30, 30d supply, fill #1
  Filled 2022-11-11: qty 30, 30d supply, fill #2
  Filled 2022-12-19: qty 30, 30d supply, fill #3
  Filled 2023-01-18: qty 30, 30d supply, fill #4
  Filled 2023-02-20: qty 30, 30d supply, fill #5

## 2022-08-14 MED ORDER — DAPAGLIFLOZIN PROPANEDIOL 10 MG PO TABS
10.0000 mg | ORAL_TABLET | Freq: Every day | ORAL | 1 refills | Status: DC
  Filled 2022-08-14: qty 30, 30d supply, fill #0

## 2022-09-05 NOTE — Telephone Encounter (Signed)
[facility]-error.  

## 2022-09-08 ENCOUNTER — Other Ambulatory Visit: Payer: Self-pay | Admitting: Nurse Practitioner

## 2022-09-08 ENCOUNTER — Other Ambulatory Visit (HOSPITAL_COMMUNITY): Payer: Self-pay

## 2022-09-08 MED ORDER — APIXABAN 5 MG PO TABS
5.0000 mg | ORAL_TABLET | Freq: Two times a day (BID) | ORAL | 0 refills | Status: DC
  Filled 2022-09-08: qty 60, 30d supply, fill #0

## 2022-09-08 MED ORDER — CARVEDILOL 12.5 MG PO TABS
12.5000 mg | ORAL_TABLET | Freq: Two times a day (BID) | ORAL | 0 refills | Status: DC
  Filled 2022-09-08: qty 60, 30d supply, fill #0
  Filled 2022-10-15: qty 60, 30d supply, fill #1
  Filled 2022-11-11: qty 60, 30d supply, fill #2

## 2022-09-18 ENCOUNTER — Other Ambulatory Visit (HOSPITAL_COMMUNITY): Payer: Self-pay

## 2022-09-18 ENCOUNTER — Other Ambulatory Visit: Payer: Self-pay

## 2022-10-04 ENCOUNTER — Ambulatory Visit
Admission: RE | Admit: 2022-10-04 | Discharge: 2022-10-04 | Disposition: A | Discharge status: Home / Self Care | Payer: Commercial Managed Care - PPO | Source: Ambulatory Visit | Attending: Pulmonary Disease | Admitting: Pulmonary Disease

## 2022-10-04 DIAGNOSIS — R918 Other nonspecific abnormal finding of lung field: Secondary | ICD-10-CM

## 2022-10-05 ENCOUNTER — Encounter (HOSPITAL_COMMUNITY): Payer: Self-pay | Admitting: *Deleted

## 2022-10-09 NOTE — Progress Notes (Signed)
Tay,   Please let patient know that I have reviewed his CT chest. His nodules are stable. He will need a repeat ct chest in 24 months (2 years).   Please place order for ct chest wo contrast in 2 years. And a call back for appt to review results after ct chest.   Thanks,  BLI  Garner Nash, DO [facility] 10/09/2022 5:15 PM

## 2022-10-15 ENCOUNTER — Other Ambulatory Visit: Payer: Self-pay | Admitting: Nurse Practitioner

## 2022-10-16 ENCOUNTER — Other Ambulatory Visit: Payer: Self-pay

## 2022-10-16 ENCOUNTER — Other Ambulatory Visit (HOSPITAL_COMMUNITY): Payer: Self-pay

## 2022-10-16 MED ORDER — APIXABAN 5 MG PO TABS
5.0000 mg | ORAL_TABLET | Freq: Two times a day (BID) | ORAL | 0 refills | Status: DC
  Filled 2022-10-16: qty 60, 30d supply, fill #0

## 2022-10-16 MED ORDER — SPIRONOLACTONE 25 MG PO TABS
12.5000 mg | ORAL_TABLET | Freq: Every day | ORAL | 1 refills | Status: DC
  Filled 2022-10-16: qty 15, 30d supply, fill #0
  Filled 2022-11-11: qty 15, 30d supply, fill #1

## 2022-10-16 MED ORDER — FUROSEMIDE 40 MG PO TABS
40.0000 mg | ORAL_TABLET | Freq: Two times a day (BID) | ORAL | 1 refills | Status: DC
  Filled 2022-10-16: qty 60, 30d supply, fill #0
  Filled 2022-11-11: qty 60, 30d supply, fill #1

## 2022-10-23 ENCOUNTER — Other Ambulatory Visit: Payer: Self-pay

## 2022-10-23 DIAGNOSIS — R918 Other nonspecific abnormal finding of lung field: Secondary | ICD-10-CM

## 2022-10-30 ENCOUNTER — Ambulatory Visit (INDEPENDENT_AMBULATORY_CARE_PROVIDER_SITE_OTHER): Payer: Commercial Managed Care - PPO | Admitting: Nurse Practitioner

## 2022-10-30 ENCOUNTER — Encounter: Payer: Self-pay | Admitting: Nurse Practitioner

## 2022-10-30 VITALS — BP 158/70 | HR 82 | Temp 98.6°F | Ht 70.0 in | Wt 345.0 lb

## 2022-10-30 DIAGNOSIS — N289 Disorder of kidney and ureter, unspecified: Secondary | ICD-10-CM

## 2022-10-30 DIAGNOSIS — I4891 Unspecified atrial fibrillation: Secondary | ICD-10-CM | POA: Diagnosis not present

## 2022-10-30 DIAGNOSIS — Z0000 Encounter for general adult medical examination without abnormal findings: Secondary | ICD-10-CM

## 2022-10-30 DIAGNOSIS — I110 Hypertensive heart disease with heart failure: Secondary | ICD-10-CM | POA: Diagnosis not present

## 2022-10-30 DIAGNOSIS — E7800 Pure hypercholesterolemia, unspecified: Secondary | ICD-10-CM

## 2022-10-30 DIAGNOSIS — Z125 Encounter for screening for malignant neoplasm of prostate: Secondary | ICD-10-CM

## 2022-10-30 DIAGNOSIS — I5020 Unspecified systolic (congestive) heart failure: Secondary | ICD-10-CM | POA: Diagnosis not present

## 2022-10-30 NOTE — Progress Notes (Signed)
I,Sheena H Holbrook,acting as a Education administrator for Minette Brine, FNP.,have documented all relevant documentation on the behalf of Minette Brine, FNP,as directed by  Minette Brine, FNP while in the presence of Minette Brine, [place].   Subjective:     Patient ID: Anthony Delgado , male    DOB: Jan 14, 1976 , 47 y.o.   MRN: [id number]   Chief Complaint  Patient presents with   Annual Exam    HPI  Patient presents today for annual exam. Patient has no other complaints or concerns. Patient never received Cologuard kit.    Wt Readings from Last 3 Encounters: 10/30/22 : (!) 345 lb (156.5 kg) 08/03/22 : (!) 337 lb 6.4 oz (153 kg) 07/05/22 : (!) 334 lb (151.5 kg)        Past Medical History:  Diagnosis Date   Hypertension      Family History  Problem Relation Age of Onset   Cancer Mother    Stroke Neg Hx        None at early ages   CAD Neg Hx        None at early ages     Current Outpatient Medications:    apixaban (ELIQUIS) 5 MG TABS tablet, Take 1 tablet (5 mg total) by mouth 2 (two) times daily., Disp: 60 tablet, Rfl: 0   carvedilol (COREG) 12.5 MG tablet, Take 1 tablet (12.5 mg total) by mouth 2 (two) times daily., Disp: 180 tablet, Rfl: 0   dapagliflozin propanediol (FARXIGA) 10 MG TABS tablet, Take 1 tablet (10 mg total) by mouth daily., Disp: 90 tablet, Rfl: 1   dofetilide (TIKOSYN) 500 MCG capsule, Take 1 capsule (500 mcg total) by mouth 2 (two) times daily., Disp: 60 capsule, Rfl: 3   furosemide (LASIX) 40 MG tablet, Take 1 tablet (40 mg total) by mouth 2 (two) times daily., Disp: 60 tablet, Rfl: 1   spironolactone (ALDACTONE) 25 MG tablet, Take 0.5 tablets (12.5 mg total) by mouth daily., Disp: 15 tablet, Rfl: 1   No Known Allergies   Men's preventive visit. Patient Health Questionnaire (PHQ-2) is  [place] Office Visit from 10/30/2022 in [facility]  PHQ-2 Total Score 0      Patient is on a low salt diet. Exercising - with work - works  at [employer] Outside of work walks 2 times a week for 3 miles. Marital status: Married. Relevant history for alcohol use is:  Social History   Substance and Sexual Activity  Alcohol Use No  Relevant history for tobacco use is:  Social History   Tobacco Use  Smoking Status Never  Smokeless Tobacco Never  .   Review of Systems  Constitutional: Negative.   HENT: Negative.    Eyes: Negative.   Respiratory: Negative.    Cardiovascular: Negative.   Gastrointestinal: Negative.   Endocrine: Negative.   Genitourinary: Negative.   Musculoskeletal: Negative.   Allergic/Immunologic: Negative.   Neurological: Negative.   Hematological: Negative.   Psychiatric/Behavioral: Negative.       Today's Vitals   10/30/22 0923  BP: (!) 162/108  Pulse: 82  Temp: 98.6 F (37 C)  TempSrc: Oral  SpO2: 95%  Weight: (!) 345 lb (156.5 kg)  Height: '5\' 10"'$  (1.778 m)   Body mass index is 49.5 kg/m.   Objective:  Physical Exam Vitals reviewed.  Constitutional:      General: He is not in acute distress.    Appearance: Normal appearance. He is obese.  HENT:  Head: Normocephalic.     Right Ear: Tympanic membrane, ear canal and external ear normal. There is no impacted cerumen.     Left Ear: Tympanic membrane, ear canal and external ear normal. There is no impacted cerumen.     Nose: Nose normal.     Mouth/Throat:     Mouth: Mucous membranes are moist.  Eyes:     General:        Right eye: No discharge.     Extraocular Movements: Extraocular movements intact.     Conjunctiva/sclera: Conjunctivae normal.     Pupils: Pupils are equal, round, and reactive to light.  Cardiovascular:     Rate and Rhythm: Normal rate and regular rhythm.     Pulses: Normal pulses.     Heart sounds: Normal heart sounds. No murmur heard. Pulmonary:     Effort: Pulmonary effort is normal. No respiratory distress.     Breath sounds: Normal breath sounds. No wheezing.  Abdominal:     General: Abdomen is flat.  Bowel sounds are normal. There is no distension.     Palpations: Abdomen is soft.     Tenderness: There is no abdominal tenderness.  Genitourinary:    Rectum: Guaiac result negative.     Comments: Difficult feeling the prostate, will check levels as well.  Musculoskeletal:        General: No swelling or tenderness.     Cervical back: Normal range of motion and neck supple. No rigidity.     Right lower leg: Edema (1+) present.     Left lower leg: Edema (1+) present.  Skin:    General: Skin is warm and dry.     Capillary Refill: Capillary refill takes less than 2 seconds.  Neurological:     General: No focal deficit present.     Mental Status: He is alert and oriented to person, place, and time.     Cranial Nerves: No cranial nerve deficit.     Motor: No weakness.  Psychiatric:        Mood and Affect: Mood normal.        Behavior: Behavior normal.        Thought Content: Thought content normal.        Judgment: Judgment normal.         Assessment And Plan:    1. Encounter for health maintenance examination Behavior modifications discussed and diet history reviewed.   Pt will continue to exercise regularly and modify diet with low GI, plant based foods and decrease intake of processed foods.  Recommend intake of daily multivitamin, Vitamin D, and calcium.  Recommend mammogram and colonoscopy for preventive screenings, as well as recommend immunizations that include influenza, TDAP, and covid vaccine (will update from [facility]). Will consider the most recent covid vaccine once received.  - CBC - Hemoglobin A1c  2. Prostate cancer screening Difficult to palpate prostate due to anatomy. Negative guaic.  - PSA  3. HFrEF (heart failure with reduced ejection fraction) ([place]) Comments: He reports he has had a more than 3lb weight gain in the last 3-4 days. Advised for him to contact Cardiology for any changes to medications. No dyspnea noted.  4. Atrial fibrillation with RVR  (HCC) Comments: Continue current medications.  5. Elevated LDL cholesterol level Comments: Cholesterol levels have been elevated, will recheck levels pending results will need to start statin. - Lipid panel  6. Renal insufficiency Comments: Will recheck levels. - CMP14+EGFR  7. 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 Encouraged to increase physical activity to at least 3 days a week. EKG done with Cardiology   Patient was given opportunity to ask questions. Patient verbalized understanding of the plan and was able to repeat key elements of the plan. All questions were answered to their satisfaction.   Minette Brine, FNP   I, Minette Brine, FNP, have reviewed all documentation for this visit. The documentation on 10/30/22 for the exam, diagnosis, procedures, and orders are all accurate and complete.  THE PATIENT IS ENCOURAGED TO PRACTICE SOCIAL DISTANCING DUE TO THE COVID-19 PANDEMIC.

## 2022-10-31 LAB — CBC
Hematocrit: 48.5 % (ref 37.5–51.0)
Hemoglobin: 16.5 g/dL (ref 13.0–17.7)
MCH: 30.7 pg (ref 26.6–33.0)
MCHC: 34 g/dL (ref 31.5–35.7)
MCV: 90 fL (ref 79–97)
Platelets: 274 10*3/uL (ref 150–450)
RBC: 5.37 x10E6/uL (ref 4.14–5.80)
RDW: 13.4 % (ref 11.6–15.4)
WBC: 5.8 10*3/uL (ref 3.4–10.8)

## 2022-10-31 LAB — LIPID PANEL
Chol/HDL Ratio: 5.7 ratio — ABNORMAL HIGH (ref 0.0–5.0)
Cholesterol, Total: 198 mg/dL (ref 100–199)
HDL: 35 mg/dL — ABNORMAL LOW (ref >39)
LDL Chol Calc (NIH): 133 mg/dL — ABNORMAL HIGH (ref 0–99)
Triglycerides: 167 mg/dL — ABNORMAL HIGH (ref 0–149)
VLDL Cholesterol Cal: 30 mg/dL (ref 5–40)

## 2022-10-31 LAB — CMP14+EGFR
ALT: 25 IU/L (ref 0–44)
AST: 32 IU/L (ref 0–40)
Albumin/Globulin Ratio: 1.2 (ref 1.2–2.2)
Albumin: 4.2 g/dL (ref 4.1–5.1)
Alkaline Phosphatase: 79 IU/L (ref 44–121)
BUN/Creatinine Ratio: 11 (ref 9–20)
BUN: 13 mg/dL (ref 6–24)
Bilirubin Total: 0.4 mg/dL (ref 0.0–1.2)
CO2: 25 mmol/L (ref 20–29)
Calcium: 9.3 mg/dL (ref 8.7–10.2)
Chloride: 100 mmol/L (ref 96–106)
Creatinine, Ser: 1.22 mg/dL (ref 0.76–1.27)
Globulin, Total: 3.5 g/dL (ref 1.5–4.5)
Glucose: 91 mg/dL (ref 70–99)
Potassium: 4.4 mmol/L (ref 3.5–5.2)
Sodium: 140 mmol/L (ref 134–144)
Total Protein: 7.7 g/dL (ref 6.0–8.5)
eGFR: 73 mL/min/{1.73_m2} (ref >59)

## 2022-10-31 LAB — HEMOGLOBIN A1C
Est. average glucose Bld gHb Est-mCnc: 123 mg/dL
Hgb A1c MFr Bld: 5.9 % — ABNORMAL HIGH (ref 4.8–5.6)

## 2022-10-31 LAB — PSA: Prostate Specific Ag, Serum: 0.3 ng/mL (ref 0.0–4.0)

## 2022-11-11 ENCOUNTER — Other Ambulatory Visit: Payer: Self-pay | Admitting: Nurse Practitioner

## 2022-11-13 ENCOUNTER — Other Ambulatory Visit (HOSPITAL_COMMUNITY): Payer: Self-pay

## 2022-11-13 ENCOUNTER — Other Ambulatory Visit: Payer: Self-pay

## 2022-11-13 MED ORDER — APIXABAN 5 MG PO TABS
5.0000 mg | ORAL_TABLET | Freq: Two times a day (BID) | ORAL | 0 refills | Status: DC
  Filled 2022-11-13: qty 60, 30d supply, fill #0

## 2022-11-14 ENCOUNTER — Other Ambulatory Visit (HOSPITAL_COMMUNITY): Payer: Self-pay

## 2022-11-15 ENCOUNTER — Other Ambulatory Visit: Payer: Self-pay

## 2022-11-15 DIAGNOSIS — R911 Solitary pulmonary nodule: Secondary | ICD-10-CM

## 2022-11-22 ENCOUNTER — Other Ambulatory Visit (HOSPITAL_COMMUNITY): Payer: Self-pay

## 2022-11-22 ENCOUNTER — Other Ambulatory Visit (HOSPITAL_COMMUNITY): Payer: Self-pay | Admitting: Nurse Practitioner

## 2022-11-22 MED ORDER — DOFETILIDE 500 MCG PO CAPS
500.0000 ug | ORAL_CAPSULE | Freq: Two times a day (BID) | ORAL | 2 refills | Status: DC
  Filled 2022-11-22: qty 60, 30d supply, fill #0
  Filled 2022-12-15: qty 60, 30d supply, fill #1
  Filled 2023-01-18: qty 60, 30d supply, fill #2
  Filled 2023-02-20: qty 60, 30d supply, fill #3
  Filled 2023-03-22: qty 60, 30d supply, fill #4
  Filled 2023-04-24: qty 60, 30d supply, fill #5
  Filled 2023-05-23: qty 60, 30d supply, fill #6
  Filled 2023-06-20: qty 60, 30d supply, fill #7
  Filled 2023-07-19 – 2023-07-25 (×3): qty 60, 30d supply, fill #8

## 2022-12-15 ENCOUNTER — Other Ambulatory Visit: Payer: Self-pay | Admitting: Nurse Practitioner

## 2022-12-18 ENCOUNTER — Other Ambulatory Visit (HOSPITAL_COMMUNITY): Payer: Self-pay

## 2022-12-19 ENCOUNTER — Other Ambulatory Visit (HOSPITAL_COMMUNITY): Payer: Self-pay

## 2022-12-19 MED ORDER — CARVEDILOL 12.5 MG PO TABS
12.5000 mg | ORAL_TABLET | Freq: Two times a day (BID) | ORAL | 0 refills | Status: DC
  Filled 2022-12-19: qty 60, 30d supply, fill #0
  Filled 2023-01-18: qty 60, 30d supply, fill #1
  Filled 2023-02-20: qty 60, 30d supply, fill #2

## 2022-12-19 MED ORDER — SPIRONOLACTONE 25 MG PO TABS
12.5000 mg | ORAL_TABLET | Freq: Every day | ORAL | 1 refills | Status: DC
  Filled 2022-12-19: qty 15, 30d supply, fill #0
  Filled 2023-01-18: qty 15, 30d supply, fill #1

## 2022-12-19 MED ORDER — FUROSEMIDE 40 MG PO TABS
40.0000 mg | ORAL_TABLET | Freq: Two times a day (BID) | ORAL | 1 refills | Status: DC
  Filled 2022-12-19: qty 60, 30d supply, fill #0
  Filled 2023-01-18: qty 60, 30d supply, fill #1

## 2022-12-19 MED ORDER — APIXABAN 5 MG PO TABS
5.0000 mg | ORAL_TABLET | Freq: Two times a day (BID) | ORAL | 0 refills | Status: DC
  Filled 2022-12-19: qty 60, 30d supply, fill #0

## 2023-01-02 ENCOUNTER — Other Ambulatory Visit (HOSPITAL_COMMUNITY): Payer: Self-pay

## 2023-01-03 ENCOUNTER — Other Ambulatory Visit (HOSPITAL_COMMUNITY): Payer: Self-pay

## 2023-01-18 ENCOUNTER — Other Ambulatory Visit (HOSPITAL_COMMUNITY): Payer: Self-pay

## 2023-01-18 ENCOUNTER — Other Ambulatory Visit: Payer: Self-pay

## 2023-01-18 ENCOUNTER — Other Ambulatory Visit: Payer: Self-pay | Admitting: Nurse Practitioner

## 2023-01-19 ENCOUNTER — Other Ambulatory Visit (HOSPITAL_COMMUNITY): Payer: Self-pay

## 2023-01-19 MED ORDER — APIXABAN 5 MG PO TABS
5.0000 mg | ORAL_TABLET | Freq: Two times a day (BID) | ORAL | 0 refills | Status: DC
  Filled 2023-01-19: qty 60, 30d supply, fill #0

## 2023-02-07 ENCOUNTER — Encounter: Payer: Self-pay | Admitting: Cardiology

## 2023-02-07 ENCOUNTER — Ambulatory Visit: Payer: Commercial Managed Care - PPO | Attending: Cardiology | Admitting: Cardiology

## 2023-02-07 VITALS — BP 128/96 | HR 80 | Ht 70.0 in | Wt 344.6 lb

## 2023-02-07 DIAGNOSIS — I4819 Other persistent atrial fibrillation: Secondary | ICD-10-CM

## 2023-02-07 DIAGNOSIS — D6869 Other thrombophilia: Secondary | ICD-10-CM

## 2023-02-07 DIAGNOSIS — I5022 Chronic systolic (congestive) heart failure: Secondary | ICD-10-CM

## 2023-02-07 NOTE — Progress Notes (Signed)
  Electrophysiology Office Note:   Date:  02/07/2023  ID:  Anthony Delgado, DOB [birth year], MRN [id number]  Primary Cardiologist: Olga Millers, MD Electrophysiologist: Verlie Hellenbrand Jorja Loa, MD      History of Present Illness:   Anthony Delgado is a 47 y.o. male with h/o atrial fibrillation seen today for routine electrophysiology followup.  Since last being seen in our clinic the patient reports doing reports well.  He is had no further episodes of atrial fibrillation.  He is getting ready to start coaching again, and wishes to work out more.  he denies chest pain, palpitations, dyspnea, PND, orthopnea, nausea, vomiting, dizziness, syncope, edema, weight gain, or early satiety.   Review of systems complete and found to be negative unless listed in HPI.   Studies Reviewed:    EKG is ordered today. Personal review shows sinus rhythm   Risk Assessment/Calculations:    CHA2DS2-VASc Score = 2   This indicates a 2.2% annual risk of stroke. The patient's score is based upon: CHF History: 1 HTN History: 1 Diabetes History: 0 Stroke History: 0 Vascular Disease History: 0 Age Score: 0 Gender Score: 0  Physical Exam:   VS:  BP (!) 128/96   Pulse 80   Ht 5\' 10"  (1.778 m)   Wt (!) 344 lb 9.6 oz (156.3 kg)   SpO2 94%   BMI 49.44 kg/m    Wt Readings from Last 3 Encounters:  02/07/23 (!) 344 lb 9.6 oz (156.3 kg)  10/30/22 (!) 345 lb (156.5 kg)  08/03/22 (!) 337 lb 6.4 oz (153 kg)     GEN: Well nourished, well developed in no acute distress NECK: No JVD; No carotid bruits CARDIAC: Regular rate and rhythm, no murmurs, rubs, gallops RESPIRATORY:  Clear to auscultation without rales, wheezing or rhonchi  ABDOMEN: Soft, non-tender, non-distended EXTREMITIES:  No edema; No deformity   ASSESSMENT AND PLAN:    1.  Persistent atrial fibrillation: Currently on dofetilide.  QTc remained stable.  Remains in sinus rhythm.  2.  Chronic systolic heart failure: Ejection fraction reduced.   Currently on medical therapy.  Now that he is in sinus rhythm, Likisha Alles plan for echo prior to his next visit.  3.  Morbid obesity: Lifestyle modification encouraged  4.  Secondary hypercoagulable state: Currently on Eliquis for atrial fibrillation    Follow up with EP APP in 6 months  Signed, Ritchard Paragas Jorja Loa, MD

## 2023-02-07 NOTE — Patient Instructions (Signed)
Medication Instructions:  Your physician recommends that you continue on your current medications as directed. Please refer to the Current Medication list given to you today.  *If you need a refill on your cardiac medications before your next appointment, please call your pharmacy*   Lab Work: None ordered If you have labs (blood work) drawn today and your tests are completely normal, you will receive your results only by: MyChart Message (if you have MyChart) OR A paper copy in the mail If you have any lab test that is abnormal or we need to change your treatment, we will call you to review the results.   Testing/Procedures: None ordered   Follow-Up: At [facility], you and your health needs are our priority.  As part of our continuing mission to provide you with exceptional heart care, we have created designated Provider Care Teams.  These Care Teams include your primary Cardiologist (physician) and Advanced Practice Providers (APPs -  Physician Assistants and Nurse Practitioners) who all work together to provide you with the care you need, when you need it.  We recommend signing up for the patient portal called "MyChart".  Sign up information is provided on this After Visit Summary.  MyChart is used to connect with patients for Virtual Visits (Telemedicine).  Patients are able to view lab/test results, encounter notes, upcoming appointments, etc.  Non-urgent messages can be sent to your provider as well.   To learn more about what you can do with MyChart, go to [email or web address].    Your next appointment:   6 month(s)  The format for your next appointment:   In Person  Provider:   Canary Brim, NP {    Thank you for choosing [facility]!!   Dory Horn, RN [phone]

## 2023-02-20 ENCOUNTER — Other Ambulatory Visit: Payer: Self-pay | Admitting: Nurse Practitioner

## 2023-02-20 ENCOUNTER — Other Ambulatory Visit (HOSPITAL_COMMUNITY): Payer: Self-pay

## 2023-02-21 ENCOUNTER — Other Ambulatory Visit (HOSPITAL_COMMUNITY): Payer: Self-pay

## 2023-02-21 ENCOUNTER — Other Ambulatory Visit: Payer: Self-pay

## 2023-02-21 MED ORDER — SPIRONOLACTONE 25 MG PO TABS
12.5000 mg | ORAL_TABLET | Freq: Every day | ORAL | 1 refills | Status: DC
  Filled 2023-02-21: qty 15, 30d supply, fill #0
  Filled 2023-03-22: qty 15, 30d supply, fill #1

## 2023-02-21 MED ORDER — APIXABAN 5 MG PO TABS
5.0000 mg | ORAL_TABLET | Freq: Two times a day (BID) | ORAL | 0 refills | Status: DC
  Filled 2023-02-21: qty 60, 30d supply, fill #0

## 2023-02-21 MED ORDER — FUROSEMIDE 40 MG PO TABS
40.0000 mg | ORAL_TABLET | Freq: Two times a day (BID) | ORAL | 1 refills | Status: DC
  Filled 2023-02-21: qty 60, 30d supply, fill #0
  Filled 2023-03-22: qty 60, 30d supply, fill #1

## 2023-03-06 ENCOUNTER — Ambulatory Visit: Payer: Commercial Managed Care - PPO | Admitting: Nurse Practitioner

## 2023-03-06 NOTE — Progress Notes (Deleted)
Madelaine Bhat, CMA,acting as a Neurosurgeon for [name], FNP.,have documented all relevant documentation on the behalf of [name], FNP,as directed by  [name], FNP while in the presence of [name], FNP.  Subjective:  Patient ID: Anthony Delgado , male    DOB: 06/26/1976 , 47 y.o.   MRN: [id number]  No chief complaint on file.   HPI  Patient presents today for an bp and chol follow up, patient reports compliance with  medications and has no other concerns today. Patient denies any chest pain, SOB, and headaches.     Past Medical History:  Diagnosis Date  . Hypertension      Family History  Problem Relation Age of Onset  . Cancer Mother   . Stroke Neg Hx        None at early ages  . CAD Neg Hx        None at early ages     Current Outpatient Medications:  .  apixaban (ELIQUIS) 5 MG TABS tablet, Take 1 tablet (5 mg total) by mouth 2 (two) times daily., Disp: 60 tablet, Rfl: 0 .  carvedilol (COREG) 12.5 MG tablet, Take 1 tablet (12.5 mg total) by mouth 2 (two) times daily., Disp: 180 tablet, Rfl: 0 .  dapagliflozin propanediol (FARXIGA) 10 MG TABS tablet, Take 1 tablet (10 mg total) by mouth daily., Disp: 90 tablet, Rfl: 1 .  dofetilide (TIKOSYN) 500 MCG capsule, Take 1 capsule (500 mcg total) by mouth 2 (two) times daily., Disp: 180 capsule, Rfl: 2 .  furosemide (LASIX) 40 MG tablet, Take 1 tablet (40 mg total) by mouth 2 (two) times daily., Disp: 60 tablet, Rfl: 1 .  spironolactone (ALDACTONE) 25 MG tablet, Take 1/2 tablet (12.5 mg total) by mouth daily., Disp: 15 tablet, Rfl: 1   No Known Allergies   Review of Systems  Constitutional: Negative.   HENT: Negative.    Eyes: Negative.   Respiratory: Negative.    Cardiovascular: Negative.   Gastrointestinal: Negative.     There were no vitals filed for this visit. There is no height or weight on file to calculate BMI.  Wt Readings from Last 3 Encounters:  02/07/23 (!) 344 lb 9.6 oz (156.3 kg)  10/30/22 (!) 345  lb (156.5 kg)  08/03/22 (!) 337 lb 6.4 oz (153 kg)    The 10-year ASCVD risk score (Arnett DK, et al., 2019) is: 9.3%   Values used to calculate the score:     Age: 19 years     Sex: Male     Is Non-Hispanic African American: Yes     Diabetic: No     Tobacco smoker: No     Systolic Blood Pressure: 128 mmHg     Is BP treated: Yes     HDL Cholesterol: 35 mg/dL     Total Cholesterol: 198 mg/dL  Objective:  Physical Exam      Assessment And Plan:  Malignant essential hypertension with congestive heart failure (HCC)  Elevated LDL cholesterol level    No follow-ups on file.  Patient was given opportunity to ask questions. Patient verbalized understanding of the plan and was able to repeat key elements of the plan. All questions were answered to their satisfaction.    Jeanell Sparrow, FNP, have reviewed all documentation for this visit. The documentation on 03/06/23 for the exam, diagnosis, procedures, and orders are all accurate and complete.   IF YOU HAVE BEEN REFERRED TO A SPECIALIST, IT MAY TAKE 1-2  WEEKS TO SCHEDULE/PROCESS THE REFERRAL. IF YOU HAVE NOT HEARD FROM US/SPECIALIST IN TWO WEEKS, PLEASE GIVE [place] A CALL AT [phone].

## 2023-03-22 ENCOUNTER — Other Ambulatory Visit: Payer: Self-pay

## 2023-03-22 ENCOUNTER — Other Ambulatory Visit: Payer: Self-pay | Admitting: Nurse Practitioner

## 2023-03-22 ENCOUNTER — Other Ambulatory Visit (HOSPITAL_COMMUNITY): Payer: Self-pay

## 2023-03-22 ENCOUNTER — Other Ambulatory Visit: Payer: Self-pay | Admitting: *Deleted

## 2023-03-22 DIAGNOSIS — I4819 Other persistent atrial fibrillation: Secondary | ICD-10-CM

## 2023-03-22 DIAGNOSIS — I5022 Chronic systolic (congestive) heart failure: Secondary | ICD-10-CM

## 2023-03-22 DIAGNOSIS — Z79899 Other long term (current) drug therapy: Secondary | ICD-10-CM

## 2023-03-22 MED ORDER — APIXABAN 5 MG PO TABS
5.0000 mg | ORAL_TABLET | Freq: Two times a day (BID) | ORAL | 0 refills | Status: DC
  Filled 2023-03-22: qty 60, 30d supply, fill #0

## 2023-03-22 MED ORDER — CARVEDILOL 12.5 MG PO TABS
12.5000 mg | ORAL_TABLET | Freq: Two times a day (BID) | ORAL | 0 refills | Status: DC
  Filled 2023-03-22: qty 60, 30d supply, fill #0
  Filled 2023-04-24: qty 60, 30d supply, fill #1
  Filled 2023-05-23: qty 60, 30d supply, fill #2

## 2023-03-26 ENCOUNTER — Ambulatory Visit: Payer: Commercial Managed Care - PPO | Attending: Cardiology

## 2023-03-26 DIAGNOSIS — I4819 Other persistent atrial fibrillation: Secondary | ICD-10-CM

## 2023-03-26 DIAGNOSIS — Z79899 Other long term (current) drug therapy: Secondary | ICD-10-CM

## 2023-04-02 ENCOUNTER — Other Ambulatory Visit (HOSPITAL_COMMUNITY): Payer: Self-pay

## 2023-04-02 ENCOUNTER — Other Ambulatory Visit: Payer: Self-pay | Admitting: Nurse Practitioner

## 2023-04-02 MED ORDER — DAPAGLIFLOZIN PROPANEDIOL 10 MG PO TABS
10.0000 mg | ORAL_TABLET | Freq: Every day | ORAL | 1 refills | Status: DC
  Filled 2023-04-02: qty 30, 30d supply, fill #0
  Filled 2023-04-30: qty 30, 30d supply, fill #1
  Filled 2023-05-28: qty 30, 30d supply, fill #2
  Filled 2023-06-27: qty 30, 30d supply, fill #3
  Filled 2023-07-25: qty 30, 30d supply, fill #4
  Filled 2023-08-23: qty 30, 30d supply, fill #5

## 2023-04-03 ENCOUNTER — Encounter: Payer: Self-pay | Admitting: Nurse Practitioner

## 2023-04-03 ENCOUNTER — Ambulatory Visit (INDEPENDENT_AMBULATORY_CARE_PROVIDER_SITE_OTHER): Payer: Commercial Managed Care - PPO | Admitting: Nurse Practitioner

## 2023-04-03 VITALS — BP 130/86 | HR 86 | Temp 98.1°F | Ht 70.0 in | Wt 349.0 lb

## 2023-04-03 DIAGNOSIS — Z6841 Body Mass Index (BMI) 40.0 and over, adult: Secondary | ICD-10-CM

## 2023-04-03 DIAGNOSIS — E782 Mixed hyperlipidemia: Secondary | ICD-10-CM

## 2023-04-03 DIAGNOSIS — E662 Morbid (severe) obesity with alveolar hypoventilation: Secondary | ICD-10-CM

## 2023-04-03 DIAGNOSIS — I110 Hypertensive heart disease with heart failure: Secondary | ICD-10-CM

## 2023-04-03 DIAGNOSIS — Z1211 Encounter for screening for malignant neoplasm of colon: Secondary | ICD-10-CM

## 2023-04-03 DIAGNOSIS — R7303 Prediabetes: Secondary | ICD-10-CM

## 2023-04-03 NOTE — Progress Notes (Unsigned)
Madelaine Bhat, CMA,acting as a Neurosurgeon for [name], FNP.,have documented all relevant documentation on the behalf of [name], FNP,as directed by  [name], FNP while in the presence of [name], FNP.  Subjective:  Patient ID: Anthony Delgado , male    DOB: 02-13-1976 , 47 y.o.   MRN: [id number]  Chief Complaint  Patient presents with   Hypertension    HPI  Patient presents today for an bp and chol follow up, patient reports compliance with  medications and has no other concerns today. Patient denies any chest pain, SOB, and headaches.      Past Medical History:  Diagnosis Date   Hypertension      Family History  Problem Relation Age of Onset   Cancer Mother    Stroke Neg Hx        None at early ages   CAD Neg Hx        None at early ages     Current Outpatient Medications:    apixaban (ELIQUIS) 5 MG TABS tablet, Take 1 tablet (5 mg total) by mouth 2 (two) times daily., Disp: 60 tablet, Rfl: 0   carvedilol (COREG) 12.5 MG tablet, Take 1 tablet (12.5 mg total) by mouth 2 (two) times daily., Disp: 180 tablet, Rfl: 0   dapagliflozin propanediol (FARXIGA) 10 MG TABS tablet, Take 1 tablet (10 mg total) by mouth daily., Disp: 90 tablet, Rfl: 1   dofetilide (TIKOSYN) 500 MCG capsule, Take 1 capsule (500 mcg total) by mouth 2 (two) times daily., Disp: 180 capsule, Rfl: 2   furosemide (LASIX) 40 MG tablet, Take 1 tablet (40 mg total) by mouth 2 (two) times daily., Disp: 60 tablet, Rfl: 1   spironolactone (ALDACTONE) 25 MG tablet, Take 1/2 tablet (12.5 mg total) by mouth daily., Disp: 15 tablet, Rfl: 1   No Known Allergies   Review of Systems  Constitutional: Negative.   HENT: Negative.    Eyes: Negative.   Respiratory: Negative.    Cardiovascular: Negative.   Gastrointestinal: Negative.   Psychiatric/Behavioral: Negative.       Today's Vitals   04/03/23 1540  BP: 130/86  Pulse: 86  Temp: 98.1 F (36.7 C)  Weight: (!) 349 lb (158.3 kg)  Height: 5\' 10"   (1.778 m)  PainSc: 0-No pain   Body mass index is 50.08 kg/m.  Wt Readings from Last 3 Encounters:  04/03/23 (!) 349 lb (158.3 kg)  02/07/23 (!) 344 lb 9.6 oz (156.3 kg)  10/30/22 (!) 345 lb (156.5 kg)    The 10-year ASCVD risk score (Arnett DK, et al., 2019) is: 9.5%   Values used to calculate the score:     Age: 50 years     Sex: Male     Is Non-Hispanic African American: Yes     Diabetic: No     Tobacco smoker: No     Systolic Blood Pressure: 130 mmHg     Is BP treated: Yes     HDL Cholesterol: 35 mg/dL     Total Cholesterol: 198 mg/dL  Objective:  Physical Exam Vitals reviewed.  Constitutional:      General: He is not in acute distress.    Appearance: Normal appearance. He is obese.  Cardiovascular:     Rate and Rhythm: Normal rate and regular rhythm.     Pulses: Normal pulses.     Heart sounds: Normal heart sounds. No murmur heard. Pulmonary:     Effort: No respiratory distress.  Breath sounds: Normal breath sounds. No wheezing.  Musculoskeletal:        General: No swelling.  Skin:    General: Skin is warm and dry.     Capillary Refill: Capillary refill takes less than 2 seconds.  Neurological:     General: No focal deficit present.     Mental Status: He is alert and oriented to person, place, and time.     Cranial Nerves: No cranial nerve deficit.  Psychiatric:        Mood and Affect: Mood normal.        Behavior: Behavior normal.        Thought Content: Thought content normal.        Judgment: Judgment normal.         Assessment And Plan:  Malignant essential hypertension with congestive heart failure (HCC) -     BMP8+eGFR  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, unspecified whether serious comorbidity present (HCC)  Mixed hyperlipidemia -     Lipid panel -     BMP8+eGFR  Prediabetes -     Hemoglobin A1c  Colon cancer screening -     Cologuard    Return in about 4 months (around  08/03/2023) for bp check.  Patient was given opportunity to ask questions. Patient verbalized understanding of the plan and was able to repeat key elements of the plan. All questions were answered to their satisfaction.    Jeanell Sparrow, FNP, have reviewed all documentation for this visit. The documentation on 04/03/23 for the exam, diagnosis, procedures, and orders are all accurate and complete.   IF YOU HAVE BEEN REFERRED TO A SPECIALIST, IT MAY TAKE 1-2 WEEKS TO SCHEDULE/PROCESS THE REFERRAL. IF YOU HAVE NOT HEARD FROM US/SPECIALIST IN TWO WEEKS, PLEASE GIVE [place] A CALL AT [phone].

## 2023-04-03 NOTE — Patient Instructions (Signed)
You should receive a call from [facility] about your cologuard within the next 2 weeks if you have not heard from them return a call to office or send message to [employer]

## 2023-04-04 DIAGNOSIS — I110 Hypertensive heart disease with heart failure: Secondary | ICD-10-CM | POA: Insufficient documentation

## 2023-04-04 DIAGNOSIS — E662 Morbid (severe) obesity with alveolar hypoventilation: Secondary | ICD-10-CM | POA: Insufficient documentation

## 2023-04-04 DIAGNOSIS — R7303 Prediabetes: Secondary | ICD-10-CM | POA: Insufficient documentation

## 2023-04-04 DIAGNOSIS — Z1211 Encounter for screening for malignant neoplasm of colon: Secondary | ICD-10-CM | POA: Insufficient documentation

## 2023-04-04 DIAGNOSIS — E782 Mixed hyperlipidemia: Secondary | ICD-10-CM | POA: Insufficient documentation

## 2023-04-04 NOTE — Assessment & Plan Note (Signed)
Will reorder Cologuard.  If he has not heard from them in the next week or 2 return call to office for follow-up.

## 2023-04-04 NOTE — Assessment & Plan Note (Signed)
Cholesterol levels were elevated at last visit, continue statin.  Will check lipid panel today.

## 2023-04-04 NOTE — Assessment & Plan Note (Signed)
Hemoglobin A1c is stable.  Will recheck today

## 2023-04-04 NOTE — Assessment & Plan Note (Signed)
Blood pressure is fairly controlled.  Continue current medications and follow-up with cardiologist.

## 2023-04-04 NOTE — Assessment & Plan Note (Signed)
he is encouraged to strive for BMI less than 30 to decrease cardiac risk. Advised to aim for at least 150 minutes of exercise per week.  Continue getting in physical activity

## 2023-04-24 ENCOUNTER — Other Ambulatory Visit: Payer: Self-pay | Admitting: Nurse Practitioner

## 2023-04-25 ENCOUNTER — Other Ambulatory Visit (HOSPITAL_COMMUNITY): Payer: Self-pay

## 2023-04-25 ENCOUNTER — Other Ambulatory Visit: Payer: Self-pay

## 2023-04-25 MED ORDER — FUROSEMIDE 40 MG PO TABS
40.0000 mg | ORAL_TABLET | Freq: Two times a day (BID) | ORAL | 1 refills | Status: DC
  Filled 2023-04-25: qty 60, 30d supply, fill #0
  Filled 2023-05-23: qty 60, 30d supply, fill #1

## 2023-04-25 MED ORDER — APIXABAN 5 MG PO TABS
5.0000 mg | ORAL_TABLET | Freq: Two times a day (BID) | ORAL | 0 refills | Status: DC
  Filled 2023-04-25: qty 60, 30d supply, fill #0

## 2023-04-25 MED ORDER — SPIRONOLACTONE 25 MG PO TABS
12.5000 mg | ORAL_TABLET | Freq: Every day | ORAL | 1 refills | Status: DC
  Filled 2023-04-25: qty 15, 30d supply, fill #0
  Filled 2023-05-23: qty 15, 30d supply, fill #1

## 2023-05-23 ENCOUNTER — Other Ambulatory Visit: Payer: Self-pay

## 2023-05-23 ENCOUNTER — Other Ambulatory Visit: Payer: Self-pay | Admitting: Nurse Practitioner

## 2023-05-23 ENCOUNTER — Other Ambulatory Visit (HOSPITAL_COMMUNITY): Payer: Self-pay

## 2023-05-24 MED ORDER — APIXABAN 5 MG PO TABS
5.0000 mg | ORAL_TABLET | Freq: Two times a day (BID) | ORAL | 0 refills | Status: DC
  Filled 2023-05-24: qty 60, 30d supply, fill #0

## 2023-05-25 ENCOUNTER — Other Ambulatory Visit (HOSPITAL_COMMUNITY): Payer: Self-pay

## 2023-05-29 ENCOUNTER — Other Ambulatory Visit (HOSPITAL_COMMUNITY): Payer: Self-pay

## 2023-06-20 ENCOUNTER — Telehealth: Payer: Self-pay

## 2023-06-20 ENCOUNTER — Telehealth: Payer: Self-pay | Admitting: Cardiology

## 2023-06-20 ENCOUNTER — Other Ambulatory Visit: Payer: Self-pay

## 2023-06-20 ENCOUNTER — Other Ambulatory Visit: Payer: Self-pay | Admitting: Nurse Practitioner

## 2023-06-20 ENCOUNTER — Other Ambulatory Visit (HOSPITAL_COMMUNITY): Payer: Self-pay

## 2023-06-20 ENCOUNTER — Encounter: Payer: Self-pay | Admitting: Nurse Practitioner

## 2023-06-20 ENCOUNTER — Emergency Department (HOSPITAL_COMMUNITY)
Admission: EM | Admit: 2023-06-20 | Discharge: 2023-06-20 | Disposition: A | Discharge status: Home / Self Care | Payer: Commercial Managed Care - PPO | Attending: Emergency Medicine | Admitting: Emergency Medicine

## 2023-06-20 ENCOUNTER — Encounter (HOSPITAL_COMMUNITY): Payer: Self-pay

## 2023-06-20 DIAGNOSIS — I509 Heart failure, unspecified: Secondary | ICD-10-CM | POA: Insufficient documentation

## 2023-06-20 DIAGNOSIS — K625 Hemorrhage of anus and rectum: Secondary | ICD-10-CM | POA: Insufficient documentation

## 2023-06-20 DIAGNOSIS — Z7901 Long term (current) use of anticoagulants: Secondary | ICD-10-CM | POA: Diagnosis not present

## 2023-06-20 HISTORY — DX: Unspecified atrial fibrillation: I48.91

## 2023-06-20 LAB — CBC
HCT: 49.5 % (ref 39.0–52.0)
Hemoglobin: 16.4 g/dL (ref 13.0–17.0)
MCH: 31 pg (ref 26.0–34.0)
MCHC: 33.1 g/dL (ref 30.0–36.0)
MCV: 93.6 fL (ref 80.0–100.0)
Platelets: 272 10*3/uL (ref 150–400)
RBC: 5.29 MIL/uL (ref 4.22–5.81)
RDW: 13.9 % (ref 11.5–15.5)
WBC: 5.3 10*3/uL (ref 4.0–10.5)
nRBC: 0 % (ref 0.0–0.2)

## 2023-06-20 LAB — COMPREHENSIVE METABOLIC PANEL
ALT: 32 U/L (ref 0–44)
AST: 40 U/L (ref 15–41)
Albumin: 3.5 g/dL (ref 3.5–5.0)
Alkaline Phosphatase: 57 U/L (ref 38–126)
Anion gap: 12 (ref 5–15)
BUN: 10 mg/dL (ref 6–20)
CO2: 25 mmol/L (ref 22–32)
Calcium: 8.9 mg/dL (ref 8.9–10.3)
Chloride: 101 mmol/L (ref 98–111)
Creatinine, Ser: 1.12 mg/dL (ref 0.61–1.24)
GFR, Estimated: >60 mL/min (ref >60)
Glucose, Bld: 93 mg/dL (ref 70–99)
Potassium: 4.1 mmol/L (ref 3.5–5.1)
Sodium: 138 mmol/L (ref 135–145)
Total Bilirubin: 0.7 mg/dL (ref 0.3–1.2)
Total Protein: 8 g/dL (ref 6.5–8.1)

## 2023-06-20 LAB — TYPE AND SCREEN
ABO/RH(D): B POS
Antibody Screen: NEGATIVE

## 2023-06-20 LAB — POC OCCULT BLOOD, ED: Fecal Occult Bld: NEGATIVE

## 2023-06-20 NOTE — ED Provider Notes (Signed)
[place] EMERGENCY DEPARTMENT AT [facility] Provider Note   CSN: [id number] Arrival date & time: 06/20/23  1231     History  No chief complaint on file.   Anthony Delgado is a 47 y.o. male.  The history is provided by the patient and medical records. No language interpreter was used.  Rectal Bleeding Quality:  Maroon Amount:  Unable to specify Duration:  1 day Timing:  Sporadic Chronicity:  New Context: not hemorrhoids and not rectal injury   Similar prior episodes: no   Relieved by:  Nothing Worsened by:  Nothing Ineffective treatments:  None tried Associated symptoms: light-headedness (briefly)   Associated symptoms: no abdominal pain, no dizziness, no fever, no loss of consciousness and no vomiting   Risk factors: anticoagulant use        Home Medications Prior to Admission medications   Medication Sig Start Date End Date Taking? Authorizing Provider  apixaban (ELIQUIS) 5 MG TABS tablet Take 1 tablet (5 mg total) by mouth 2 (two) times daily. 05/24/23   [name], FNP  carvedilol (COREG) 12.5 MG tablet Take 1 tablet (12.5 mg total) by mouth 2 (two) times daily. 03/22/23   [name], FNP  dapagliflozin propanediol (FARXIGA) 10 MG TABS tablet Take 1 tablet (10 mg total) by mouth daily. 04/02/23   [name], FNP  dofetilide (TIKOSYN) 500 MCG capsule Take 1 capsule (500 mcg total) by mouth 2 (two) times daily. 11/22/22   Camnitz, Andree Coss, MD  furosemide (LASIX) 40 MG tablet Take 1 tablet (40 mg total) by mouth 2 (two) times daily. 04/25/23   [name], FNP  spironolactone (ALDACTONE) 25 MG tablet Take 1/2 tablet (12.5 mg total) by mouth daily. 04/25/23   [name], FNP      Allergies    Patient has no known allergies.    Review of Systems   Review of Systems  Constitutional:  Negative for chills, diaphoresis, fatigue and fever.  HENT:  Negative for congestion.   Respiratory:  Negative for cough, chest tightness, shortness of breath and  wheezing.   Cardiovascular:  Negative for chest pain, palpitations and leg swelling.  Gastrointestinal:  Positive for blood in stool (per pt) and hematochezia. Negative for abdominal distention, abdominal pain, constipation, diarrhea, nausea, rectal pain and vomiting.  Genitourinary:  Negative for dysuria and flank pain.  Musculoskeletal:  Negative for back pain, neck pain and neck stiffness.  Skin:  Negative for rash and wound.  Neurological:  Positive for light-headedness (briefly). Negative for dizziness, loss of consciousness, speech difficulty, weakness, numbness and headaches.  Psychiatric/Behavioral:  Negative for agitation and confusion.   All other systems reviewed and are negative.   Physical Exam Updated Vital Signs BP 126/87 (BP Location: Right Arm)   Pulse 64   Temp 97.9 F (36.6 C) (Oral)   Resp 17   SpO2 100%  Physical Exam Vitals and nursing note reviewed. Exam conducted with a chaperone present.  Constitutional:      General: He is not in acute distress.    Appearance: He is well-developed. He is not ill-appearing, toxic-appearing or diaphoretic.  HENT:     Head: Normocephalic and atraumatic.     Right Ear: External ear normal.     Left Ear: External ear normal.     Nose: Nose normal. No congestion or rhinorrhea.     Mouth/Throat:     Mouth: Mucous membranes are moist.     Pharynx: No oropharyngeal exudate.  Eyes:  Conjunctiva/sclera: Conjunctivae normal.     Pupils: Pupils are equal, round, and reactive to light.  Cardiovascular:     Heart sounds: No murmur heard. Pulmonary:     Effort: No respiratory distress.     Breath sounds: No stridor. No wheezing, rhonchi or rales.  Chest:     Chest wall: No tenderness.  Abdominal:     General: Abdomen is flat.     Palpations: Abdomen is soft.     Tenderness: There is no abdominal tenderness. There is no guarding or rebound.  Genitourinary:    Rectum: Guaiac result negative.  Musculoskeletal:         General: No tenderness.     Cervical back: Normal range of motion and neck supple.     Right lower leg: No edema.     Left lower leg: No edema.  Skin:    General: Skin is warm.     Coloration: Skin is not pale.     Findings: No erythema or rash.  Neurological:     Mental Status: He is alert and oriented to person, place, and time.     Cranial Nerves: No cranial nerve deficit.     Motor: No abnormal muscle tone.     ED Results / Procedures / Treatments   Labs (all labs ordered are listed, but only abnormal results are displayed) Labs Reviewed  COMPREHENSIVE METABOLIC PANEL  CBC  POC OCCULT BLOOD, ED  TYPE AND SCREEN    EKG None  Radiology No results found.  Procedures Procedures    Medications Ordered in ED Medications - No data to display  ED Course/ Medical Decision Making/ A&P                                 Medical Decision Making Amount and/or Complexity of Data Reviewed Labs: ordered.   Anthony Delgado is a 47 y.o. male with past medical history significant for previous TIA, A-fib on Eliquis therapy, and heart failure who presents with suspected rectal bleeding.  According to patient, yesterday afternoon he had 1 stool that looked maroon and looked bloody although he did not have any blood with wiping.  No abdominal pain rectal pain or back pain.  No fevers or chills.  He had lightheadedness briefly shortly after the bowel movement but has not had any other symptoms.  No chest pain, palpitation, shortness of breath.  No nausea or vomiting.  No other change in stools today and he has had 2 normal bowel movements without maroon darkness or evidence of bleeding.  No other complaints.  Patient was told to come in by his PCP due to the possible rectal bleeding on blood thinners.  On exam, lungs clear.  Chest nontender.  Adequately nontender.  Normal bowel sounds.  Rectal exam was performed and fecal occult was negative.  No hemorrhoids seen.  No tenderness.  Patient  otherwise well-appearing.  Patient had workup that showed no evidence of anemia.  Fecal occult was negative.     CBC and CMP otherwise reassuring.  We had a shared decision-making conversation.  His vital signs remained reassuring without hypotension or significant tachycardia and there did not appear to be active bleeding.  Given his lack of any symptoms and lack of abdominal pain, we agreed to hold on further imaging and agreed to hold on further management here.  He may have had a mild diverticular bleed yesterday versus stool color change  not related to bleeding given the negative fecal occult but we do feel patient is safer discharge home to follow-up with his team.  He is can see his PCP next week and may need GI follow-up.  He agrees.  He had no other questions or concerns and patient was discharged in good condition.         Final Clinical Impression(s) / ED Diagnoses Final diagnoses:  Painless rectal bleeding    Rx / DC Orders ED Discharge Orders     None       Clinical Impression: 1. Painless rectal bleeding     Disposition: Discharge  Condition: Good  I have discussed the results, Dx and Tx plan with the pt(& family if present). He/she/they expressed understanding and agree(s) with the plan. Discharge instructions discussed at great length. Strict return precautions discussed and pt &/or family have verbalized understanding of the instructions. No further questions at time of discharge.    Discharge Medication List as of 06/20/2023  7:04 PM      Follow Up: [name], FNP [address] [place] [address] [phone]     [facility] Emergency Department at [facility] [address] [place] [address] [phone]        Destry Bezdek, Canary Brim, MD 06/20/23 [phone]

## 2023-06-20 NOTE — ED Notes (Signed)
Patient verbalizes understanding of discharge instructions. Opportunity for questioning and answers were provided. Armband removed by staff, pt discharged from ED. Ambulated out to lobby with son  

## 2023-06-20 NOTE — Telephone Encounter (Signed)
Left message to call back  

## 2023-06-20 NOTE — ED Triage Notes (Signed)
Pt called pcp this am, states he had bloody bm yesteray , dark red; sent to ed for further elvauation; denies hx of same; pt on eleqius for afib; ensorses some dizziness yeserday, denie sob; not dizzy currently; denies pain

## 2023-06-20 NOTE — Telephone Encounter (Signed)
  Per MyChart scheduling message:   Pt c/o swelling/edema: STAT if pt has developed SOB within 24 hours  If swelling, where is the swelling located?   How much weight have you gained and in what time span?   Have you gained 2 pounds in a day or 5 pounds in a week?   Do you have a log of your daily weights (if so, list)?   Are you currently taking a fluid pill?   Are you currently SOB?   Have you traveled recently in a car or plane for an extended period of time?    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

## 2023-06-20 NOTE — Discharge Instructions (Signed)
Your history, exam, and evaluation today did not show evidence of active rectal hemorrhage at this time.  Your blood test on the stool was negative but you may still have had some bleeding yesterday as you discussed.  Your hemoglobin was normal and you are not anemic and given lack of other symptoms we feel you are safe for discharge home to follow-up with your primary doctor in several days and observe strict return precautions.  If any symptoms change or worsen acutely, please return to the nearest emergency department.

## 2023-06-20 NOTE — Telephone Encounter (Signed)
Patient sent message stating he noticed blood in stool. Patient was called and he reported he it happened yesterday and he was lightheaded and dizzy. Patient was advised to go ahead and go to the hospital.

## 2023-06-21 ENCOUNTER — Other Ambulatory Visit (HOSPITAL_COMMUNITY): Payer: Self-pay

## 2023-06-21 MED ORDER — SPIRONOLACTONE 25 MG PO TABS
12.5000 mg | ORAL_TABLET | Freq: Every day | ORAL | 1 refills | Status: DC
  Filled 2023-06-21: qty 15, 30d supply, fill #0
  Filled 2023-07-19: qty 15, 30d supply, fill #1

## 2023-06-21 MED ORDER — CARVEDILOL 12.5 MG PO TABS
12.5000 mg | ORAL_TABLET | Freq: Two times a day (BID) | ORAL | 0 refills | Status: DC
  Filled 2023-06-21: qty 60, 30d supply, fill #0
  Filled 2023-07-19: qty 60, 30d supply, fill #1
  Filled 2023-08-23: qty 60, 30d supply, fill #2

## 2023-06-21 MED ORDER — APIXABAN 5 MG PO TABS
5.0000 mg | ORAL_TABLET | Freq: Two times a day (BID) | ORAL | 0 refills | Status: DC
  Filled 2023-06-21: qty 60, 30d supply, fill #0

## 2023-06-21 MED ORDER — FUROSEMIDE 40 MG PO TABS
40.0000 mg | ORAL_TABLET | Freq: Two times a day (BID) | ORAL | 1 refills | Status: DC
  Filled 2023-06-21: qty 60, 30d supply, fill #0
  Filled 2023-07-19: qty 60, 30d supply, fill #1

## 2023-06-23 LAB — COLOGUARD: COLOGUARD: NEGATIVE

## 2023-06-25 ENCOUNTER — Encounter (HOSPITAL_COMMUNITY): Payer: Self-pay | Admitting: Emergency Medicine

## 2023-06-25 ENCOUNTER — Other Ambulatory Visit (HOSPITAL_COMMUNITY): Payer: Self-pay

## 2023-06-25 ENCOUNTER — Emergency Department (HOSPITAL_COMMUNITY)
Admission: EM | Admit: 2023-06-25 | Discharge: 2023-06-25 | Disposition: A | Discharge status: Home / Self Care | Payer: Commercial Managed Care - PPO | Attending: Emergency Medicine | Admitting: Emergency Medicine

## 2023-06-25 DIAGNOSIS — K625 Hemorrhage of anus and rectum: Secondary | ICD-10-CM | POA: Diagnosis present

## 2023-06-25 DIAGNOSIS — Z7901 Long term (current) use of anticoagulants: Secondary | ICD-10-CM | POA: Insufficient documentation

## 2023-06-25 LAB — CBC WITH DIFFERENTIAL/PLATELET
Abs Immature Granulocytes: 0.01 10*3/uL (ref 0.00–0.07)
Basophils Absolute: 0 10*3/uL (ref 0.0–0.1)
Basophils Relative: 1 %
Eosinophils Absolute: 0.2 10*3/uL (ref 0.0–0.5)
Eosinophils Relative: 5 %
HCT: 46.7 % (ref 39.0–52.0)
Hemoglobin: 15.8 g/dL (ref 13.0–17.0)
Immature Granulocytes: 0 %
Lymphocytes Relative: 46 %
Lymphs Abs: 2.3 10*3/uL (ref 0.7–4.0)
MCH: 31.5 pg (ref 26.0–34.0)
MCHC: 33.8 g/dL (ref 30.0–36.0)
MCV: 93.2 fL (ref 80.0–100.0)
Monocytes Absolute: 0.4 10*3/uL (ref 0.1–1.0)
Monocytes Relative: 8 %
Neutro Abs: 2 10*3/uL (ref 1.7–7.7)
Neutrophils Relative %: 40 %
Platelets: 246 10*3/uL (ref 150–400)
RBC: 5.01 MIL/uL (ref 4.22–5.81)
RDW: 13.7 % (ref 11.5–15.5)
WBC: 5 10*3/uL (ref 4.0–10.5)
nRBC: 0 % (ref 0.0–0.2)

## 2023-06-25 LAB — COMPREHENSIVE METABOLIC PANEL
ALT: 29 U/L (ref 0–44)
AST: 33 U/L (ref 15–41)
Albumin: 3.3 g/dL — ABNORMAL LOW (ref 3.5–5.0)
Alkaline Phosphatase: 56 U/L (ref 38–126)
Anion gap: 7 (ref 5–15)
BUN: 12 mg/dL (ref 6–20)
CO2: 24 mmol/L (ref 22–32)
Calcium: 8.4 mg/dL — ABNORMAL LOW (ref 8.9–10.3)
Chloride: 104 mmol/L (ref 98–111)
Creatinine, Ser: 1.14 mg/dL (ref 0.61–1.24)
GFR, Estimated: >60 mL/min (ref >60)
Glucose, Bld: 119 mg/dL — ABNORMAL HIGH (ref 70–99)
Potassium: 3.7 mmol/L (ref 3.5–5.1)
Sodium: 135 mmol/L (ref 135–145)
Total Bilirubin: 0.5 mg/dL (ref 0.3–1.2)
Total Protein: 7.5 g/dL (ref 6.5–8.1)

## 2023-06-25 LAB — TYPE AND SCREEN
ABO/RH(D): B POS
Antibody Screen: NEGATIVE

## 2023-06-25 MED ORDER — HYDROCORTISONE ACETATE 25 MG RE SUPP
25.0000 mg | Freq: Two times a day (BID) | RECTAL | 0 refills | Status: DC
  Filled 2023-06-25: qty 14, 7d supply, fill #0

## 2023-06-25 NOTE — ED Triage Notes (Signed)
Pt back to the ED for continued blood in his stool , 2 more BM this morning both with blood in them , no abd pain

## 2023-06-25 NOTE — Discharge Instructions (Addendum)
Continue your Eliquis.  Take MiraLAX if needed for constipation or harder stools.  The [facility] gastroenterology team will contact you for an appointment.  If you develop new or worsening bleeding, abdominal pain or rectal pain, trouble breathing, chest pain, dizziness or lightheadedness, or any other new/concerning symptoms then return to the ER or call 911.

## 2023-06-25 NOTE — ED Provider Notes (Signed)
EMERGENCY DEPARTMENT AT [facility] Provider Note   CSN: [id number] Arrival date & time: 06/25/23  0865     History  Chief Complaint  Patient presents with   GI Bleeding    Anthony Delgado is a 47 y.o. male.  HPI 47 year old male presents with rectal bleeding.  This originally occurred on 10/23 and he called his PCP and was sent to the ER.  After that, he has not had any further blood up until last night.  He has been having brown stool and no blood with wiping.  He has a little bit of burning at his anus since these have recurred.  He has not noticed any pain/swelling otherwise.  He feels a little lightheaded after each episode happens but right now feels okay.  No shortness of breath.  He is on Eliquis for A-fib.  He had some small amounts of bleeding per rectum last night but then this morning around 7 AM had a larger episode of dark red blood that filled the toilet.  There was no stool.  No abdominal pain. No previous colonoscopy.   Home Medications Prior to Admission medications   Medication Sig Start Date End Date Taking? Authorizing Provider  hydrocortisone (ANUSOL-HC) 25 MG suppository Place 1 suppository (25 mg total) rectally 2 (two) times daily. For 7 days 06/25/23  Yes Pricilla Loveless, MD  apixaban (ELIQUIS) 5 MG TABS tablet Take 1 tablet (5 mg total) by mouth 2 (two) times daily. 06/21/23   [name], FNP  carvedilol (COREG) 12.5 MG tablet Take 1 tablet (12.5 mg total) by mouth 2 (two) times daily. 06/21/23   [name], FNP  dapagliflozin propanediol (FARXIGA) 10 MG TABS tablet Take 1 tablet (10 mg total) by mouth daily. 04/02/23   [name], FNP  dofetilide (TIKOSYN) 500 MCG capsule Take 1 capsule (500 mcg total) by mouth 2 (two) times daily. 11/22/22   Camnitz, Andree Coss, MD  furosemide (LASIX) 40 MG tablet Take 1 tablet (40 mg total) by mouth 2 (two) times daily. 06/21/23   [name], FNP  spironolactone (ALDACTONE) 25 MG tablet Take  1/2 tablet (12.5 mg total) by mouth daily. 06/21/23   [name], FNP      Allergies    Patient has no known allergies.    Review of Systems   Review of Systems  Respiratory:  Negative for shortness of breath.   Gastrointestinal:  Positive for blood in stool. Negative for abdominal pain.    Physical Exam Updated Vital Signs BP (!) 154/101   Pulse 87   Temp 98 F (36.7 C)   Resp 20   SpO2 97%  Physical Exam Vitals and nursing note reviewed. Exam conducted with a chaperone present.  Constitutional:      Appearance: He is well-developed. He is obese.  HENT:     Head: Normocephalic and atraumatic.  Cardiovascular:     Rate and Rhythm: Normal rate and regular rhythm.     Heart sounds: Normal heart sounds.  Pulmonary:     Effort: Pulmonary effort is normal.     Breath sounds: Normal breath sounds.  Abdominal:     Palpations: Abdomen is soft.     Tenderness: There is no abdominal tenderness.  Genitourinary:    Rectum: No mass, tenderness, anal fissure, external hemorrhoid or internal hemorrhoid. Normal anal tone.     Comments: Small amount of dark red blood on digital exam Skin:    General: Skin is warm and dry.  Neurological:     Mental Status: He is alert.     ED Results / Procedures / Treatments   Labs (all labs ordered are listed, but only abnormal results are displayed) Labs Reviewed  COMPREHENSIVE METABOLIC PANEL - Abnormal; Notable for the following components:      Result Value   Glucose, Bld 119 (*)    Calcium 8.4 (*)    Albumin 3.3 (*)    All other components within normal limits  CBC WITH DIFFERENTIAL/PLATELET  TYPE AND SCREEN    EKG None  Radiology No results found.  Procedures Procedures    Medications Ordered in ED Medications - No data to display  ED Course/ Medical Decision Making/ A&P                                 Medical Decision Making Amount and/or Complexity of Data Reviewed External Data Reviewed: notes. Labs:  ordered.    Details: Hemoglobin 15.8, mildly dropped from last week.  Risk Prescription drug management.   Patient is well-appearing.  No repeat bleeding since his episode 4 hours ago (upon discharge).  Hemoglobin is normal at 15.8.  No indication for admission/emergent colonoscopy.  Did discuss with GI, Dr. Marina Goodell.  He does eventually need a colonoscopy given age and symptoms.  CT could be helpful if he comes back again but would be not needed at this point given the plan for an outpatient colonoscopy (to rule out mass).  Can try Anusol given some rectal discomfort even though no obvious hemorrhoid found.  Use MiraLAX if constipated (he states he is not in his regular).  [facility] will try to get him in within the week or less.  Otherwise, discussed need for return if symptoms recur/continue and return precautions.        Final Clinical Impression(s) / ED Diagnoses Final diagnoses:  Rectal bleeding    Rx / DC Orders ED Discharge Orders          Ordered    hydrocortisone (ANUSOL-HC) 25 MG suppository  2 times daily        06/25/23 1116              Pricilla Loveless, MD 06/25/23 1119

## 2023-06-26 ENCOUNTER — Encounter (HOSPITAL_COMMUNITY): Payer: Self-pay

## 2023-07-02 ENCOUNTER — Ambulatory Visit (INDEPENDENT_AMBULATORY_CARE_PROVIDER_SITE_OTHER): Payer: Commercial Managed Care - PPO | Admitting: Physician Assistant

## 2023-07-02 ENCOUNTER — Telehealth: Payer: Self-pay

## 2023-07-02 ENCOUNTER — Encounter: Payer: Self-pay | Admitting: Physician Assistant

## 2023-07-02 ENCOUNTER — Other Ambulatory Visit (HOSPITAL_COMMUNITY): Payer: Self-pay

## 2023-07-02 ENCOUNTER — Other Ambulatory Visit (INDEPENDENT_AMBULATORY_CARE_PROVIDER_SITE_OTHER): Payer: Commercial Managed Care - PPO

## 2023-07-02 VITALS — BP 148/88 | HR 84 | Ht 70.0 in | Wt 361.1 lb

## 2023-07-02 DIAGNOSIS — K625 Hemorrhage of anus and rectum: Secondary | ICD-10-CM

## 2023-07-02 LAB — CBC WITH DIFFERENTIAL/PLATELET
Basophils Absolute: 0.1 10*3/uL (ref 0.0–0.1)
Basophils Relative: 1.5 % (ref 0.0–3.0)
Eosinophils Absolute: 0.3 10*3/uL (ref 0.0–0.7)
Eosinophils Relative: 4.7 % (ref 0.0–5.0)
HCT: 46.8 % (ref 39.0–52.0)
Hemoglobin: 15.2 g/dL (ref 13.0–17.0)
Lymphocytes Relative: 51 % — ABNORMAL HIGH (ref 12.0–46.0)
Lymphs Abs: 2.8 10*3/uL (ref 0.7–4.0)
MCHC: 32.5 g/dL (ref 30.0–36.0)
MCV: 95.1 fL (ref 78.0–100.0)
Monocytes Absolute: 0.5 10*3/uL (ref 0.1–1.0)
Monocytes Relative: 9 % (ref 3.0–12.0)
Neutro Abs: 1.8 10*3/uL (ref 1.4–7.7)
Neutrophils Relative %: 33.8 % — ABNORMAL LOW (ref 43.0–77.0)
Platelets: 248 10*3/uL (ref 150.0–400.0)
RBC: 4.93 Mil/uL (ref 4.22–5.81)
RDW: 14.5 % (ref 11.5–15.5)
WBC: 5.4 10*3/uL (ref 4.0–10.5)

## 2023-07-02 MED ORDER — NA SULFATE-K SULFATE-MG SULF 17.5-3.13-1.6 GM/177ML PO SOLN
1.0000 | Freq: Once | ORAL | 0 refills | Status: AC
Start: 2023-07-02 — End: 2023-08-03
  Filled 2023-07-02 – 2023-08-01 (×2): qty 354, 1d supply, fill #0

## 2023-07-02 NOTE — Telephone Encounter (Signed)
[facility] Pre-operative Risk Assessment     Request for surgical clearance:     Endoscopy Procedure  What type of surgery is being performed?     EGD & Colonoscopy  When is this surgery scheduled?     08/09/2023  What type of clearance is required ?   Pharmacy  Are there any medications that need to be held prior to surgery and how long? Eliquis, 2 days  Practice name and name of physician performing surgery?       Gastroenterology  What is your office phone and fax number?      Phone- [phone]  Fax- [phone]  Anesthesia type (None, local, MAC, general) ?       MAC

## 2023-07-02 NOTE — Telephone Encounter (Signed)
Please advise holding Eliquis prior to colonoscopy/EGD on 12/12.  Thank you!  DW

## 2023-07-02 NOTE — Patient Instructions (Signed)
You have been scheduled for an endoscopy and colonoscopy. Please follow the written instructions given to you at your visit today.  Please pick up your prep supplies at the pharmacy within the next 1-3 days.  Your provider has requested that you go to the basement level for lab work before leaving today. Press "B" on the elevator. The lab is located at the first door on the left as you exit the elevator.  Due to recent changes in healthcare laws, you may see the results of your imaging and laboratory studies on MyChart before your provider has had a chance to review them.  We understand that in some cases there may be results that are confusing or concerning to you. Not all laboratory results come back in the same time frame and the provider may be waiting for multiple results in order to interpret others.  Please give [place] 48 hours in order for your provider to thoroughly review all the results before contacting the office for clarification of your results.    If you use inhalers (even only as needed), please bring them with you on the day of your procedure.  DO NOT TAKE 7 DAYS PRIOR TO TEST- Trulicity (dulaglutide) Ozempic, Wegovy (semaglutide) Mounjaro (tirzepatide) Bydureon Bcise (exanatide extended release)  DO NOT TAKE 1 DAY PRIOR TO YOUR TEST Rybelsus (semaglutide) Adlyxin (lixisenatide) Victoza (liraglutide) Byetta (exanatide) ___________________________________________________________________________  Bonita Quin will be contaced by our office prior to your procedure for directions on holding your Eliquis.  If you do not hear from our office 1 week prior to your scheduled procedure, please call [phone] to discuss.  I appreciate the opportunity to care for you. Amy Esterwood PA-C

## 2023-07-02 NOTE — Progress Notes (Signed)
I agree with the assessment and plan as outlined by Ms. Esterwood. ?

## 2023-07-02 NOTE — Progress Notes (Signed)
Subjective:    Patient ID: Anthony Delgado, male    DOB: 05-16-1976, 47 y.o.   MRN: [id number]  HPI Violet is a pleasant 47 year old male, new to GI today referred after recent ER visit with complaints of rectal bleeding.  He has history of atrial fibrillation for which she is on Eliquis, and congestive heart failure with reduced EF.  Last echo in 2023 EF 35 to 40% and left ventricular diastolic function could not be assessed, severe dilation of the right atrium.  Also with morbid obesity/BMI of 51  With ER visit on 06/20/2023 stool was documented heme-negative and hemoglobin 16.4/hematocrit 49.5 CMET unremarkable, BUN 10/creatinine 1.12 LFTs within normal limits  Return to the ER on 1028 with similar complaints and at that time hemoglobin 15.8/hematocrit 46.7/BUN 12/creatinine 1.14.  He did not have any imaging done.  He was referred to GI  He has had any prior GI evaluation, he says his current symptoms started about 2 weeks ago when he had a bowel movement and then noticed that there seem to be a large of black and dark red stool in the commode.  He went to the emergency room for that initial visit and was released.  Then last Sunday about 8 days ago he had another episode which was a smaller amount, the following day he had another bowel movement all with dark and red blood mixed in.  Had no associated abdominal pain or cramping, no rectal pain or discomfort, no nausea or vomiting.  Appetite has been fine. He is not taking any regular aspirin or NSAIDs he is on the Eliquis. He says he has had some normal-appearing bowel movements in between the episodes of dark blood in the stool. Family history is negative for GI disease as far as he is aware.  Review of Systems Pertinent positive and negative review of systems were noted in the above HPI section.  All other review of systems was otherwise negative.   Outpatient Encounter Medications as of 07/02/2023  Medication Sig   apixaban (ELIQUIS) 5 MG  TABS tablet Take 1 tablet (5 mg total) by mouth 2 (two) times daily.   carvedilol (COREG) 12.5 MG tablet Take 1 tablet (12.5 mg total) by mouth 2 (two) times daily.   dapagliflozin propanediol (FARXIGA) 10 MG TABS tablet Take 1 tablet (10 mg total) by mouth daily.   dofetilide (TIKOSYN) 500 MCG capsule Take 1 capsule (500 mcg total) by mouth 2 (two) times daily.   furosemide (LASIX) 40 MG tablet Take 1 tablet (40 mg total) by mouth 2 (two) times daily.   hydrocortisone (ANUSOL-HC) 25 MG suppository Place 1 suppository (25 mg total) rectally 2 (two) times daily for 7 days.   Na Sulfate-K Sulfate-Mg Sulf 17.5-3.13-1.6 GM/177ML SOLN Take 1 kit by mouth once for 1 dose.   spironolactone (ALDACTONE) 25 MG tablet Take 1/2 tablet (12.5 mg total) by mouth daily.   No facility-administered encounter medications on file as of 07/02/2023.   No Known Allergies Patient Active Problem List   Diagnosis Date Noted   Malignant essential hypertension with congestive heart failure (HCC) 04/04/2023   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 [facility]) 04/04/2023   Mixed hyperlipidemia 04/04/2023   Prediabetes 04/04/2023   Colon cancer screening 04/04/2023   HFrEF (heart failure with reduced ejection fraction) (HCC) 10/30/2022   Elevated LDL cholesterol level 10/30/2022   Afib (HCC) 06/20/2022   Atrial fibrillation with RVR (HCC) 05/29/2022  Sensory disturbance 04/03/2013   Renal insufficiency 04/03/2013   Other and unspecified hyperlipidemia 04/03/2013   Chest pain at rest 04/03/2013   TIA (transient ischemic attack) 04/02/2013   Cellulitis and abscess of trunk 07/06/2012   Staph aureus infection 07/06/2012   Abnormal LFTs 07/06/2012   Hyponatremia 07/06/2012   Encephalopathy acute 07/06/2012   Nausea & vomiting 07/06/2012   Diarrhea 07/06/2012   Social History   Socioeconomic History   Marital status: Married    Spouse name: Harriett Sine   Number of  children: 4   Years of education: Not on file   Highest education level: High school graduate  Occupational History   Occupation: Ecologist  Tobacco Use   Smoking status: Never   Smokeless tobacco: Never  Vaping Use   Vaping status: Never Used  Substance and Sexual Activity   Alcohol use: No   Drug use: No   Sexual activity: Yes    Partners: Female  Other Topics Concern   Not on file  Social History Narrative   Not on file   Social Determinants of Health   Financial Resource Strain: Low Risk  (06/01/2022)   Overall Financial Resource Strain (CARDIA)    Difficulty of Paying Living Expenses: Not hard at all  Food Insecurity: No Food Insecurity (06/20/2022)   Hunger Vital Sign    Worried About Running Out of Food in the Last Year: Never true    Ran Out of Food in the Last Year: Never true  Transportation Needs: No Transportation Needs (06/20/2022)   PRAPARE - Administrator, Civil Service (Medical): No    Lack of Transportation (Non-Medical): No  Physical Activity: Not on file  Stress: Not on file  Social Connections: Not on file  Intimate Partner Violence: Not At Risk (06/20/2022)   Humiliation, Afraid, Rape, and Kick questionnaire    Fear of Current or Ex-Partner: No    Emotionally Abused: No    Physically Abused: No    Sexually Abused: No    Mr. Burkett family history includes Hypertension in his maternal uncle, mother, and sister; Kidney failure in his father; Prostate cancer in his maternal uncle.      Objective:    Vitals:   07/02/23 0907  BP: (!) 148/88  Pulse: 84    Physical Exam Well-developed obese AA male in no acute distress. Pleasant  Height, XBMWUX,324 BMI 51.8  HEENT; nontraumatic normocephalic, EOMI, PE R LA, sclera anicteric. Oropharynx;not done Neck; supple, no JVD Cardiovascular; regular rate and rhythm with S1-S2, no murmur rub or gallop Pulmonary; Clear bilaterally Abdomen; soft, morbidly obese, nontender, nondistended, no  palpable mass or hepatosplenomegaly, bowel sounds are active Rectal; not done today Skin; benign exam, no jaundice rash or appreciable lesions Extremities; no clubbing cyanosis or edema skin warm and dry Neuro/Psych; alert and oriented x4, grossly nonfocal mood and affect appropriate        Assessment & Plan:   #32 47 year old African-American male with onset of rectal bleeding about 2 weeks ago having intermittent episodes of black and dark red blood in the stool. He has had 2 ER visits and hemoglobin has been stable in the 15.8 range, BUN normal, LFTs normal. No imaging has been done  Etiology of his bleeding is not clear, it sounds lower however with patient on Eliquis also need to consider upper GI sources.  Cannot rule out bleeding secondary to internal hemorrhoids, occult neoplasm, other colonic lesion with ulceration, other colonic lesion with ulceration, peptic ulcer disease, AVMs  #  2 morbid obesity with BMI 51.8/weight 361 #3 congestive heart failure with EF 35 to 40% #4 atrial fibrillation-on chronic Eliquis #5 hypertension  Plan; patient will be scheduled for colonoscopy and EGD with Dr. Leonides Schanz at the hospital.  Both procedures were discussed in detail with the patient including indications risks and benefits and he is agreeable to proceed.  Eliquis will need to be held for 2 days prior to the procedure, we will communicate with his cardiologist Dr. Brett Albino to assure this is reasonable.  Repeat CBC today  Patient is advised that he has any larger volume bleeding or has several episodes in a short period of time that he should return to the emergency room for sooner evaluation.   Lucero Auzenne S Adlene Adduci PA-C 07/02/2023   Cc: [name], FNP

## 2023-07-03 ENCOUNTER — Telehealth: Payer: Self-pay

## 2023-07-03 NOTE — Transitions of Care (Post Inpatient/ED Visit) (Signed)
   07/03/2023  Name: Anthony Delgado MRN: [id number] DOB: 05/02/1976  Today's TOC FU Call Status: Today's TOC FU Call Status:: Successful TOC FU Call Completed TOC FU Call Complete Date: 07/03/23 Patient's Name and Date of Birth confirmed.  Transition Care Management Follow-up Telephone Call Date of Discharge: 06/25/23 Discharge Facility: [facility]) Type of Discharge: Inpatient Admission Primary Inpatient Discharge Diagnosis:: rectal bleeding How have you been since you were released from the hospital?: Better Any questions or concerns?: No  Items Reviewed: Did you receive and understand the discharge instructions provided?: Yes Medications obtained,verified, and reconciled?: Yes (Medications Reviewed) Any new allergies since your discharge?: No Dietary orders reviewed?: No Do you have support at home?: Yes People in Home: spouse  Medications Reviewed Today: Medications Reviewed Today   Medications were not reviewed in this encounter     Home Care and Equipment/Supplies: Were Home Health Services Ordered?: No Any new equipment or medical supplies ordered?: No  Functional Questionnaire: Do you need assistance with bathing/showering or dressing?: No Do you need assistance with meal preparation?: No Do you need assistance with eating?: No Do you have difficulty maintaining continence: No Do you need assistance with getting out of bed/getting out of a chair/moving?: No Do you have difficulty managing or taking your medications?: No  Follow up appointments reviewed: PCP Follow-up appointment confirmed?: No (patient reports he would like to just follow up with gastro) MD Provider Line Number:[phone] Given: Yes Specialist Hospital Follow-up appointment confirmed?: Yes Date of Specialist follow-up appointment?: 07/02/23 Follow-Up Specialty Provider:: gastro and heart dr Drucilla Schmidt you need transportation to your follow-up appointment?: No Do you understand care options if your  condition(s) worsen?: Yes-patient verbalized understanding    SIGNATURE Lisabeth Devoid, CMA

## 2023-07-04 NOTE — Telephone Encounter (Signed)
Left Anthony Delgado a detailed message to take his last dose of Eliquis 08/06/2023, hold it 08/07/2023 and 08/08/2023 for 08/09/2023 procedure. I requested he contact me back to let me know he got this message and understood.

## 2023-07-04 NOTE — Telephone Encounter (Signed)
Patient with diagnosis of afib on Eliquis for anticoagulation.    Procedure: EGD & Colonoscopy  Date of procedure: 08/09/2023   CHA2DS2-VASc Score = 2   This indicates a 2.2% annual risk of stroke. The patient's score is based upon: CHF History: 1 HTN History: 1 Diabetes History: 0 Stroke History: 0 Vascular Disease History: 0 Age Score: 0 Gender Score: 0     CrCl >100 mL/min  Platelet count 248 K (07/02/2023)    Per office protocol, patient can hold Eliquis for 2 days prior to procedure.     **This guidance is not considered finalized until pre-operative APP has relayed final recommendations.**

## 2023-07-04 NOTE — Telephone Encounter (Signed)
   Patient Name: Anthony Delgado  DOB: 1976/02/25 MRN: [id number]  Primary Cardiologist: Olga Millers, MD  Clinical pharmacists have reviewed the patient's past medical history, labs, and current medications as part of preoperative protocol coverage. The following recommendations have been made:  Per office protocol, patient can hold Eliquis for 2 days prior to procedure.    I will route this recommendation to the requesting party via Epic fax function and remove from pre-op pool.  Please call with questions.  Napoleon Form, Leodis Rains, NP 07/04/2023, 12:05 PM

## 2023-07-11 ENCOUNTER — Other Ambulatory Visit (HOSPITAL_COMMUNITY): Payer: Self-pay

## 2023-07-13 NOTE — Telephone Encounter (Signed)
Left Anthony Delgado another detailed voice mail message about holding his Eliquis. I asked that he call or MYCHART me so I know he got this message.

## 2023-07-18 ENCOUNTER — Ambulatory Visit
Admission: EM | Admit: 2023-07-18 | Discharge: 2023-07-18 | Disposition: A | Discharge status: Home / Self Care | Payer: Commercial Managed Care - PPO | Attending: Family Medicine | Admitting: Family Medicine

## 2023-07-18 ENCOUNTER — Encounter (HOSPITAL_COMMUNITY): Payer: Self-pay

## 2023-07-18 ENCOUNTER — Emergency Department (HOSPITAL_COMMUNITY)
Admission: EM | Admit: 2023-07-18 | Discharge: 2023-07-19 | Disposition: A | Discharge status: Home / Self Care | Payer: Commercial Managed Care - PPO | Attending: Emergency Medicine | Admitting: Emergency Medicine

## 2023-07-18 ENCOUNTER — Encounter: Payer: Self-pay | Admitting: Nurse Practitioner

## 2023-07-18 ENCOUNTER — Emergency Department (HOSPITAL_COMMUNITY): Payer: Commercial Managed Care - PPO

## 2023-07-18 ENCOUNTER — Encounter: Payer: Self-pay | Admitting: *Deleted

## 2023-07-18 ENCOUNTER — Other Ambulatory Visit: Payer: Self-pay

## 2023-07-18 DIAGNOSIS — R0602 Shortness of breath: Secondary | ICD-10-CM | POA: Diagnosis not present

## 2023-07-18 DIAGNOSIS — J069 Acute upper respiratory infection, unspecified: Secondary | ICD-10-CM | POA: Diagnosis not present

## 2023-07-18 DIAGNOSIS — B309 Viral conjunctivitis, unspecified: Secondary | ICD-10-CM | POA: Insufficient documentation

## 2023-07-18 DIAGNOSIS — I110 Hypertensive heart disease with heart failure: Secondary | ICD-10-CM | POA: Insufficient documentation

## 2023-07-18 DIAGNOSIS — R0902 Hypoxemia: Secondary | ICD-10-CM | POA: Diagnosis not present

## 2023-07-18 DIAGNOSIS — I509 Heart failure, unspecified: Secondary | ICD-10-CM | POA: Insufficient documentation

## 2023-07-18 DIAGNOSIS — J189 Pneumonia, unspecified organism: Secondary | ICD-10-CM | POA: Diagnosis not present

## 2023-07-18 DIAGNOSIS — Z7901 Long term (current) use of anticoagulants: Secondary | ICD-10-CM | POA: Diagnosis not present

## 2023-07-18 DIAGNOSIS — Z20822 Contact with and (suspected) exposure to covid-19: Secondary | ICD-10-CM | POA: Insufficient documentation

## 2023-07-18 DIAGNOSIS — Z8679 Personal history of other diseases of the circulatory system: Secondary | ICD-10-CM

## 2023-07-18 DIAGNOSIS — J9601 Acute respiratory failure with hypoxia: Secondary | ICD-10-CM

## 2023-07-18 LAB — RESP PANEL BY RT-PCR (RSV, FLU A&B, COVID)  RVPGX2
Influenza A by PCR: NEGATIVE
Influenza B by PCR: NEGATIVE
Resp Syncytial Virus by PCR: NEGATIVE
SARS Coronavirus 2 by RT PCR: NEGATIVE

## 2023-07-18 LAB — COMPREHENSIVE METABOLIC PANEL
ALT: 24 U/L (ref 0–44)
AST: 31 U/L (ref 15–41)
Albumin: 3.4 g/dL — ABNORMAL LOW (ref 3.5–5.0)
Alkaline Phosphatase: 56 U/L (ref 38–126)
Anion gap: 10 (ref 5–15)
BUN: 14 mg/dL (ref 6–20)
CO2: 23 mmol/L (ref 22–32)
Calcium: 9 mg/dL (ref 8.9–10.3)
Chloride: 102 mmol/L (ref 98–111)
Creatinine, Ser: 1.17 mg/dL (ref 0.61–1.24)
GFR, Estimated: >60 mL/min (ref >60)
Glucose, Bld: 88 mg/dL (ref 70–99)
Potassium: 4.1 mmol/L (ref 3.5–5.1)
Sodium: 135 mmol/L (ref 135–145)
Total Bilirubin: 0.8 mg/dL (ref <1.2)
Total Protein: 7.4 g/dL (ref 6.5–8.1)

## 2023-07-18 LAB — CBC
HCT: 45.3 % (ref 39.0–52.0)
Hemoglobin: 14.8 g/dL (ref 13.0–17.0)
MCH: 30.5 pg (ref 26.0–34.0)
MCHC: 32.7 g/dL (ref 30.0–36.0)
MCV: 93.2 fL (ref 80.0–100.0)
Platelets: 237 10*3/uL (ref 150–400)
RBC: 4.86 MIL/uL (ref 4.22–5.81)
RDW: 13.6 % (ref 11.5–15.5)
WBC: 8.1 10*3/uL (ref 4.0–10.5)
nRBC: 0 % (ref 0.0–0.2)

## 2023-07-18 MED ORDER — FUROSEMIDE 10 MG/ML IJ SOLN
80.0000 mg | Freq: Once | INTRAMUSCULAR | Status: AC
  Administered 2023-07-19: 80 mg via INTRAVENOUS
  Filled 2023-07-18: qty 8

## 2023-07-18 MED ORDER — IPRATROPIUM-ALBUTEROL 0.5-2.5 (3) MG/3ML IN SOLN
3.0000 mL | Freq: Once | RESPIRATORY_TRACT | Status: AC
  Administered 2023-07-18: 3 mL via RESPIRATORY_TRACT

## 2023-07-18 NOTE — ED Notes (Addendum)
Patient is being discharged from the Urgent Care and sent to the Emergency Department via POV . Per Dr. Tracie Harrier, patient is in need of higher level of care due to Low O2 sats. Patient is aware and verbalizes understanding of plan of care. Pt's wife is present to drive.  Vitals:   07/18/23 1530 07/18/23 1606  BP:    Pulse: 76   Resp:    Temp:    SpO2: (!) 89% (!) 88%

## 2023-07-18 NOTE — ED Notes (Signed)
Pt was stuck twice. Wasn't successful.

## 2023-07-18 NOTE — ED Provider Notes (Signed)
[place] EMERGENCY DEPARTMENT AT [facility] Provider Note   CSN: [id number] Arrival date & time: 07/18/23  1706     History {Add pertinent medical, surgical, social history, OB history to HPI:1} Chief Complaint  Patient presents with   Shortness of Breath    Anthony Delgado is a 47 y.o. male.  HPI     This is a 47 year old male with a history of atrial fibrillation who presents with upper respiratory symptoms.  Patient initially presented to urgent care.  He states that he went there for pinkeye and congestion.  He was noted to be hypoxic in the high 80s.  No history of asthma or smoking.  Does report that he takes Lasix daily but has been compliant has not noted any lower extremity edema.  Does report some shortness of breath but no recent cough.  Home Medications Prior to Admission medications   Medication Sig Start Date End Date Taking? Authorizing Provider  apixaban (ELIQUIS) 5 MG TABS tablet Take 1 tablet (5 mg total) by mouth 2 (two) times daily. 06/21/23   [name], FNP  carvedilol (COREG) 12.5 MG tablet Take 1 tablet (12.5 mg total) by mouth 2 (two) times daily. 06/21/23   [name], FNP  dapagliflozin propanediol (FARXIGA) 10 MG TABS tablet Take 1 tablet (10 mg total) by mouth daily. 04/02/23   [name], FNP  dofetilide (TIKOSYN) 500 MCG capsule Take 1 capsule (500 mcg total) by mouth 2 (two) times daily. 11/22/22   Camnitz, Andree Coss, MD  furosemide (LASIX) 40 MG tablet Take 1 tablet (40 mg total) by mouth 2 (two) times daily. 06/21/23   [name], FNP  hydrocortisone (ANUSOL-HC) 25 MG suppository Place 1 suppository (25 mg total) rectally 2 (two) times daily for 7 days. 06/25/23   Pricilla Loveless, MD  spironolactone (ALDACTONE) 25 MG tablet Take 1/2 tablet (12.5 mg total) by mouth daily. 06/21/23   [name], FNP      Allergies    Patient has no known allergies.    Review of Systems   Review of Systems  Constitutional:  Negative for  fever.  Respiratory:  Positive for shortness of breath. Negative for cough.   Cardiovascular:  Negative for chest pain.  Gastrointestinal:  Negative for nausea and vomiting.  All other systems reviewed and are negative.   Physical Exam Updated Vital Signs BP 133/82   Pulse 77   Temp 98.6 F (37 C)   Resp (!) 24   Ht 1.778 m (5\' 10" )   Wt (!) 163.8 kg   SpO2 93%   BMI 51.81 kg/m  Physical Exam Vitals and nursing note reviewed.  Constitutional:      Appearance: He is well-developed. He is obese. He is not ill-appearing.  HENT:     Head: Normocephalic and atraumatic.     Nose: Congestion present.  Eyes:     Pupils: Pupils are equal, round, and reactive to light.  Cardiovascular:     Rate and Rhythm: Normal rate and regular rhythm.     Heart sounds: Normal heart sounds. No murmur heard. Pulmonary:     Effort: Pulmonary effort is normal. No respiratory distress.     Breath sounds: Normal breath sounds. No wheezing or rhonchi.  Abdominal:     General: Bowel sounds are normal.     Palpations: Abdomen is soft.     Tenderness: There is no abdominal tenderness. There is no rebound.  Musculoskeletal:     Cervical back: Neck supple.  Comments: Trace bilateral lower extremity edema  Lymphadenopathy:     Cervical: No cervical adenopathy.  Skin:    General: Skin is warm and dry.  Neurological:     Mental Status: He is alert and oriented to person, place, and time.  Psychiatric:        Mood and Affect: Mood normal.     ED Results / Procedures / Treatments   Labs (all labs ordered are listed, but only abnormal results are displayed) Labs Reviewed  COMPREHENSIVE METABOLIC PANEL - Abnormal; Notable for the following components:      Result Value   Albumin 3.4 (*)    All other components within normal limits  RESP PANEL BY RT-PCR (RSV, FLU A&B, COVID)  RVPGX2  CBC  BRAIN NATRIURETIC PEPTIDE    EKG EKG Interpretation Date/Time:  Wednesday July 18 2023 17:27:23  EST Ventricular Rate:  75 PR Interval:  192 QRS Duration:  72 QT Interval:  394 QTC Calculation: 439 R Axis:   36  Text Interpretation: Sinus rhythm with Premature atrial complexes Otherwise normal ECG When compared with ECG of 05-Jul-2022 12:01, PREVIOUS ECG IS PRESENT Confirmed by Ross Marcus ([id number]) on 07/18/2023 10:56:54 PM  Radiology DG Chest 1 View  Result Date: 07/18/2023 CLINICAL DATA:  Shortness of breath. EXAM: CHEST  1 VIEW COMPARISON:  June 02, 2022 FINDINGS: The heart size and mediastinal contours are within normal limits. There is mild prominence of the central pulmonary vasculature. Mild areas of atelectasis and/or infiltrate are seen within the bilateral lung bases. No pleural effusion or pneumothorax is identified. The visualized skeletal structures are unremarkable. IMPRESSION: Findings suggestive of mild central pulmonary vascular congestion with mild bibasilar atelectasis and/or infiltrate. Electronically Signed   By: Aram Candela M.D.   On: 07/18/2023 20:34    Procedures Procedures  {Document cardiac monitor, telemetry assessment procedure when appropriate:1}  Medications Ordered in ED Medications  furosemide (LASIX) injection 80 mg (has no administration in time range)    ED Course/ Medical Decision Making/ A&P   {   Click here for ABCD2, HEART and other calculatorsREFRESH Note before signing :1}                              Medical Decision Making Amount and/or Complexity of Data Reviewed Labs: ordered.  Risk Prescription drug management.   ***  {Document critical care time when appropriate:1} {Document review of labs and clinical decision tools ie heart score, Chads2Vasc2 etc:1}  {Document your independent review of radiology images, and any outside records:1} {Document your discussion with family members, caretakers, and with consultants:1} {Document social determinants of health affecting pt's care:1} {Document your decision making  why or why not admission, treatments were needed:1} Final Clinical Impression(s) / ED Diagnoses Final diagnoses:  None    Rx / DC Orders ED Discharge Orders     None

## 2023-07-18 NOTE — ED Triage Notes (Signed)
Pt c/o SOBx3d. Pt sent by UC for hypoxia 87% on RA

## 2023-07-18 NOTE — Discharge Instructions (Signed)
Meds ordered this encounter  Medications   ipratropium-albuterol (DUONEB) 0.5-2.5 (3) MG/3ML nebulizer solution 3 mL

## 2023-07-18 NOTE — ED Triage Notes (Signed)
Pt states his son had pink eye. He woke up 2 days ago with eyes matted, also has redness, drainage, and c/o eyes are sore. Also c/o 3 days of cough and had fever last night 100.3. He needs a note for work

## 2023-07-19 ENCOUNTER — Telehealth: Payer: Self-pay

## 2023-07-19 ENCOUNTER — Other Ambulatory Visit (HOSPITAL_COMMUNITY): Payer: Self-pay

## 2023-07-19 ENCOUNTER — Other Ambulatory Visit: Payer: Self-pay | Admitting: Nurse Practitioner

## 2023-07-19 ENCOUNTER — Other Ambulatory Visit: Payer: Self-pay

## 2023-07-19 LAB — BRAIN NATRIURETIC PEPTIDE: B Natriuretic Peptide: 47.2 pg/mL (ref 0.0–100.0)

## 2023-07-19 MED ORDER — AZITHROMYCIN 250 MG PO TABS
ORAL_TABLET | ORAL | 0 refills | Status: AC
  Filled 2023-07-19: qty 6, 5d supply, fill #0

## 2023-07-19 MED ORDER — AMOXICILLIN 500 MG PO CAPS
1000.0000 mg | ORAL_CAPSULE | Freq: Once | ORAL | Status: AC
  Administered 2023-07-19: 1000 mg via ORAL
  Filled 2023-07-19: qty 2

## 2023-07-19 MED ORDER — AMOXICILLIN 500 MG PO CAPS
1000.0000 mg | ORAL_CAPSULE | Freq: Three times a day (TID) | ORAL | 0 refills | Status: DC
  Filled 2023-07-19: qty 42, 7d supply, fill #0

## 2023-07-19 MED ORDER — POLYMYXIN B-TRIMETHOPRIM 10000-0.1 UNIT/ML-% OP SOLN
1.0000 [drp] | OPHTHALMIC | 0 refills | Status: DC
  Filled 2023-07-19: qty 10, 30d supply, fill #0

## 2023-07-19 MED ORDER — APIXABAN 5 MG PO TABS
5.0000 mg | ORAL_TABLET | Freq: Two times a day (BID) | ORAL | 0 refills | Status: DC
  Filled 2023-07-19: qty 60, 30d supply, fill #0

## 2023-07-19 NOTE — Telephone Encounter (Signed)
I was attempting to reach Anthony Delgado again about holding his Eliquis when I noticed he was seen in the ED yesterday 07/18/2023 for several reasons. One was SOB. He has an ECHO appointment 07/23/2023. His ECL is set up for 08/09/2023 at [facility] ENDO. I wanted you to be aware of these new developments. Please advise if this changes the plan. Thank You.

## 2023-07-19 NOTE — ED Notes (Signed)
Pt in NAD at d/c from ED. A&O. Ambulatory. Respirations even & unlabored. Skin warm & dry. Pt verbalized understanding of d/c teaching including follow up care, medications and reasons to return to the ED. No needs or questions expressed at d/c.

## 2023-07-19 NOTE — Discharge Instructions (Signed)
You were seen today for low oxygen saturations and concern for upper respiratory symptoms.  Your shortness of breath is likely related to multiple factors including your history of heart failure.  Your chest x-ray indicates potentially early pneumonia as well.  You will be treated with antibiotics.  Make sure that you continue to take your Lasix.

## 2023-07-19 NOTE — ED Notes (Signed)
Pt ambulated with pulse ox monitoring. Pts SpO2 ranged from 92-93% while ambulating. Pt states he feels his breathing has improved form arrival.

## 2023-07-19 NOTE — ED Provider Notes (Signed)
[facility]   [id number] 07/18/23 Arrival Time: 1309  ASSESSMENT & PLAN:  1. SOB (shortness of breath)   2. Hypoxia    X-ray unavailable here today. With O2 requirement. To ED via POV for further evaluation; stable upon discharge.   Follow-up Information     Go to  [facility] Emergency Department at [facility].   Specialty: Emergency Medicine Contact information: [address] [place] [address] [phone]                Reviewed expectations re: course of current medical issues. Questions answered. Outlined signs and symptoms indicating need for more acute intervention. Understanding verbalized. After Visit Summary given.   SUBJECTIVE: History from: Patient and Family. Chevas Colyer is a 47 y.o. male. Reports: feeling bad; several days; eyes irritated; coughing x 3 days; Tmax 100.4F yest evening. Does feel SOB at times. Denies CP. Normal PO intake without n/v/d.  OBJECTIVE:  Vitals:   07/18/23 1452 07/18/23 1530 07/18/23 1606  BP: 127/78    Pulse: 80 76   Resp: 20    Temp: 98.8 F (37.1 C)    TempSrc: Oral    SpO2: 92% (!) 89% (!) 88%    General appearance: alert; no distress; appears very fatigued Eyes: PERRLA; EOMI; conjunctiva normal HENT: [place]; AT; with nasal congestion Neck: supple  CV: regular Lungs: speaks full sentences without difficulty; unlabored Extremities: trace bilateral edema Skin: warm and dry Neurologic: normal gait Psychological: alert and cooperative; normal mood and affect  No Known Allergies  Past Medical History:  Diagnosis Date   A-fib (HCC)    Congestive heart failure (CHF) (HCC)    Hypertension    TIA (transient ischemic attack)    Social History   Socioeconomic History   Marital status: Married    Spouse name: Harriett Sine   Number of children: 4   Years of education: Not on file   Highest education level: High school graduate  Occupational History   Occupation:  Ecologist  Tobacco Use   Smoking status: Never   Smokeless tobacco: Never  Vaping Use   Vaping status: Never Used  Substance and Sexual Activity   Alcohol use: No   Drug use: No   Sexual activity: Yes    Partners: Female  Other Topics Concern   Not on file  Social History Narrative   Not on file   Social Determinants of Health   Financial Resource Strain: Low Risk  (06/01/2022)   Overall Financial Resource Strain (CARDIA)    Difficulty of Paying Living Expenses: Not hard at all  Food Insecurity: No Food Insecurity (06/20/2022)   Hunger Vital Sign    Worried About Running Out of Food in the Last Year: Never true    Ran Out of Food in the Last Year: Never true  Transportation Needs: No Transportation Needs (06/20/2022)   PRAPARE - Administrator, Civil Service (Medical): No    Lack of Transportation (Non-Medical): No  Physical Activity: Not on file  Stress: Not on file  Social Connections: Not on file  Intimate Partner Violence: Not At Risk (06/20/2022)   Humiliation, Afraid, Rape, and Kick questionnaire    Fear of Current or Ex-Partner: No    Emotionally Abused: No    Physically Abused: No    Sexually Abused: No   Family History  Problem Relation Age of Onset   Hypertension Mother    Kidney failure Father    Hypertension Sister  Prostate cancer Maternal Uncle    Hypertension Maternal Uncle    Stroke Neg Hx        None at early ages   CAD Neg Hx        None at early ages   Past Surgical History:  Procedure Laterality Date   CARDIOVERSION N/A 06/01/2022   Procedure: CARDIOVERSION;  Surgeon: Jake Bathe, MD;  Location: [facility] ENDOSCOPY;  Service: Cardiovascular;  Laterality: N/A;   CARDIOVERSION N/A 06/22/2022   Procedure: CARDIOVERSION;  Surgeon: Sande Rives, MD;  Location: [facility] ENDOSCOPY;  Service: Cardiovascular;  Laterality: N/A;   CARDIOVERSION N/A 07/05/2022   Procedure: CARDIOVERSION;  Surgeon: Thomasene Ripple, DO;  Location: [facility] ENDOSCOPY;   Service: Cardiovascular;  Laterality: N/A;   CHOLECYSTECTOMY  2009   TEE WITHOUT CARDIOVERSION N/A 06/01/2022   Procedure: TRANSESOPHAGEAL ECHOCARDIOGRAM (TEE);  Surgeon: Jake Bathe, MD;  Location: [facility] ENDOSCOPY;  Service: Cardiovascular;  Laterality: N/AMardella Layman, MD 07/19/23 1005

## 2023-07-23 ENCOUNTER — Ambulatory Visit (HOSPITAL_COMMUNITY): Payer: Commercial Managed Care - PPO | Attending: Cardiovascular Disease

## 2023-07-23 DIAGNOSIS — I4819 Other persistent atrial fibrillation: Secondary | ICD-10-CM | POA: Insufficient documentation

## 2023-07-23 DIAGNOSIS — I5022 Chronic systolic (congestive) heart failure: Secondary | ICD-10-CM | POA: Diagnosis not present

## 2023-07-23 LAB — ECHOCARDIOGRAM COMPLETE
Calc EF: 59.4 %
S' Lateral: 3.09 cm
Single Plane A2C EF: 59.1 %
Single Plane A4C EF: 60.1 %

## 2023-07-24 ENCOUNTER — Other Ambulatory Visit (HOSPITAL_COMMUNITY): Payer: Self-pay

## 2023-07-24 ENCOUNTER — Encounter (HOSPITAL_COMMUNITY): Payer: Self-pay

## 2023-07-25 ENCOUNTER — Other Ambulatory Visit: Payer: Self-pay

## 2023-07-25 ENCOUNTER — Other Ambulatory Visit (HOSPITAL_COMMUNITY): Payer: Self-pay

## 2023-08-01 ENCOUNTER — Telehealth: Payer: Self-pay | Admitting: Internal Medicine

## 2023-08-01 ENCOUNTER — Other Ambulatory Visit (HOSPITAL_COMMUNITY): Payer: Self-pay

## 2023-08-01 NOTE — Telephone Encounter (Signed)
Inbound call from patient wanting to inform he did receive previous messages regarding holding Eliquis for 12/12 procedure. Please advise, thank you.

## 2023-08-01 NOTE — Telephone Encounter (Signed)
I have left Alois another detailed voice mail message to please let me know he got the message about holding his Eliquis.

## 2023-08-01 NOTE — Telephone Encounter (Signed)
Error

## 2023-08-01 NOTE — Telephone Encounter (Signed)
I reached his wife Harriett Sine and relayed the message to her. She is going to tell Bocephus and she wrote it down.

## 2023-08-07 ENCOUNTER — Ambulatory Visit (INDEPENDENT_AMBULATORY_CARE_PROVIDER_SITE_OTHER): Payer: Commercial Managed Care - PPO | Admitting: Nurse Practitioner

## 2023-08-07 ENCOUNTER — Encounter: Payer: Self-pay | Admitting: Nurse Practitioner

## 2023-08-07 ENCOUNTER — Other Ambulatory Visit (HOSPITAL_COMMUNITY): Payer: Self-pay

## 2023-08-07 VITALS — BP 120/80 | HR 98 | Temp 98.7°F | Ht 70.0 in | Wt 356.0 lb

## 2023-08-07 DIAGNOSIS — Z6841 Body Mass Index (BMI) 40.0 and over, adult: Secondary | ICD-10-CM

## 2023-08-07 DIAGNOSIS — R7303 Prediabetes: Secondary | ICD-10-CM | POA: Diagnosis not present

## 2023-08-07 DIAGNOSIS — E662 Morbid (severe) obesity with alveolar hypoventilation: Secondary | ICD-10-CM

## 2023-08-07 DIAGNOSIS — Z23 Encounter for immunization: Secondary | ICD-10-CM | POA: Insufficient documentation

## 2023-08-07 DIAGNOSIS — E782 Mixed hyperlipidemia: Secondary | ICD-10-CM | POA: Diagnosis not present

## 2023-08-07 DIAGNOSIS — E66813 Obesity, class 3: Secondary | ICD-10-CM

## 2023-08-07 DIAGNOSIS — J189 Pneumonia, unspecified organism: Secondary | ICD-10-CM | POA: Insufficient documentation

## 2023-08-07 DIAGNOSIS — I110 Hypertensive heart disease with heart failure: Secondary | ICD-10-CM | POA: Diagnosis not present

## 2023-08-07 DIAGNOSIS — K625 Hemorrhage of anus and rectum: Secondary | ICD-10-CM | POA: Insufficient documentation

## 2023-08-07 MED ORDER — LEVALBUTEROL TARTRATE 45 MCG/ACT IN AERO
2.0000 | INHALATION_SPRAY | Freq: Four times a day (QID) | RESPIRATORY_TRACT | 2 refills | Status: DC | PRN
  Filled 2023-08-07: qty 15, 25d supply, fill #0
  Filled 2023-09-21: qty 15, 25d supply, fill #1

## 2023-08-07 MED ORDER — BENZONATATE 100 MG PO CAPS
100.0000 mg | ORAL_CAPSULE | Freq: Four times a day (QID) | ORAL | 1 refills | Status: DC | PRN
  Filled 2023-08-07: qty 30, 8d supply, fill #0
  Filled 2023-08-23: qty 30, 8d supply, fill #1

## 2023-08-07 NOTE — Assessment & Plan Note (Signed)
he is encouraged to strive for BMI less than 30 to decrease cardiac risk. Advised to aim for at least 150 minutes of exercise per week.  Continue getting in physical activity

## 2023-08-07 NOTE — Assessment & Plan Note (Signed)
Hemoglobin A1c is stable.  Will recheck today

## 2023-08-07 NOTE — Assessment & Plan Note (Signed)
Lung sounds are clear bilaterally, he was treated for pneumonia in November will repeat CXR next week. Tessalon perles given and Xopenex inhaler as needed.

## 2023-08-07 NOTE — Assessment & Plan Note (Signed)
Cholesterol levels were elevated at last visit, continue statin.  Will check lipid panel today.

## 2023-08-07 NOTE — Progress Notes (Signed)
Madelaine Bhat, CMA,acting as a Neurosurgeon for [name], FNP.,have documented all relevant documentation on the behalf of [name], FNP,as directed by  [name], FNP while in the presence of [name], FNP.  Subjective:  Patient ID: Anthony Delgado , male    DOB: 11-Jul-1976 , 47 y.o.   MRN: [id number]  Chief Complaint  Patient presents with   Hypertension    HPI  Patient presents today for a bp and pre dm follow up, Patient reports compliance with medication. Patient denies any chest pain, SOB, or headaches. Patient has no concerns today. He is going to his GI provider on Thursday for his colonoscopy with Dr. Leonides Schanz. No changes with Cardiology. He is off eliquis for his procedure since yesterday.   Hypertension This is a chronic problem. The current episode started more than 1 year ago. The problem has been gradually improving since onset. Pertinent negatives include no anxiety. Risk factors for coronary artery disease include obesity and dyslipidemia. Hypertensive end-organ damage includes heart failure. There is no history of angina.     Past Medical History:  Diagnosis Date   A-fib (HCC)    Abnormal LFTs 07/06/2012   Cellulitis and abscess of trunk 07/06/2012   Congestive heart failure (CHF) (HCC)    Diarrhea 07/06/2012   Encephalopathy acute 07/06/2012   Hypertension    Hyponatremia 07/06/2012   Nausea & vomiting 07/06/2012   Staph aureus infection 07/06/2012   TIA (transient ischemic attack)    TIA (transient ischemic attack) 04/02/2013     Family History  Problem Relation Age of Onset   Hypertension Mother    Kidney failure Father    Hypertension Sister    Prostate cancer Maternal Uncle    Hypertension Maternal Uncle    Stroke Neg Hx        None at early ages   CAD Neg Hx        None at early ages     Current Outpatient Medications:    apixaban (ELIQUIS) 5 MG TABS tablet, Take 1 tablet (5 mg total) by mouth 2 (two) times daily., Disp: 60 tablet, Rfl:  0   benzonatate (TESSALON PERLES) 100 MG capsule, Take 1 capsule (100 mg total) by mouth every 6 (six) hours as needed., Disp: 30 capsule, Rfl: 1   carvedilol (COREG) 12.5 MG tablet, Take 1 tablet (12.5 mg total) by mouth 2 (two) times daily., Disp: 180 tablet, Rfl: 0   dapagliflozin propanediol (FARXIGA) 10 MG TABS tablet, Take 1 tablet (10 mg total) by mouth daily., Disp: 90 tablet, Rfl: 1   dofetilide (TIKOSYN) 500 MCG capsule, Take 1 capsule (500 mcg total) by mouth 2 (two) times daily., Disp: 180 capsule, Rfl: 2   furosemide (LASIX) 40 MG tablet, Take 1 tablet (40 mg total) by mouth 2 (two) times daily., Disp: 60 tablet, Rfl: 1   hydrocortisone (ANUSOL-HC) 25 MG suppository, Place 1 suppository (25 mg total) rectally 2 (two) times daily for 7 days., Disp: 14 suppository, Rfl: 0   levalbuterol (XOPENEX HFA) 45 MCG/ACT inhaler, Inhale 2 puffs into the lungs every 6 (six) hours as needed for wheezing., Disp: 15 g, Rfl: 2   spironolactone (ALDACTONE) 25 MG tablet, Take 1/2 tablet (12.5 mg total) by mouth daily., Disp: 15 tablet, Rfl: 1   trimethoprim-polymyxin b (POLYTRIM) ophthalmic solution, Place 1 drop into both eyes every 4 (four) hours., Disp: 10 mL, Rfl: 0   No Known Allergies   Review of Systems  Constitutional: Negative.  HENT: Negative.    Eyes: Negative.   Respiratory: Negative.    Cardiovascular: Negative.   Gastrointestinal: Negative.   Neurological: Negative.   Psychiatric/Behavioral: Negative.       Today's Vitals   08/07/23 1602  BP: 120/80  Pulse: 98  Temp: 98.7 F (37.1 C)  TempSrc: Oral  Weight: (!) 356 lb (161.5 kg)  Height: 5\' 10"  (1.778 m)  PainSc: 0-No pain   Body mass index is 51.08 kg/m.  Wt Readings from Last 3 Encounters:  08/07/23 (!) 356 lb (161.5 kg)  07/18/23 (!) 361 lb 1.8 oz (163.8 kg)  07/02/23 (!) 361 lb 2 oz (163.8 kg)      Objective:  Physical Exam Vitals reviewed.  Constitutional:      General: He is not in acute distress.     Appearance: Normal appearance. He is obese.  Cardiovascular:     Rate and Rhythm: Normal rate and regular rhythm.     Pulses: Normal pulses.     Heart sounds: Normal heart sounds. No murmur heard. Pulmonary:     Effort: No respiratory distress.     Breath sounds: Normal breath sounds. No wheezing.  Musculoskeletal:        General: No swelling.  Skin:    General: Skin is warm and dry.     Capillary Refill: Capillary refill takes less than 2 seconds.  Neurological:     General: No focal deficit present.     Mental Status: He is alert and oriented to person, place, and time.     Cranial Nerves: No cranial nerve deficit.     Motor: No weakness.  Psychiatric:        Mood and Affect: Mood normal.        Behavior: Behavior normal.        Thought Content: Thought content normal.        Judgment: Judgment normal.         Assessment And Plan:  Malignant essential hypertension with congestive heart failure (HCC) Assessment & Plan: Blood pressure is fairly controlled.  Continue current medications and follow-up with cardiologist.  Orders: -     Basic metabolic panel  Mixed hyperlipidemia Assessment & Plan: Cholesterol levels were elevated at last visit, continue statin.  Will check lipid panel today.  Orders: -     Basic metabolic panel -     Lipid panel  Prediabetes Assessment & Plan: Hemoglobin A1c is stable.  Will recheck today  Orders: -     Basic metabolic panel -     Hemoglobin A1c  Rectal bleeding Assessment & Plan: He has been having several episodes of rectal bleeding he is having a colonoscopy on Thursday with Dr. Leonides Schanz   Pneumonia of both lower lobes due to infectious organism Assessment & Plan: Lung sounds are clear bilaterally, he was treated for pneumonia in November will repeat CXR next week. Tessalon perles given and Xopenex inhaler as needed.   Orders: -     DG Chest 2 View; Future -     Benzonatate; Take 1 capsule (100 mg total) by mouth every 6  (six) hours as needed.  Dispense: 30 capsule; Refill: 1  Need for influenza vaccination Assessment & Plan: Influenza vaccine administered Encouraged to take Tylenol as needed for fever or muscle aches.   Orders: -     Flu vaccine trivalent PF, 6mos and older(Flulaval,Afluria,Fluarix,Fluzone)  COVID-19 vaccine administered Assessment & Plan: Covid 19 vaccine given in office observed for 15 minutes without any  adverse reaction   Orders: -     Pfizer Comirnaty Covid-19 Vaccine 21yrs & older  Class 3 obesity with alveolar hypoventilation, serious comorbidity, and body mass index (BMI) of 50.0 to 59.9 in adult [facility]) Assessment & Plan: he is encouraged to strive for BMI less than 30 to decrease cardiac risk. Advised to aim for at least 150 minutes of exercise per week.  Continue getting in physical activity    Other orders -     Levalbuterol Tartrate; Inhale 2 puffs into the lungs every 6 (six) hours as needed for wheezing.  Dispense: 15 g; Refill: 2   Return for keep same next.  Patient was given opportunity to ask questions. Patient verbalized understanding of the plan and was able to repeat key elements of the plan. All questions were answered to their satisfaction.    Jeanell Sparrow, FNP, have reviewed all documentation for this visit. The documentation on 08/07/23 for the exam, diagnosis, procedures, and orders are all accurate and complete.   IF YOU HAVE BEEN REFERRED TO A SPECIALIST, IT MAY TAKE 1-2 WEEKS TO SCHEDULE/PROCESS THE REFERRAL. IF YOU HAVE NOT HEARD FROM US/SPECIALIST IN TWO WEEKS, PLEASE GIVE [place] A CALL AT [phone].

## 2023-08-07 NOTE — Assessment & Plan Note (Signed)
Influenza vaccine administered Encouraged to take Tylenol as needed for fever or muscle aches.

## 2023-08-07 NOTE — Assessment & Plan Note (Signed)
Covid 19 vaccine given in office observed for 15 minutes without any adverse reaction  

## 2023-08-07 NOTE — Assessment & Plan Note (Signed)
He has been having several episodes of rectal bleeding he is having a colonoscopy on Thursday with Dr. Leonides Schanz

## 2023-08-07 NOTE — Assessment & Plan Note (Signed)
Blood pressure is fairly controlled.  Continue current medications and follow-up with cardiologist.

## 2023-08-08 LAB — LIPID PANEL
Chol/HDL Ratio: 6.6 {ratio} — ABNORMAL HIGH (ref 0.0–5.0)
Cholesterol, Total: 172 mg/dL (ref 100–199)
HDL: 26 mg/dL — ABNORMAL LOW (ref >39)
LDL Chol Calc (NIH): 112 mg/dL — ABNORMAL HIGH (ref 0–99)
Triglycerides: 189 mg/dL — ABNORMAL HIGH (ref 0–149)
VLDL Cholesterol Cal: 34 mg/dL (ref 5–40)

## 2023-08-08 LAB — BASIC METABOLIC PANEL
BUN/Creatinine Ratio: 11 (ref 9–20)
BUN: 14 mg/dL (ref 6–24)
CO2: 22 mmol/L (ref 20–29)
Calcium: 8.8 mg/dL (ref 8.7–10.2)
Chloride: 101 mmol/L (ref 96–106)
Creatinine, Ser: 1.33 mg/dL — ABNORMAL HIGH (ref 0.76–1.27)
Glucose: 118 mg/dL — ABNORMAL HIGH (ref 70–99)
Potassium: 4.1 mmol/L (ref 3.5–5.2)
Sodium: 136 mmol/L (ref 134–144)
eGFR: 66 mL/min/{1.73_m2} (ref >59)

## 2023-08-08 LAB — HEMOGLOBIN A1C
Est. average glucose Bld gHb Est-mCnc: 137 mg/dL
Hgb A1c MFr Bld: 6.4 % — ABNORMAL HIGH (ref 4.8–5.6)

## 2023-08-09 ENCOUNTER — Other Ambulatory Visit: Payer: Self-pay

## 2023-08-09 ENCOUNTER — Encounter (HOSPITAL_COMMUNITY)
Admission: RE | Disposition: A | Discharge status: Home / Self Care | Payer: Self-pay | Source: Home / Self Care | Attending: Internal Medicine

## 2023-08-09 ENCOUNTER — Encounter (HOSPITAL_COMMUNITY): Payer: Self-pay | Admitting: Internal Medicine

## 2023-08-09 ENCOUNTER — Other Ambulatory Visit (HOSPITAL_COMMUNITY): Payer: Self-pay

## 2023-08-09 ENCOUNTER — Ambulatory Visit (HOSPITAL_COMMUNITY)
Admission: RE | Admit: 2023-08-09 | Discharge: 2023-08-09 | Disposition: A | Discharge status: Home / Self Care | Payer: Commercial Managed Care - PPO | Attending: Internal Medicine | Admitting: Internal Medicine

## 2023-08-09 ENCOUNTER — Ambulatory Visit (HOSPITAL_COMMUNITY): Payer: Commercial Managed Care - PPO | Admitting: Anesthesiology

## 2023-08-09 DIAGNOSIS — K31A0 Gastric intestinal metaplasia, unspecified: Secondary | ICD-10-CM

## 2023-08-09 DIAGNOSIS — E669 Obesity, unspecified: Secondary | ICD-10-CM | POA: Insufficient documentation

## 2023-08-09 DIAGNOSIS — N289 Disorder of kidney and ureter, unspecified: Secondary | ICD-10-CM | POA: Insufficient documentation

## 2023-08-09 DIAGNOSIS — D123 Benign neoplasm of transverse colon: Secondary | ICD-10-CM | POA: Diagnosis not present

## 2023-08-09 DIAGNOSIS — I10 Essential (primary) hypertension: Secondary | ICD-10-CM | POA: Diagnosis not present

## 2023-08-09 DIAGNOSIS — D120 Benign neoplasm of cecum: Secondary | ICD-10-CM | POA: Diagnosis not present

## 2023-08-09 DIAGNOSIS — K921 Melena: Secondary | ICD-10-CM

## 2023-08-09 DIAGNOSIS — J45909 Unspecified asthma, uncomplicated: Secondary | ICD-10-CM | POA: Diagnosis not present

## 2023-08-09 DIAGNOSIS — K5140 Inflammatory polyps of colon without complications: Secondary | ICD-10-CM

## 2023-08-09 DIAGNOSIS — K2970 Gastritis, unspecified, without bleeding: Secondary | ICD-10-CM | POA: Diagnosis not present

## 2023-08-09 DIAGNOSIS — K51411 Inflammatory polyps of colon with rectal bleeding: Secondary | ICD-10-CM | POA: Diagnosis not present

## 2023-08-09 DIAGNOSIS — Z7984 Long term (current) use of oral hypoglycemic drugs: Secondary | ICD-10-CM | POA: Diagnosis not present

## 2023-08-09 DIAGNOSIS — I890 Lymphedema, not elsewhere classified: Secondary | ICD-10-CM | POA: Insufficient documentation

## 2023-08-09 DIAGNOSIS — Z79899 Other long term (current) drug therapy: Secondary | ICD-10-CM | POA: Insufficient documentation

## 2023-08-09 DIAGNOSIS — K2980 Duodenitis without bleeding: Secondary | ICD-10-CM | POA: Insufficient documentation

## 2023-08-09 DIAGNOSIS — K625 Hemorrhage of anus and rectum: Secondary | ICD-10-CM

## 2023-08-09 DIAGNOSIS — K3189 Other diseases of stomach and duodenum: Secondary | ICD-10-CM | POA: Diagnosis not present

## 2023-08-09 DIAGNOSIS — R0683 Snoring: Secondary | ICD-10-CM | POA: Insufficient documentation

## 2023-08-09 DIAGNOSIS — Z7901 Long term (current) use of anticoagulants: Secondary | ICD-10-CM | POA: Insufficient documentation

## 2023-08-09 DIAGNOSIS — K319 Disease of stomach and duodenum, unspecified: Secondary | ICD-10-CM | POA: Diagnosis not present

## 2023-08-09 DIAGNOSIS — K648 Other hemorrhoids: Secondary | ICD-10-CM | POA: Diagnosis not present

## 2023-08-09 DIAGNOSIS — Z6841 Body Mass Index (BMI) 40.0 and over, adult: Secondary | ICD-10-CM | POA: Diagnosis not present

## 2023-08-09 DIAGNOSIS — I4891 Unspecified atrial fibrillation: Secondary | ICD-10-CM | POA: Diagnosis not present

## 2023-08-09 HISTORY — PX: COLONOSCOPY WITH PROPOFOL: SHX5780

## 2023-08-09 HISTORY — PX: POLYPECTOMY: SHX5525

## 2023-08-09 HISTORY — PX: BIOPSY: SHX5522

## 2023-08-09 HISTORY — PX: ESOPHAGOGASTRODUODENOSCOPY (EGD) WITH PROPOFOL: SHX5813

## 2023-08-09 SURGERY — ESOPHAGOGASTRODUODENOSCOPY (EGD) WITH PROPOFOL
Anesthesia: Monitor Anesthesia Care

## 2023-08-09 MED ORDER — GLYCOPYRROLATE 0.2 MG/ML IJ SOLN
INTRAMUSCULAR | Status: DC | PRN
  Administered 2023-08-09: .2 mg via INTRAVENOUS

## 2023-08-09 MED ORDER — METOPROLOL TARTRATE 5 MG/5ML IV SOLN
INTRAVENOUS | Status: DC | PRN
  Administered 2023-08-09: 1 mg via INTRAVENOUS
  Administered 2023-08-09: 2 mg via INTRAVENOUS
  Administered 2023-08-09 (×2): 1 mg via INTRAVENOUS

## 2023-08-09 MED ORDER — SODIUM CHLORIDE 0.9 % IV SOLN
INTRAVENOUS | Status: DC | PRN
Start: 2023-08-09 — End: 2023-08-09

## 2023-08-09 MED ORDER — PANTOPRAZOLE SODIUM 40 MG PO TBEC
40.0000 mg | DELAYED_RELEASE_TABLET | Freq: Every day | ORAL | 1 refills | Status: DC
  Filled 2023-08-09: qty 30, 30d supply, fill #0
  Filled 2023-12-30: qty 30, 30d supply, fill #1

## 2023-08-09 MED ORDER — PROPOFOL 10 MG/ML IV BOLUS
INTRAVENOUS | Status: DC | PRN
  Administered 2023-08-09: 10 mg via INTRAVENOUS
  Administered 2023-08-09 (×4): 20 mg via INTRAVENOUS

## 2023-08-09 MED ORDER — HYDROCORTISONE (PERIANAL) 2.5 % EX CREA
1.0000 | TOPICAL_CREAM | Freq: Two times a day (BID) | CUTANEOUS | 0 refills | Status: AC
  Filled 2023-08-09: qty 30, 10d supply, fill #0

## 2023-08-09 MED ORDER — METOPROLOL TARTRATE 5 MG/5ML IV SOLN
INTRAVENOUS | Status: AC
  Filled 2023-08-09: qty 5

## 2023-08-09 MED ORDER — PROPOFOL 500 MG/50ML IV EMUL
INTRAVENOUS | Status: DC | PRN
  Administered 2023-08-09: 75 ug/kg/min via INTRAVENOUS

## 2023-08-09 MED ORDER — LIDOCAINE HCL (CARDIAC) PF 100 MG/5ML IV SOSY
PREFILLED_SYRINGE | INTRAVENOUS | Status: DC | PRN
  Administered 2023-08-09: 100 mg via INTRAVENOUS

## 2023-08-09 MED ORDER — SODIUM CHLORIDE 0.9 % IV SOLN: INTRAVENOUS | Status: DC

## 2023-08-09 SURGICAL SUPPLY — 24 items
BLOCK BITE 60FR ADLT L/F BLUE (MISCELLANEOUS) ×2 IMPLANT
ELECT REM PT RETURN 9FT ADLT (ELECTROSURGICAL)
ELECTRODE REM PT RTRN 9FT ADLT (ELECTROSURGICAL) IMPLANT
FCP BXJMBJMB 240X2.8X (CUTTING FORCEPS)
FLOOR PAD 36X40 (MISCELLANEOUS) ×2
FORCEP RJ3 GP 1.8X160 W-NEEDLE (CUTTING FORCEPS) IMPLANT
FORCEPS BIOP RAD 4 LRG CAP 4 (CUTTING FORCEPS) IMPLANT
FORCEPS BIOP RJ4 240 W/NDL (CUTTING FORCEPS)
FORCEPS BXJMBJMB 240X2.8X (CUTTING FORCEPS) IMPLANT
INJECTOR/SNARE I SNARE (MISCELLANEOUS) IMPLANT
LUBRICANT JELLY 4.5OZ STERILE (MISCELLANEOUS) IMPLANT
MANIFOLD NEPTUNE II (INSTRUMENTS) IMPLANT
NDL SCLEROTHERAPY 25GX240 (NEEDLE) IMPLANT
NEEDLE SCLEROTHERAPY 25GX240 (NEEDLE)
PAD FLOOR 36X40 (MISCELLANEOUS) ×2 IMPLANT
PROBE APC STR FIRE (PROBE) IMPLANT
PROBE INJECTION GOLD 7FR (MISCELLANEOUS) IMPLANT
SNARE ROTATE MED OVAL 20MM (MISCELLANEOUS) IMPLANT
SNARE SHORT THROW 13M SML OVAL (MISCELLANEOUS) IMPLANT
SYR 50ML LL SCALE MARK (SYRINGE) IMPLANT
TRAP SPECIMEN MUCOUS 40CC (MISCELLANEOUS) IMPLANT
TUBING ENDO SMARTCAP PENTAX (MISCELLANEOUS) ×4 IMPLANT
TUBING IRRIGATION ENDOGATOR (MISCELLANEOUS) ×2 IMPLANT
WATER STERILE IRR 1000ML POUR (IV SOLUTION) IMPLANT

## 2023-08-09 NOTE — Transfer of Care (Signed)
Immediate Anesthesia Transfer of Care Note  Patient: Anthony Delgado  Procedure(s) Performed: ESOPHAGOGASTRODUODENOSCOPY (EGD) WITH PROPOFOL COLONOSCOPY WITH PROPOFOL BIOPSY POLYPECTOMY  Patient Location: Endoscopy Unit  Anesthesia Type:MAC  Level of Consciousness: awake, drowsy, and patient cooperative  Airway & Oxygen Therapy: Patient Spontanous Breathing and Patient connected to face mask oxygen  Post-op Assessment: Report given to RN and Post -op Vital signs reviewed and stable  Post vital signs: Reviewed and stable  Last Vitals:  Vitals Value Taken Time  BP 96/79 0923  Temp    Pulse 102 08/09/23 0923  Resp 23 08/09/23 0923  SpO2 97 % 08/09/23 0923  Vitals shown include unfiled device data.  Last Pain:  Vitals:   08/09/23 0731  TempSrc: Temporal  PainSc: 0-No pain         Complications: No notable events documented.

## 2023-08-09 NOTE — Anesthesia Preprocedure Evaluation (Signed)
Anesthesia Evaluation  Patient identified by MRN, date of birth, ID band Patient awake    Reviewed: Allergy & Precautions, H&P , NPO status , Patient's Chart, lab work & pertinent test results  Airway Mallampati: IV  TM Distance: >3 FB Neck ROM: Full    Dental  (+) Teeth Intact, Dental Advisory Given   Pulmonary asthma (uses inhaler once a week)  Has never been tested for sleep apnea, snores at night, has had witnessed apneas    Pulmonary exam normal breath sounds clear to auscultation       Cardiovascular hypertension (101/84), Pt. on medications and Pt. on home beta blockers Normal cardiovascular exam+ dysrhythmias (eliquis LD 12/11) Atrial Fibrillation  Rhythm:Regular Rate:Normal  Echo 07/23/23 1. Left ventricular ejection fraction, by estimation, is 60 to 65%. The  left ventricle has normal function. The left ventricle has no regional  wall motion abnormalities. Left ventricular diastolic parameters were  normal.   2. Right ventricular systolic function is normal. The right ventricular  size is normal.   3. The mitral valve is normal in structure. No evidence of mitral valve  regurgitation. No evidence of mitral stenosis.   4. The aortic valve is tricuspid. There is mild calcification of the  aortic valve. Aortic valve regurgitation is not visualized. Aortic valve  sclerosis is present, with no evidence of aortic valve stenosis.   5. The inferior vena cava is normal in size with greater than 50%  respiratory variability, suggesting right atrial pressure of 3 mmHg.     Neuro/Psych negative neurological ROS  negative psych ROS   GI/Hepatic negative GI ROS, Neg liver ROS,,,  Endo/Other    Class 4 obesityBMI 51  Renal/GU Renal InsufficiencyRenal diseaseCr 1.33  negative genitourinary   Musculoskeletal negative musculoskeletal ROS (+)    Abdominal  (+) + obese  Peds negative pediatric ROS (+)  Hematology negative  hematology ROS (+)   Anesthesia Other Findings   Reproductive/Obstetrics negative OB ROS                              Anesthesia Physical Anesthesia Plan  ASA: 4  Anesthesia Plan: MAC   Post-op Pain Management:    Induction:   PONV Risk Score and Plan: 2 and Propofol infusion and TIVA  Airway Management Planned: Natural Airway and Simple Face Mask  Additional Equipment: None  Intra-op Plan:   Post-operative Plan:   Informed Consent: I have reviewed the patients History and Physical, chart, labs and discussed the procedure including the risks, benefits and alternatives for the proposed anesthesia with the patient or authorized representative who has indicated his/her understanding and acceptance.       Plan Discussed with: CRNA  Anesthesia Plan Comments:          Anesthesia Quick Evaluation

## 2023-08-09 NOTE — H&P (Signed)
GASTROENTEROLOGY PROCEDURE H&P NOTE   Primary Care Physician: [name], FNP    Reason for Procedure:   Rectal bleeding, possible melena  Plan:    EGD/colonoscopy  Patient is appropriate for endoscopic procedure(s) in the hospital setting.  The nature of the procedure, as well as the risks, benefits, and alternatives were carefully and thoroughly reviewed with the patient. Ample time for discussion and questions allowed. The patient understood, was satisfied, and agreed to proceed.     HPI: Anthony Delgado is a 47 y.o. male who presents for EGD/colonoscopy for rectal bleeding and possible melena.   Past Medical History:  Diagnosis Date   A-fib (HCC)    Abnormal LFTs 07/06/2012   Cellulitis and abscess of trunk 07/06/2012   Congestive heart failure (CHF) (HCC)    Diarrhea 07/06/2012   Encephalopathy acute 07/06/2012   Hypertension    Hyponatremia 07/06/2012   Nausea & vomiting 07/06/2012   Staph aureus infection 07/06/2012   TIA (transient ischemic attack)    TIA (transient ischemic attack) 04/02/2013    Past Surgical History:  Procedure Laterality Date   CARDIOVERSION N/A 06/01/2022   Procedure: CARDIOVERSION;  Surgeon: Jake Bathe, MD;  Location: [facility] ENDOSCOPY;  Service: Cardiovascular;  Laterality: N/A;   CARDIOVERSION N/A 06/22/2022   Procedure: CARDIOVERSION;  Surgeon: Sande Rives, MD;  Location: [facility] ENDOSCOPY;  Service: Cardiovascular;  Laterality: N/A;   CARDIOVERSION N/A 07/05/2022   Procedure: CARDIOVERSION;  Surgeon: Thomasene Ripple, DO;  Location: [facility] ENDOSCOPY;  Service: Cardiovascular;  Laterality: N/A;   CHOLECYSTECTOMY  2009   TEE WITHOUT CARDIOVERSION N/A 06/01/2022   Procedure: TRANSESOPHAGEAL ECHOCARDIOGRAM (TEE);  Surgeon: Jake Bathe, MD;  Location: [facility] ENDOSCOPY;  Service: Cardiovascular;  Laterality: N/A;    Prior to Admission medications   Medication Sig Start Date End Date Taking? Authorizing Provider  benzonatate (TESSALON  PERLES) 100 MG capsule Take 1 capsule (100 mg total) by mouth every 6 (six) hours as needed. 08/07/23 08/06/24 Yes [name], FNP  carvedilol (COREG) 12.5 MG tablet Take 1 tablet (12.5 mg total) by mouth 2 (two) times daily. 06/21/23  Yes [name], FNP  dapagliflozin propanediol (FARXIGA) 10 MG TABS tablet Take 1 tablet (10 mg total) by mouth daily. 04/02/23  Yes [name], FNP  dofetilide (TIKOSYN) 500 MCG capsule Take 1 capsule (500 mcg total) by mouth 2 (two) times daily. 11/22/22  Yes Camnitz, Andree Coss, MD  furosemide (LASIX) 40 MG tablet Take 1 tablet (40 mg total) by mouth 2 (two) times daily. 06/21/23  Yes [name], FNP  hydrocortisone (ANUSOL-HC) 25 MG suppository Place 1 suppository (25 mg total) rectally 2 (two) times daily for 7 days. 06/25/23  Yes Pricilla Loveless, MD  levalbuterol [facility] HFA) 45 MCG/ACT inhaler Inhale 2 puffs into the lungs every 6 (six) hours as needed for wheezing. 08/07/23  Yes [name], FNP  spironolactone (ALDACTONE) 25 MG tablet Take 1/2 tablet (12.5 mg total) by mouth daily. 06/21/23  Yes [name], FNP  trimethoprim-polymyxin b (POLYTRIM) ophthalmic solution Place 1 drop into both eyes every 4 (four) hours. 07/19/23  Yes Horton, Mayer Masker, MD  apixaban (ELIQUIS) 5 MG TABS tablet Take 1 tablet (5 mg total) by mouth 2 (two) times daily. 07/19/23   [name], FNP    No current facility-administered medications for this encounter.    Allergies as of 07/02/2023   (No Known Allergies)    Family History  Problem Relation Age of Onset   Hypertension Mother  Kidney failure Father    Hypertension Sister    Prostate cancer Maternal Uncle    Hypertension Maternal Uncle    Stroke Neg Hx        None at early ages   CAD Neg Hx        None at early ages    Social History   Socioeconomic History   Marital status: Married    Spouse name: Harriett Sine   Number of children: 4   Years of education: Not on file   Highest education  level: High school graduate  Occupational History   Occupation: Ecologist  Tobacco Use   Smoking status: Never   Smokeless tobacco: Never  Vaping Use   Vaping status: Never Used  Substance and Sexual Activity   Alcohol use: No   Drug use: No   Sexual activity: Yes    Partners: Female  Other Topics Concern   Not on file  Social History Narrative   Not on file   Social Drivers of Health   Financial Resource Strain: Low Risk  (06/01/2022)   Overall Financial Resource Strain (CARDIA)    Difficulty of Paying Living Expenses: Not hard at all  Food Insecurity: No Food Insecurity (06/20/2022)   Hunger Vital Sign    Worried About Running Out of Food in the Last Year: Never true    Ran Out of Food in the Last Year: Never true  Transportation Needs: No Transportation Needs (06/20/2022)   PRAPARE - Administrator, Civil Service (Medical): No    Lack of Transportation (Non-Medical): No  Physical Activity: Not on file  Stress: Not on file  Social Connections: Not on file  Intimate Partner Violence: Not At Risk (06/20/2022)   Humiliation, Afraid, Rape, and Kick questionnaire    Fear of Current or Ex-Partner: No    Emotionally Abused: No    Physically Abused: No    Sexually Abused: No    Physical Exam: Vital signs in last 24 hours: BP 101/84   Pulse 96   Temp (!) 97 F (36.1 C) (Temporal)   Resp 20   SpO2 97%  GEN: NAD EYE: Sclerae anicteric ENT: MMM CV: Non-tachycardic Pulm: No increased work of breathing GI: Soft, NT/ND NEURO:  Alert & Oriented   Eulah Pont, MD [facility]  08/09/2023 8:13 AM

## 2023-08-09 NOTE — Discharge Instructions (Signed)
YOU HAD AN ENDOSCOPIC PROCEDURE TODAY: Refer to the procedure report and other information in the discharge instructions given to you for any specific questions about what was found during the examination. If this information does not answer your questions, please call [place] office at [phone] to clarify.   YOU SHOULD EXPECT: Some feelings of bloating in the abdomen. Passage of more gas than usual. Walking can help get rid of the air that was put into your GI tract during the procedure and reduce the bloating. If you had a lower endoscopy (such as a colonoscopy or flexible sigmoidoscopy) you may notice spotting of blood in your stool or on the toilet paper. Some abdominal soreness may be present for a day or two, also.  DIET: Your first meal following the procedure should be a light meal and then it is ok to progress to your normal diet. A half-sandwich or bowl of soup is an example of a good first meal. Heavy or fried foods are harder to digest and may make you feel nauseous or bloated. Drink plenty of fluids but you should avoid alcoholic beverages for 24 hours.   ACTIVITY: Your care partner should take you home directly after the procedure. You should plan to take it easy, moving slowly for the rest of the day. You can resume normal activity the day after the procedure however YOU SHOULD NOT DRIVE, use power tools, machinery or perform tasks that involve climbing or major physical exertion for 24 hours (because of the sedation medicines used during the test).   SYMPTOMS TO REPORT IMMEDIATELY: A gastroenterologist can be reached at any hour. Please call [phone]  for any of the following symptoms:  Following lower endoscopy (colonoscopy, flexible sigmoidoscopy) Excessive amounts of blood in the stool  Significant tenderness, worsening of abdominal pains  Swelling of the abdomen that is new, acute  Fever of 100 or higher  Following upper endoscopy (EGD, EUS, ERCP, esophageal  dilation) Vomiting of blood or coffee ground material  New, significant abdominal pain  New, significant chest pain or pain under the shoulder blades  Painful or persistently difficult swallowing  New shortness of breath  Black, tarry-looking or red, bloody stools  FOLLOW UP:  If any biopsies were taken you will be contacted by phone or by letter within the next 1-3 weeks. Call [phone]  if you have not heard about the biopsies in 3 weeks.  Please also call with any specific questions about appointments or follow up tests.

## 2023-08-09 NOTE — Op Note (Addendum)
[facility] Patient Name: Anthony Delgado Procedure Date : 08/09/2023 MRN: [id number] Attending MD: Particia Lather , , [id number] Date of Birth: May 13, 1976 CSN: [id number] Age: 47 Admit Type: Outpatient Procedure:                Colonoscopy Indications:              Rectal bleeding Providers:                Madelyn Brunner" [name], RN, Melany Guernsey, Technician Referring MD:             [name] Medicines:                Monitored Anesthesia Care Complications:            No immediate complications. Estimated Blood Loss:     Estimated blood loss was minimal. Procedure:                Pre-Anesthesia Assessment:                           - Prior to the procedure, a History and Physical                            was performed, and patient medications and                            allergies were reviewed. The patient's tolerance of                            previous anesthesia was also reviewed. The risks                            and benefits of the procedure and the sedation                            options and risks were discussed with the patient.                            All questions were answered, and informed consent                            was obtained. Prior Anticoagulants: The patient has                            taken Eliquis (apixaban), last dose was 3 days                            prior to procedure. ASA Grade Assessment: III - A                            patient with severe systemic disease. After  reviewing the risks and benefits, the patient was                            deemed in satisfactory condition to undergo the                            procedure.                           After obtaining informed consent, the colonoscope                            was passed under direct vision. Throughout the                            procedure, the patient's blood  pressure, pulse, and                            oxygen saturations were monitored continuously. The                            [id number]-HQ190L ([id number] Olympus coloscope was                            introduced through the anus and advanced to the the                            cecum, identified by appendiceal orifice and                            ileocecal valve. The colonoscopy was performed                            without difficulty. The patient tolerated the                            procedure well. The quality of the bowel                            preparation was good. The ileocecal valve,                            appendiceal orifice, and rectum were photographed. Scope In: 9:01:31 AM Scope Out: 9:15:24 AM Scope Withdrawal Time: 0 hours 10 minutes 44 seconds  Total Procedure Duration: 0 hours 13 minutes 53 seconds  Findings:      A 2 mm polyp was found in the cecum. The polyp was sessile. The polyp       was removed with a cold biopsy forceps. Resection and retrieval were       complete.      A 4 mm polyp was found in the transverse colon. The polyp was sessile.       The polyp was removed with a cold snare. Resection and retrieval were       complete.      Non-bleeding internal hemorrhoids were found during retroflexion.  Impression:               - One 2 mm polyp in the cecum, removed with a cold                            biopsy forceps. Resected and retrieved.                           - One 4 mm polyp in the transverse colon, removed                            with a cold snare. Resected and retrieved.                           - Non-bleeding internal hemorrhoids. Recommendation:           - Discharge patient to home (with escort).                           - Await pathology results.                           - Anusol HC cream BID for 7 days.                           - If bleeding persists, can consider hemorrhoidal                            banding in the future.                            - Okay to restart your Eliquis tomorrow.                           - The findings and recommendations were discussed                            with the patient. Procedure Code(s):        --- Professional ---                           [phone], Colonoscopy, flexible; with removal of                            tumor(s), polyp(s), or other lesion(s) by snare                            technique                           45380, 59, Colonoscopy, flexible; with biopsy,                            single or multiple Diagnosis Code(s):        --- Professional ---  D12.0, Benign neoplasm of cecum                           D12.3, Benign neoplasm of transverse colon (hepatic                            flexure or splenic flexure)                           K64.8, Other hemorrhoids                           K62.5, Hemorrhage of anus and rectum CPT copyright 2022 American Medical Association. All rights reserved. The codes documented in this report are preliminary and upon coder review may  be revised to meet current compliance requirements. Dr Particia Lather "Alan Ripper" Leonides Schanz,  08/09/2023 9:37:29 AM Number of Addenda: 0

## 2023-08-09 NOTE — Op Note (Signed)
[facility] Patient Name: Anthony Delgado Procedure Date : 08/09/2023 MRN: [id number] Attending MD: Particia Lather , , [id number] Date of Birth: [birth year] CSN: [id number] Age: 47 Admit Type: Outpatient Procedure:                Upper GI endoscopy Indications:              Hematochezia, Melena Providers:                Madelyn Brunner" [name], RN, Melany Guernsey, Technician Referring MD:             [name] Medicines:                Monitored Anesthesia Care Complications:            No immediate complications. Estimated Blood Loss:     Estimated blood loss was minimal. Procedure:                Pre-Anesthesia Assessment:                           - Prior to the procedure, a History and Physical                            was performed, and patient medications and                            allergies were reviewed. The patient's tolerance of                            previous anesthesia was also reviewed. The risks                            and benefits of the procedure and the sedation                            options and risks were discussed with the patient.                            All questions were answered, and informed consent                            was obtained. Prior Anticoagulants: The patient has                            taken Eliquis (apixaban), last dose was 3 days                            prior to procedure. ASA Grade Assessment: III - A                            patient with severe systemic disease. After  reviewing the risks and benefits, the patient was                            deemed in satisfactory condition to undergo the                            procedure.                           After obtaining informed consent, the endoscope was                            passed under direct vision. Throughout the                            procedure, the patient's  blood pressure, pulse, and                            oxygen saturations were monitored continuously. The                            [id number]-[id number] ([id number]) Olympus endoscope was introduced                            through the mouth, and advanced to the second part                            of duodenum. Scope In: Scope Out: Findings:      The examined esophagus was normal.      Localized inflammation characterized by congestion (edema), erosions and       erythema was found in the gastric antrum. Biopsies were taken with a       cold forceps for histology.      Lymphangiectasia was present in the second portion of the duodenum.       Biopsies were taken with a cold forceps for histology. Impression:               - Normal esophagus.                           - Gastritis. Biopsied.                           - Duodenal mucosal lymphangiectasia. Recommendation:           - Await pathology results.                           - Use Protonix (pantoprazole) 40 mg PO daily for 8                            weeks.                           - Perform a colonoscopy today. Procedure Code(s):        --- Professional ---  57846, Esophagogastroduodenoscopy, flexible,                            transoral; with biopsy, single or multiple Diagnosis Code(s):        --- Professional ---                           K29.70, Gastritis, unspecified, without bleeding                           I89.0, Lymphedema, not elsewhere classified                           K92.1, Melena (includes Hematochezia) CPT copyright 2022 American Medical Association. All rights reserved. The codes documented in this report are preliminary and upon coder review may  be revised to meet current compliance requirements. Dr Particia Lather "Alan Ripper" Leonides Schanz,  08/09/2023 8:57:40 AM Number of Addenda: 0

## 2023-08-09 NOTE — Anesthesia Postprocedure Evaluation (Signed)
Anesthesia Post Note  Patient: Anthony Delgado  Procedure(s) Performed: ESOPHAGOGASTRODUODENOSCOPY (EGD) WITH PROPOFOL COLONOSCOPY WITH PROPOFOL BIOPSY POLYPECTOMY     Patient location during evaluation: PACU Anesthesia Type: MAC Level of consciousness: awake and alert Pain management: pain level controlled Vital Signs Assessment: post-procedure vital signs reviewed and stable Respiratory status: spontaneous breathing, nonlabored ventilation and respiratory function stable Cardiovascular status: blood pressure returned to baseline and stable Postop Assessment: no apparent nausea or vomiting Anesthetic complications: no   No notable events documented.  Last Vitals:  Vitals:   08/09/23 0923 08/09/23 0930  BP: 96/79 (!) 113/93  Pulse: (!) 125 82  Resp: (!) 22 19  Temp: 36.5 C   SpO2: 97% 97%    Last Pain:  Vitals:   08/09/23 0930  TempSrc:   PainSc: Asleep                 Lannie Fields

## 2023-08-10 LAB — SURGICAL PATHOLOGY

## 2023-08-13 ENCOUNTER — Encounter: Payer: Self-pay | Admitting: Internal Medicine

## 2023-08-14 ENCOUNTER — Other Ambulatory Visit (HOSPITAL_COMMUNITY): Payer: Self-pay

## 2023-08-14 ENCOUNTER — Encounter (HOSPITAL_COMMUNITY): Payer: Self-pay | Admitting: Internal Medicine

## 2023-08-23 ENCOUNTER — Other Ambulatory Visit (HOSPITAL_COMMUNITY): Payer: Self-pay | Admitting: Cardiology

## 2023-08-23 ENCOUNTER — Other Ambulatory Visit: Payer: Self-pay

## 2023-08-23 ENCOUNTER — Other Ambulatory Visit (HOSPITAL_COMMUNITY): Payer: Self-pay

## 2023-08-23 ENCOUNTER — Other Ambulatory Visit: Payer: Self-pay | Admitting: Nurse Practitioner

## 2023-08-23 MED ORDER — DOFETILIDE 500 MCG PO CAPS
500.0000 ug | ORAL_CAPSULE | Freq: Two times a day (BID) | ORAL | 1 refills | Status: DC
  Filled 2023-08-23: qty 60, 30d supply, fill #0
  Filled 2023-08-24 (×2): qty 180, 90d supply, fill #0
  Filled 2023-08-27: qty 60, 30d supply, fill #0
  Filled 2023-09-12: qty 60, 30d supply, fill #1
  Filled 2023-10-29: qty 60, 30d supply, fill #2
  Filled 2023-11-26: qty 60, 30d supply, fill #3
  Filled 2023-12-30: qty 60, 30d supply, fill #4
  Filled 2024-01-25: qty 60, 30d supply, fill #5

## 2023-08-24 ENCOUNTER — Other Ambulatory Visit (HOSPITAL_COMMUNITY): Payer: Self-pay

## 2023-08-24 ENCOUNTER — Encounter (HOSPITAL_COMMUNITY): Payer: Self-pay

## 2023-08-24 MED ORDER — APIXABAN 5 MG PO TABS
5.0000 mg | ORAL_TABLET | Freq: Two times a day (BID) | ORAL | 0 refills | Status: DC
  Filled 2023-08-24: qty 60, 30d supply, fill #0

## 2023-08-24 MED ORDER — FUROSEMIDE 40 MG PO TABS
40.0000 mg | ORAL_TABLET | Freq: Two times a day (BID) | ORAL | 1 refills | Status: DC
  Filled 2023-08-24: qty 60, 30d supply, fill #0
  Filled 2023-09-21: qty 60, 30d supply, fill #1

## 2023-08-24 MED ORDER — SPIRONOLACTONE 25 MG PO TABS
12.5000 mg | ORAL_TABLET | Freq: Every day | ORAL | 1 refills | Status: DC
  Filled 2023-08-24: qty 15, 30d supply, fill #0
  Filled 2023-09-21: qty 15, 30d supply, fill #1

## 2023-08-27 ENCOUNTER — Other Ambulatory Visit (HOSPITAL_COMMUNITY): Payer: Self-pay

## 2023-09-11 ENCOUNTER — Telehealth: Payer: Self-pay | Admitting: Pharmacy Technician

## 2023-09-11 ENCOUNTER — Other Ambulatory Visit (HOSPITAL_COMMUNITY): Payer: Self-pay

## 2023-09-11 NOTE — Telephone Encounter (Signed)
 Pharmacy Patient Advocate Encounter   Received notification from CoverMyMeds that prior authorization for Dofetilide  capsules is required/requested.   Insurance verification completed.   The patient is insured through [facility] .   Per test claim: PA required; PA submitted to above mentioned insurance via CoverMyMeds Key/confirmation #/EOC [id number] Status is pending

## 2023-09-12 ENCOUNTER — Other Ambulatory Visit (HOSPITAL_COMMUNITY): Payer: Self-pay

## 2023-09-12 NOTE — Telephone Encounter (Signed)
 Pharmacy Patient Advocate Encounter  Received notification from [facility] that Prior Authorization for Dofetilide  capsules has been APPROVED from 09/11/23 to 09/09/24. Spoke to pharmacy to process.Copay is $125.00.    PA #/Case ID/Reference #: [id number]

## 2023-09-21 ENCOUNTER — Other Ambulatory Visit: Payer: Self-pay | Admitting: Nurse Practitioner

## 2023-09-24 ENCOUNTER — Ambulatory Visit (INDEPENDENT_AMBULATORY_CARE_PROVIDER_SITE_OTHER): Payer: Commercial Managed Care - PPO

## 2023-09-24 ENCOUNTER — Ambulatory Visit
Admission: EM | Admit: 2023-09-24 | Discharge: 2023-09-24 | Disposition: A | Discharge status: Home / Self Care | Payer: Commercial Managed Care - PPO | Attending: Physician Assistant | Admitting: Physician Assistant

## 2023-09-24 ENCOUNTER — Other Ambulatory Visit (HOSPITAL_COMMUNITY): Payer: Self-pay

## 2023-09-24 DIAGNOSIS — R051 Acute cough: Secondary | ICD-10-CM | POA: Diagnosis not present

## 2023-09-24 DIAGNOSIS — J4541 Moderate persistent asthma with (acute) exacerbation: Secondary | ICD-10-CM | POA: Diagnosis not present

## 2023-09-24 DIAGNOSIS — J40 Bronchitis, not specified as acute or chronic: Secondary | ICD-10-CM

## 2023-09-24 DIAGNOSIS — J329 Chronic sinusitis, unspecified: Secondary | ICD-10-CM

## 2023-09-24 MED ORDER — CARVEDILOL 12.5 MG PO TABS
12.5000 mg | ORAL_TABLET | Freq: Two times a day (BID) | ORAL | 0 refills | Status: DC
  Filled 2023-09-24: qty 60, 30d supply, fill #0
  Filled 2023-10-29: qty 60, 30d supply, fill #1
  Filled 2023-11-26: qty 60, 30d supply, fill #2

## 2023-09-24 MED ORDER — DOXYCYCLINE HYCLATE 100 MG PO CAPS
100.0000 mg | ORAL_CAPSULE | Freq: Two times a day (BID) | ORAL | 0 refills | Status: DC
  Filled 2023-09-24: qty 20, 10d supply, fill #0

## 2023-09-24 MED ORDER — APIXABAN 5 MG PO TABS
5.0000 mg | ORAL_TABLET | Freq: Two times a day (BID) | ORAL | 0 refills | Status: DC
  Filled 2023-09-24: qty 60, 30d supply, fill #0

## 2023-09-24 MED ORDER — IPRATROPIUM-ALBUTEROL 0.5-2.5 (3) MG/3ML IN SOLN
3.0000 mL | Freq: Once | RESPIRATORY_TRACT | Status: AC
  Administered 2023-09-24: 3 mL via RESPIRATORY_TRACT

## 2023-09-24 MED ORDER — METHYLPREDNISOLONE ACETATE 80 MG/ML IJ SUSP
60.0000 mg | Freq: Once | INTRAMUSCULAR | Status: AC
Start: 2023-09-24 — End: 2023-09-24
  Administered 2023-09-24: 60 mg via INTRAMUSCULAR

## 2023-09-24 MED ORDER — PREDNISONE 20 MG PO TABS
40.0000 mg | ORAL_TABLET | Freq: Every day | ORAL | 0 refills | Status: AC
  Filled 2023-09-24: qty 10, 5d supply, fill #0

## 2023-09-24 MED ORDER — BUDESONIDE-FORMOTEROL FUMARATE 160-4.5 MCG/ACT IN AERO
2.0000 | INHALATION_SPRAY | Freq: Two times a day (BID) | RESPIRATORY_TRACT | 0 refills | Status: DC
  Filled 2023-09-24: qty 10.2, 30d supply, fill #0

## 2023-09-24 NOTE — ED Triage Notes (Signed)
Patient reports cough and wheezing x 1 week. He has been using his albuterol with no relief.

## 2023-09-24 NOTE — Discharge Instructions (Signed)
I do not see any evidence of pneumonia on your exam.  I will contact you if we need to arrange additional treatment based on the radiologist read.  I suspect you have an asthma exacerbation that has been triggered by an illness.  Start prednisone 40 mg for 5 days tomorrow (09/25/2023).  Do not take NSAIDs with this medication including aspirin, ibuprofen/Advil, naproxen/Aleve.  Start doxycycline 100 mg twice daily for 10 days to cover for infection.  Stay out of the sun while on this medication.  Start Symbicort twice daily.  Rinse your mouth following use of this medication to prevent thrush.  If your symptoms are not improving within 3 to 5 days or if anything worsens you need to be seen immediately.

## 2023-09-24 NOTE — ED Provider Notes (Signed)
[facility]    CSN: [id number] Arrival date & time: 09/24/23  0903      History   Chief Complaint Chief Complaint  Patient presents with   Wheezing   Cough    HPI Anthony Delgado is a 48 y.o. male.   Patient presents today with a weeklong history of coughing and wheezing.  He reports that several months ago he was diagnosed with walking pneumonia and was prescribed antibiotic, albuterol inhaler, benzonatate.  His symptoms improved but never completely resolved and have worsened in the past week.  He has been taking his albuterol inhaler regularly with last dose earlier today with only temporary improvement of symptoms.  He denies any recent steroids.  He denies formal diagnosis of asthma, COPD, smoking, allergies.  He works in the cold and this triggers his symptoms to the point that he has coughing, shortness of breath, wheezing and cannot perform his work duties.  He does not smoke.  He missed work as a result of his symptoms.  Denies any congestion, fever, nausea, vomiting.  Denies history of diabetes    Past Medical History:  Diagnosis Date   A-fib (HCC)    Abnormal LFTs 07/06/2012   Cellulitis and abscess of trunk 07/06/2012   Congestive heart failure (CHF) (HCC)    Diarrhea 07/06/2012   Encephalopathy acute 07/06/2012   Hypertension    Hyponatremia 07/06/2012   Nausea & vomiting 07/06/2012   Staph aureus infection 07/06/2012   TIA (transient ischemic attack)    TIA (transient ischemic attack) 04/02/2013    Patient Active Problem List   Diagnosis Date Noted   Melena 08/09/2023   Rectal bleeding 08/07/2023   Pneumonia of both lower lobes due to infectious organism 08/07/2023   Need for influenza vaccination 08/07/2023   COVID-19 vaccine administered 08/07/2023   Malignant essential hypertension with congestive heart failure (HCC) 04/04/2023   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 (HCC)  04/04/2023   Mixed hyperlipidemia 04/04/2023   Prediabetes 04/04/2023   Colon cancer screening 04/04/2023   HFrEF (heart failure with reduced ejection fraction) (HCC) 10/30/2022   Elevated LDL cholesterol level 10/30/2022   Atrial fibrillation with RVR (HCC) 05/29/2022   Sensory disturbance 04/03/2013   Renal insufficiency 04/03/2013   Other and unspecified hyperlipidemia 04/03/2013   Chest pain at rest 04/03/2013    Past Surgical History:  Procedure Laterality Date   BIOPSY  08/09/2023   Procedure: BIOPSY;  Surgeon: Imogene Burn, MD;  Location: [facility] ENDOSCOPY;  Service: Gastroenterology;;   CARDIOVERSION N/A 06/01/2022   Procedure: CARDIOVERSION;  Surgeon: Jake Bathe, MD;  Location: [facility] ENDOSCOPY;  Service: Cardiovascular;  Laterality: N/A;   CARDIOVERSION N/A 06/22/2022   Procedure: CARDIOVERSION;  Surgeon: Sande Rives, MD;  Location: [facility] ENDOSCOPY;  Service: Cardiovascular;  Laterality: N/A;   CARDIOVERSION N/A 07/05/2022   Procedure: CARDIOVERSION;  Surgeon: Thomasene Ripple, DO;  Location: [facility] ENDOSCOPY;  Service: Cardiovascular;  Laterality: N/A;   CHOLECYSTECTOMY  2009   COLONOSCOPY WITH PROPOFOL N/A 08/09/2023   Procedure: COLONOSCOPY WITH PROPOFOL;  Surgeon: Imogene Burn, MD;  Location: [facility] ENDOSCOPY;  Service: Gastroenterology;  Laterality: N/A;   ESOPHAGOGASTRODUODENOSCOPY (EGD) WITH PROPOFOL N/A 08/09/2023   Procedure: ESOPHAGOGASTRODUODENOSCOPY (EGD) WITH PROPOFOL;  Surgeon: Imogene Burn, MD;  Location: [facility] ENDOSCOPY;  Service: Gastroenterology;  Laterality: N/A;   POLYPECTOMY  08/09/2023   Procedure: POLYPECTOMY;  Surgeon: Imogene Burn, MD;  Location: [facility] ENDOSCOPY;  Service: Gastroenterology;;   TEE WITHOUT CARDIOVERSION N/A 06/01/2022   Procedure: TRANSESOPHAGEAL ECHOCARDIOGRAM (TEE);  Surgeon: Jake Bathe, MD;  Location: [facility] ENDOSCOPY;  Service: Cardiovascular;  Laterality: N/A;       Home Medications    Prior to Admission medications   Medication Sig  Start Date End Date Taking? Authorizing Provider  apixaban (ELIQUIS) 5 MG TABS tablet Take 1 tablet (5 mg total) by mouth 2 (two) times daily. 08/24/23  Yes [name], FNP  benzonatate (TESSALON PERLES) 100 MG capsule Take 1 capsule (100 mg total) by mouth every 6 (six) hours as needed. 08/07/23 08/06/24 Yes [name], FNP  budesonide-formoterol [facility]) 160-4.5 MCG/ACT inhaler Inhale 2 puffs into the lungs in the morning and at bedtime. 09/24/23  Yes Zanae Kuehnle K, PA-C  carvedilol (COREG) 12.5 MG tablet Take 1 tablet (12.5 mg total) by mouth 2 (two) times daily. 06/21/23  Yes [name], FNP  dapagliflozin propanediol (FARXIGA) 10 MG TABS tablet Take 1 tablet (10 mg total) by mouth daily. 04/02/23  Yes [name], FNP  dofetilide (TIKOSYN) 500 MCG capsule Take 1 capsule (500 mcg total) by mouth 2 (two) times daily. 08/23/23  Yes Camnitz, Andree Coss, MD  furosemide (LASIX) 40 MG tablet Take 1 tablet (40 mg total) by mouth 2 (two) times daily. 08/24/23  Yes [name], FNP  levalbuterol [facility] HFA) 45 MCG/ACT inhaler Inhale 2 puffs into the lungs every 6 (six) hours as needed for wheezing. 08/07/23  Yes [name], FNP  spironolactone (ALDACTONE) 25 MG tablet Take 1/2 tablet (12.5 mg total) by mouth daily. 08/24/23  Yes [name], FNP  doxycycline (VIBRAMYCIN) 100 MG capsule Take 1 capsule (100 mg total) by mouth 2 (two) times daily. 09/24/23  Yes Ned Kakar K, PA-C  pantoprazole (PROTONIX) 40 MG tablet Take 1 tablet (40 mg total) by mouth daily. 08/09/23 10/08/23  Imogene Burn, MD  predniSONE (DELTASONE) 20 MG tablet Take 2 tablets (40 mg total) by mouth daily for 5 days. 09/24/23 09/29/23 Yes Halston Fairclough, Noberto Retort, PA-C    Family History Family History  Problem Relation Age of Onset   Hypertension Mother    Kidney failure Father    Hypertension Sister    Prostate cancer Maternal Uncle    Hypertension Maternal Uncle    Stroke Neg Hx        None at early ages   CAD Neg Hx         None at early ages    Social History Social History   Tobacco Use   Smoking status: Never   Smokeless tobacco: Never  Vaping Use   Vaping status: Never Used  Substance Use Topics   Alcohol use: No   Drug use: No     Allergies   Patient has no known allergies.   Review of Systems Review of Systems  Constitutional:  Positive for activity change. Negative for appetite change, fatigue and fever.  HENT:  Negative for congestion, sinus pressure, sneezing and sore throat.   Respiratory:  Positive for cough, chest tightness, shortness of breath and wheezing.   Cardiovascular:  Negative for chest pain.  Gastrointestinal:  Negative for abdominal pain, diarrhea, nausea and vomiting.  Neurological:  Negative for dizziness, light-headedness and headaches.     Physical Exam Triage Vital Signs ED Triage Vitals [09/24/23 1131]  Encounter Vitals Group     BP (!) 135/96     Systolic BP Percentile      Diastolic BP Percentile  Pulse Rate 99     Resp 18     Temp 98.4 F (36.9 C)     Temp Source Oral     SpO2 94 %     Weight      Height      Head Circumference      Peak Flow      Pain Score      Pain Loc      Pain Education      Exclude from Growth Chart    No data found.  Updated Vital Signs BP (!) 135/96 (BP Location: Left Arm)   Pulse 83   Temp 98.4 F (36.9 C) (Oral)   Resp 18   SpO2 97%   Visual Acuity Right Eye Distance:   Left Eye Distance:   Bilateral Distance:    Right Eye Near:   Left Eye Near:    Bilateral Near:     Physical Exam Vitals reviewed.  Constitutional:      General: He is awake.     Appearance: Normal appearance. He is well-developed. He is not ill-appearing.     Comments: Very pleasant male appears stated age in no acute distress sitting comfortably in exam room  HENT:     Head: Normocephalic and atraumatic.     Right Ear: Tympanic membrane, ear canal and external ear normal. Tympanic membrane is not erythematous or  bulging.     Left Ear: Tympanic membrane, ear canal and external ear normal. Tympanic membrane is not erythematous or bulging.     Nose: Nose normal.     Mouth/Throat:     Pharynx: Uvula midline. No oropharyngeal exudate, posterior oropharyngeal erythema or uvula swelling.  Cardiovascular:     Rate and Rhythm: Normal rate and regular rhythm.     Heart sounds: Normal heart sounds, S1 normal and S2 normal. No murmur heard. Pulmonary:     Effort: Pulmonary effort is normal. No accessory muscle usage or respiratory distress.     Breath sounds: No stridor. Wheezing present. No rhonchi or rales.     Comments: Widespread wheezing throughout lung fields Neurological:     Mental Status: He is alert.  Psychiatric:        Behavior: Behavior is cooperative.      UC Treatments / Results  Labs (all labs ordered are listed, but only abnormal results are displayed) Labs Reviewed - No data to display  EKG   Radiology No results found.  Procedures Procedures (including critical care time)  Medications Ordered in UC Medications  ipratropium-albuterol (DUONEB) 0.5-2.5 (3) MG/3ML nebulizer solution 3 mL (3 mLs Nebulization Given 09/24/23 1222)  methylPREDNISolone acetate (DEPO-MEDROL) injection 60 mg (60 mg Intramuscular Given 09/24/23 1220)    Initial Impression / Assessment and Plan / UC Course  I have reviewed the triage vital signs and the nursing notes.  Pertinent labs & imaging results that were available during my care of the patient were reviewed by me and considered in my medical decision making (see chart for details).     Patient is well-appearing, afebrile, nontoxic, nontachycardic.  Viral testing was deferred as he has been symptomatic for over a week.  Chest x-ray was obtained that showed peribronchial thickening but no focal consolidation based on my primary read.  At the time of discharge we were waiting for radiologist over read and we will contact him if this differs and  changes our treatment plan.  Will cover for sinobronchitis with doxycycline 100 mg twice daily for 10  days this will also cover for mycoplasma pneumonia which has had an increased incident in the community.  He was given DuoNeb and 60 mg of Depo-Medrol in clinic with improvement of symptoms.  Will start prednisone burst of 40 mg for 5 days and discussed that he is not to take NSAIDs with this medication.  He will continue using levalbuterol for acute symptoms but will also start Symbicort.  Discussed that he is to rinse his mouth following use of this medication to prevent thrush.  He is to follow-up closely with his primary care.  If anything worsens or changes and he has chest pain, fever, shortness of breath, nausea/vomiting interfering with oral intake he needs to go to the emergency room.  Strict return precautions given.  Work excuse note provided.  Final Clinical Impressions(s) / UC Diagnoses   Final diagnoses:  Acute cough  Sinobronchitis  Moderate persistent asthma with acute exacerbation     Discharge Instructions      I do not see any evidence of pneumonia on your exam.  I will contact you if we need to arrange additional treatment based on the radiologist read.  I suspect you have an asthma exacerbation that has been triggered by an illness.  Start prednisone 40 mg for 5 days tomorrow (09/25/2023).  Do not take NSAIDs with this medication including aspirin, ibuprofen/Advil, naproxen/Aleve.  Start doxycycline 100 mg twice daily for 10 days to cover for infection.  Stay out of the sun while on this medication.  Start Symbicort twice daily.  Rinse your mouth following use of this medication to prevent thrush.  If your symptoms are not improving within 3 to 5 days or if anything worsens you need to be seen immediately.     ED Prescriptions     Medication Sig Dispense Auth. Provider   predniSONE (DELTASONE) 20 MG tablet Take 2 tablets (40 mg total) by mouth daily for 5 days. 10 tablet  Tristyn Demarest K, PA-C   doxycycline (VIBRAMYCIN) 100 MG capsule Take 1 capsule (100 mg total) by mouth 2 (two) times daily. 20 capsule Rahi Chandonnet K, PA-C   budesonide-formoterol (SYMBICORT) 160-4.5 MCG/ACT inhaler Inhale 2 puffs into the lungs in the morning and at bedtime. 1 each Jovahn Breit, Noberto Retort, PA-C      PDMP not reviewed this encounter.   Jeani Hawking, PA-C 09/24/23 1253

## 2023-09-25 ENCOUNTER — Encounter: Payer: Self-pay | Admitting: Nurse Practitioner

## 2023-10-29 ENCOUNTER — Other Ambulatory Visit (HOSPITAL_COMMUNITY): Payer: Self-pay | Admitting: Physician Assistant

## 2023-10-29 ENCOUNTER — Other Ambulatory Visit: Payer: Self-pay | Admitting: Nurse Practitioner

## 2023-10-29 ENCOUNTER — Other Ambulatory Visit (HOSPITAL_COMMUNITY): Payer: Self-pay

## 2023-10-29 ENCOUNTER — Other Ambulatory Visit: Payer: Self-pay

## 2023-10-29 MED ORDER — APIXABAN 5 MG PO TABS
5.0000 mg | ORAL_TABLET | Freq: Two times a day (BID) | ORAL | 0 refills | Status: DC
  Filled 2023-10-29: qty 60, 30d supply, fill #0

## 2023-10-29 MED ORDER — FUROSEMIDE 40 MG PO TABS
40.0000 mg | ORAL_TABLET | Freq: Two times a day (BID) | ORAL | 1 refills | Status: DC
  Filled 2023-10-29: qty 60, 30d supply, fill #0
  Filled 2023-11-26: qty 60, 30d supply, fill #1

## 2023-10-29 MED ORDER — SPIRONOLACTONE 25 MG PO TABS
12.5000 mg | ORAL_TABLET | Freq: Every day | ORAL | 1 refills | Status: DC
  Filled 2023-10-29: qty 15, 30d supply, fill #0
  Filled 2023-11-26: qty 15, 30d supply, fill #1

## 2023-10-30 ENCOUNTER — Other Ambulatory Visit: Payer: Self-pay

## 2023-10-30 DIAGNOSIS — R911 Solitary pulmonary nodule: Secondary | ICD-10-CM

## 2023-10-30 NOTE — Progress Notes (Unsigned)
 Madelaine Bhat, CMA,acting as a Neurosurgeon for [name], FNP.,have documented all relevant documentation on the behalf of [name], FNP,as directed by  [name], FNP while in the presence of [name], FNP.  Subjective:   Patient ID: Anthony Delgado , male    DOB: [birth year] , 48 y.o.   MRN: [id number]  No chief complaint on file.   HPI  Patient presents today for HM, Patient reports compliance with medication. Patient denies any chest pain, SOB, or headaches. Patient has no concerns today.     Past Medical History:  Diagnosis Date  . A-fib (HCC)   . Abnormal LFTs 07/06/2012  . Cellulitis and abscess of trunk 07/06/2012  . Congestive heart failure (CHF) (HCC)   . Diarrhea 07/06/2012  . Encephalopathy acute 07/06/2012  . Hypertension   . Hyponatremia 07/06/2012  . Nausea & vomiting 07/06/2012  . Staph aureus infection 07/06/2012  . TIA (transient ischemic attack)   . TIA (transient ischemic attack) 04/02/2013     Family History  Problem Relation Age of Onset  . Hypertension Mother   . Kidney failure Father   . Hypertension Sister   . Prostate cancer Maternal Uncle   . Hypertension Maternal Uncle   . Stroke Neg Hx        None at early ages  . CAD Neg Hx        None at early ages     Current Outpatient Medications:  .  apixaban (ELIQUIS) 5 MG TABS tablet, Take 1 tablet (5 mg total) by mouth 2 (two) times daily., Disp: 60 tablet, Rfl: 0 .  benzonatate (TESSALON PERLES) 100 MG capsule, Take 1 capsule (100 mg total) by mouth every 6 (six) hours as needed., Disp: 30 capsule, Rfl: 1 .  budesonide-formoterol (SYMBICORT) 160-4.5 MCG/ACT inhaler, Inhale 2 puffs into the lungs in the morning and at bedtime., Disp: 10.2 g, Rfl: 0 .  carvedilol (COREG) 12.5 MG tablet, Take 1 tablet (12.5 mg total) by mouth 2 (two) times daily., Disp: 180 tablet, Rfl: 0 .  dapagliflozin propanediol (FARXIGA) 10 MG TABS tablet, Take 1 tablet (10 mg total) by mouth daily., Disp: 90 tablet,  Rfl: 1 .  dofetilide (TIKOSYN) 500 MCG capsule, Take 1 capsule (500 mcg total) by mouth 2 (two) times daily., Disp: 180 capsule, Rfl: 1 .  doxycycline (VIBRAMYCIN) 100 MG capsule, Take 1 capsule (100 mg total) by mouth 2 (two) times daily., Disp: 20 capsule, Rfl: 0 .  furosemide (LASIX) 40 MG tablet, Take 1 tablet (40 mg total) by mouth 2 (two) times daily., Disp: 60 tablet, Rfl: 1 .  levalbuterol (XOPENEX HFA) 45 MCG/ACT inhaler, Inhale 2 puffs into the lungs every 6 (six) hours as needed for wheezing., Disp: 15 g, Rfl: 2 .  pantoprazole (PROTONIX) 40 MG tablet, Take 1 tablet (40 mg total) by mouth daily., Disp: 30 tablet, Rfl: 1 .  spironolactone (ALDACTONE) 25 MG tablet, Take 1/2 tablet (12.5 mg total) by mouth daily., Disp: 15 tablet, Rfl: 1   No Known Allergies   Men's preventive visit. Patient Health Questionnaire (PHQ-2) is  Flowsheet Row Office Visit from 10/30/2022 in [facility]  PHQ-2 Total Score 0     . Patient is on a *** diet. Marital status: Married. Relevant history for alcohol use is:  Social History   Substance and Sexual Activity  Alcohol Use No  . Relevant history for tobacco use is:  Social History   Tobacco Use  Smoking  Status Never  Smokeless Tobacco Never  .   Review of Systems   There were no vitals filed for this visit. There is no height or weight on file to calculate BMI.  Wt Readings from Last 3 Encounters:  08/07/23 (!) 356 lb (161.5 kg)  07/18/23 (!) 361 lb 1.8 oz (163.8 kg)  07/02/23 (!) 361 lb 2 oz (163.8 kg)    Objective:  Physical Exam      Assessment And Plan:    Encounter for annual health examination  Malignant essential hypertension with congestive heart failure (HCC)  Mixed hyperlipidemia  Prediabetes     No follow-ups on file. Patient was given opportunity to ask questions. Patient verbalized understanding of the plan and was able to repeat key elements of the plan. All questions were  answered to their satisfaction.   [name], FNP  I, [name], FNP, have reviewed all documentation for this visit. The documentation on 10/30/23 for the exam, diagnosis, procedures, and orders are all accurate and complete.

## 2023-10-31 ENCOUNTER — Other Ambulatory Visit (HOSPITAL_COMMUNITY): Payer: Self-pay

## 2023-10-31 ENCOUNTER — Encounter: Payer: Commercial Managed Care - PPO | Admitting: Nurse Practitioner

## 2023-10-31 DIAGNOSIS — I110 Hypertensive heart disease with heart failure: Secondary | ICD-10-CM

## 2023-10-31 DIAGNOSIS — Z0000 Encounter for general adult medical examination without abnormal findings: Secondary | ICD-10-CM

## 2023-10-31 DIAGNOSIS — R7303 Prediabetes: Secondary | ICD-10-CM

## 2023-10-31 DIAGNOSIS — E782 Mixed hyperlipidemia: Secondary | ICD-10-CM

## 2023-10-31 MED ORDER — BUDESONIDE-FORMOTEROL FUMARATE 160-4.5 MCG/ACT IN AERO
2.0000 | INHALATION_SPRAY | Freq: Two times a day (BID) | RESPIRATORY_TRACT | 0 refills | Status: DC
  Filled 2023-10-31 – 2023-11-26 (×2): qty 10.2, 30d supply, fill #0

## 2023-11-12 ENCOUNTER — Other Ambulatory Visit (HOSPITAL_COMMUNITY): Payer: Self-pay

## 2023-11-26 ENCOUNTER — Other Ambulatory Visit: Payer: Self-pay | Admitting: Nurse Practitioner

## 2023-11-27 ENCOUNTER — Other Ambulatory Visit (HOSPITAL_COMMUNITY): Payer: Self-pay

## 2023-11-27 ENCOUNTER — Other Ambulatory Visit: Payer: Self-pay

## 2023-11-27 MED ORDER — APIXABAN 5 MG PO TABS
5.0000 mg | ORAL_TABLET | Freq: Two times a day (BID) | ORAL | 0 refills | Status: DC
  Filled 2023-11-27: qty 60, 30d supply, fill #0

## 2023-12-12 ENCOUNTER — Encounter: Payer: Self-pay | Admitting: Family Medicine

## 2023-12-12 ENCOUNTER — Ambulatory Visit (INDEPENDENT_AMBULATORY_CARE_PROVIDER_SITE_OTHER): Admitting: Family Medicine

## 2023-12-12 VITALS — BP 125/76 | HR 85 | Temp 97.8°F | Resp 16 | Ht 71.0 in | Wt 373.0 lb

## 2023-12-12 DIAGNOSIS — I4891 Unspecified atrial fibrillation: Secondary | ICD-10-CM

## 2023-12-12 DIAGNOSIS — E66813 Obesity, class 3: Secondary | ICD-10-CM

## 2023-12-12 DIAGNOSIS — E782 Mixed hyperlipidemia: Secondary | ICD-10-CM

## 2023-12-12 DIAGNOSIS — I5020 Unspecified systolic (congestive) heart failure: Secondary | ICD-10-CM | POA: Diagnosis not present

## 2023-12-12 DIAGNOSIS — Z6841 Body Mass Index (BMI) 40.0 and over, adult: Secondary | ICD-10-CM

## 2023-12-12 DIAGNOSIS — Z7689 Persons encountering health services in other specified circumstances: Secondary | ICD-10-CM

## 2023-12-12 NOTE — Progress Notes (Signed)
 Patient is here to established care with provider. ~health hx address ~care gaps address

## 2023-12-12 NOTE — Progress Notes (Signed)
 New Patient Office Visit  Subjective    Patient ID: Anthony Delgado, male    DOB: 05-Oct-1975  Age: 48 y.o. MRN: [id number]  CC:  Chief Complaint  Patient presents with   Establish Care    HPI Anthony Delgado presents to establish care and for review of chronic med issues including HTN, CHF, and hypertension. Patient reports med compliance and denies acute complaints.    Outpatient Encounter Medications as of 12/12/2023  Medication Sig   apixaban  (ELIQUIS ) 5 MG TABS tablet Take 1 tablet (5 mg total) by mouth 2 (two) times daily.   benzonatate  (TESSALON  PERLES) 100 MG capsule Take 1 capsule (100 mg total) by mouth every 6 (six) hours as needed.   budesonide -formoterol  (SYMBICORT ) 160-4.5 MCG/ACT inhaler Inhale 2 puffs into the lungs in the morning and at bedtime.   carvedilol  (COREG ) 12.5 MG tablet Take 1 tablet (12.5 mg total) by mouth 2 (two) times daily.   dofetilide  (TIKOSYN ) 500 MCG capsule Take 1 capsule (500 mcg total) by mouth 2 (two) times daily.   doxycycline  (VIBRAMYCIN ) 100 MG capsule Take 1 capsule (100 mg total) by mouth 2 (two) times daily.   furosemide  (LASIX ) 40 MG tablet Take 1 tablet (40 mg total) by mouth 2 (two) times daily.   levalbuterol  (XOPENEX  HFA) 45 MCG/ACT inhaler Inhale 2 puffs into the lungs every 6 (six) hours as needed for wheezing.   spironolactone  (ALDACTONE ) 25 MG tablet Take 1/2 tablet (12.5 mg total) by mouth daily.   dapagliflozin  propanediol (FARXIGA ) 10 MG TABS tablet Take 1 tablet (10 mg total) by mouth daily. (Patient not taking: Reported on 12/12/2023)   pantoprazole  (PROTONIX ) 40 MG tablet Take 1 tablet (40 mg total) by mouth daily.   No facility-administered encounter medications on file as of 12/12/2023.    Past Medical History:  Diagnosis Date   A-fib (HCC)    Abnormal LFTs 07/06/2012   Cellulitis and abscess of trunk 07/06/2012   Congestive heart failure (CHF) (HCC)    Diarrhea 07/06/2012   Encephalopathy acute 07/06/2012    Hypertension    Hyponatremia 07/06/2012   Nausea & vomiting 07/06/2012   Staph aureus infection 07/06/2012   TIA (transient ischemic attack)    TIA (transient ischemic attack) 04/02/2013    Past Surgical History:  Procedure Laterality Date   BIOPSY  08/09/2023   Procedure: BIOPSY;  Surgeon: Daina Drum, MD;  Location: [facility] ENDOSCOPY;  Service: Gastroenterology;;   CARDIOVERSION N/A 06/01/2022   Procedure: CARDIOVERSION;  Surgeon: Hugh Madura, MD;  Location: [facility] ENDOSCOPY;  Service: Cardiovascular;  Laterality: N/A;   CARDIOVERSION N/A 06/22/2022   Procedure: CARDIOVERSION;  Surgeon: Harrold Lincoln, MD;  Location: [facility] ENDOSCOPY;  Service: Cardiovascular;  Laterality: N/A;   CARDIOVERSION N/A 07/05/2022   Procedure: CARDIOVERSION;  Surgeon: Jerryl Morin, DO;  Location: [facility] ENDOSCOPY;  Service: Cardiovascular;  Laterality: N/A;   CHOLECYSTECTOMY  2009   COLONOSCOPY WITH PROPOFOL  N/A 08/09/2023   Procedure: COLONOSCOPY WITH PROPOFOL ;  Surgeon: Daina Drum, MD;  Location: [facility] ENDOSCOPY;  Service: Gastroenterology;  Laterality: N/A;   ESOPHAGOGASTRODUODENOSCOPY (EGD) WITH PROPOFOL  N/A 08/09/2023   Procedure: ESOPHAGOGASTRODUODENOSCOPY (EGD) WITH PROPOFOL ;  Surgeon: Daina Drum, MD;  Location: [facility] ENDOSCOPY;  Service: Gastroenterology;  Laterality: N/A;   POLYPECTOMY  08/09/2023   Procedure: POLYPECTOMY;  Surgeon: Daina Drum, MD;  Location: [facility] ENDOSCOPY;  Service: Gastroenterology;;   TEE WITHOUT CARDIOVERSION N/A 06/01/2022   Procedure: TRANSESOPHAGEAL ECHOCARDIOGRAM (TEE);  Surgeon: Hugh Madura, MD;  Location:  [facility] ENDOSCOPY;  Service: Cardiovascular;  Laterality: N/A;    Family History  Problem Relation Age of Onset   Hypertension Mother    Kidney failure Father    Hypertension Sister    Prostate cancer Maternal Uncle    Hypertension Maternal Uncle    Stroke Neg Hx        None at early ages   CAD Neg Hx        None at early ages    Social History   Socioeconomic  History   Marital status: Married    Spouse name: Haskell Linker   Number of children: 4   Years of education: Not on file   Highest education level: High school graduate  Occupational History   Occupation: Ecologist  Tobacco Use   Smoking status: Never   Smokeless tobacco: Never  Vaping Use   Vaping status: Never Used  Substance and Sexual Activity   Alcohol use: No   Drug use: No   Sexual activity: Yes    Partners: Female  Other Topics Concern   Not on file  Social History Narrative   Not on file   Social Drivers of Health   Financial Resource Strain: Low Risk  (06/01/2022)   Overall Financial Resource Strain (CARDIA)    Difficulty of Paying Living Expenses: Not hard at all  Food Insecurity: No Food Insecurity (06/20/2022)   Hunger Vital Sign    Worried About Running Out of Food in the Last Year: Never true    Ran Out of Food in the Last Year: Never true  Transportation Needs: No Transportation Needs (06/20/2022)   PRAPARE - Administrator, Civil Service (Medical): No    Lack of Transportation (Non-Medical): No  Physical Activity: Not on file  Stress: Not on file  Social Connections: Not on file  Intimate Partner Violence: Not At Risk (06/20/2022)   Humiliation, Afraid, Rape, and Kick questionnaire    Fear of Current or Ex-Partner: No    Emotionally Abused: No    Physically Abused: No    Sexually Abused: No    Review of Systems  All other systems reviewed and are negative.       Objective   BP 125/76   Pulse 85   Temp 97.8 F (36.6 C) (Oral)   Resp 16   Ht 5\' 11"  (1.803 m)   Wt (!) 373 lb (169.2 kg)   SpO2 95%   BMI 52.02 kg/m   Physical Exam Vitals and nursing note reviewed.  Constitutional:      General: He is not in acute distress.    Appearance: He is obese.  Cardiovascular:     Rate and Rhythm: Normal rate and regular rhythm.  Pulmonary:     Effort: Pulmonary effort is normal.     Breath sounds: Normal breath sounds.  Abdominal:      Palpations: Abdomen is soft.     Tenderness: There is no abdominal tenderness.  Neurological:     General: No focal deficit present.     Mental Status: He is alert and oriented to person, place, and time.         Assessment & Plan:   HFrEF (heart failure with reduced ejection fraction) (HCC)  Atrial fibrillation with RVR (HCC)  Mixed hyperlipidemia  Class 3 severe obesity due to excess calories with serious comorbidity and body mass index (BMI) of 50.0 to 59.9 in adult [facility])  Encounter to establish care   Patient to follow up with  consultant as scheduled for management of chronic med issues.   Return in about 6 months (around 06/12/2024) for follow up.   Arlo Lama, MD

## 2023-12-20 ENCOUNTER — Telehealth: Payer: Self-pay | Admitting: Cardiology

## 2023-12-20 NOTE — Telephone Encounter (Signed)
 Returned call to patient.  He has swelling bilaterally in lower extremities during the day that resolves by morning but recently the left leg stays swollen up to the knee.  Said trouble putting on shoe right now.  He is taking all prescribed medications.    He has usual shortness of breath after walking long distances and this had not worsened.  Clothes are tight in abdomen.  He has no weights to report.  No recent labs.  Was seen by PCP 4/16 who recommended f/u in cardiology.  Adv pt we will work to schedule him w Dr. Lawana Pray and that he should call back or go the ER for evaluation if symptoms worsen.   Message to scheduling pool to arrange appointment.

## 2023-12-20 NOTE — Telephone Encounter (Signed)
  Per MyChart scheduling message:  Pt c/o swelling/edema: STAT if pt has developed SOB within 24 hours  If swelling, where is the swelling located?   How much weight have you gained and in what time span?   Have you gained 2 pounds in a day or 5 pounds in a week?   Do you have a log of your daily weights (if so, list)?   Are you currently taking a fluid pill?   Are you currently SOB?   Have you traveled recently in a car or plane for an extended period of time?   1- mainly in my left leg from my foot all the way to my knee    2- have gained weight don't know exactly how much    3- it's over 5 pounds in a week    4- no I have not recorded my weight gain in some time    5- yes I'm currently taking a fluid pill    6- I'm only short of breath if I have to walk any period of time longer than say ten yards of a football field    7- yes to [place], [place]

## 2023-12-30 ENCOUNTER — Other Ambulatory Visit: Payer: Self-pay | Admitting: Nurse Practitioner

## 2023-12-30 ENCOUNTER — Other Ambulatory Visit (HOSPITAL_COMMUNITY): Payer: Self-pay | Admitting: Physician Assistant

## 2023-12-30 ENCOUNTER — Other Ambulatory Visit (HOSPITAL_COMMUNITY): Payer: Self-pay

## 2023-12-30 MED ORDER — BUDESONIDE-FORMOTEROL FUMARATE 160-4.5 MCG/ACT IN AERO
2.0000 | INHALATION_SPRAY | Freq: Two times a day (BID) | RESPIRATORY_TRACT | 0 refills | Status: DC
  Filled 2023-12-30: qty 10.2, 30d supply, fill #0

## 2023-12-31 ENCOUNTER — Other Ambulatory Visit: Payer: Self-pay

## 2023-12-31 ENCOUNTER — Other Ambulatory Visit (HOSPITAL_COMMUNITY): Payer: Self-pay

## 2023-12-31 MED ORDER — FUROSEMIDE 40 MG PO TABS
40.0000 mg | ORAL_TABLET | Freq: Two times a day (BID) | ORAL | 1 refills | Status: DC
  Filled 2023-12-31: qty 60, 30d supply, fill #0
  Filled 2024-01-28 – 2024-02-08 (×2): qty 60, 30d supply, fill #1

## 2023-12-31 MED ORDER — SPIRONOLACTONE 25 MG PO TABS
12.5000 mg | ORAL_TABLET | Freq: Every day | ORAL | 1 refills | Status: DC
  Filled 2023-12-31: qty 15, 30d supply, fill #0
  Filled 2024-01-28 – 2024-02-08 (×2): qty 15, 30d supply, fill #1

## 2023-12-31 MED ORDER — CARVEDILOL 12.5 MG PO TABS
12.5000 mg | ORAL_TABLET | Freq: Two times a day (BID) | ORAL | 0 refills | Status: DC
  Filled 2023-12-31: qty 60, 30d supply, fill #0

## 2023-12-31 MED ORDER — APIXABAN 5 MG PO TABS
5.0000 mg | ORAL_TABLET | Freq: Two times a day (BID) | ORAL | 0 refills | Status: DC
  Filled 2023-12-31: qty 60, 30d supply, fill #0

## 2024-01-02 NOTE — H&P (View-Only) (Signed)
  Electrophysiology Office Note:   Date:  01/03/2024  ID:  Anthony Delgado, DOB 13-Feb-1976, MRN [id number]  Primary Cardiologist: Alexandria Angel, MD Primary Heart Failure: None Electrophysiologist: Makinna Andy Cortland Ding, MD      History of Present Illness:   Anthony Delgado is a 48 y.o. male with h/o atrial fibrillation, chronic systolic heart failure, morbid obesity seen today for routine electrophysiology followup.   Since last being seen in our clinic the patient reports increasing fatigue, weakness, shortness of breath.  He has weakness in his legs when he tries to exert himself towards the end of the day.  He has not also noted some increased lower extremity swelling.  This has been occurring over the last 2 to 3 months..  he denies chest pain, palpitations, PND, orthopnea, nausea, vomiting, dizziness, syncope, weight gain, or early satiety.   Review of systems complete and found to be negative unless listed in HPI.   EP Information / Studies Reviewed:    EKG is ordered today. Personal review as below.  EKG Interpretation Date/Time:  Thursday Jan 03 2024 14:11:12 EDT Ventricular Rate:  115 PR Interval:    QRS Duration:  70 QT Interval:  328 QTC Calculation: 453 R Axis:   58  Text Interpretation: Atrial fibrillation with rapid ventricular response with premature ventricular or aberrantly conducted complexes When compared with ECG of 18-Jul-2023 17:27, Atrial fibrillation has replaced Sinus rhythm Confirmed by Viraaj Vorndran ([id number]) on 01/03/2024 2:16:26 PM     Risk Assessment/Calculations:    CHA2DS2-VASc Score = 2   This indicates a 2.2% annual risk of stroke. The patient's score is based upon: CHF History: 1 HTN History: 1 Diabetes History: 0 Stroke History: 0 Vascular Disease History: 0 Age Score: 0 Gender Score: 0            Physical Exam:   VS:  BP (!) 142/72 (BP Location: Right Arm, Patient Position: Sitting, Cuff Size: Large)   Pulse (!) 115   Ht 5\' 11"  (1.803 m)    Wt (!) 360 lb (163.3 kg)   SpO2 94%   BMI 50.21 kg/m    Wt Readings from Last 3 Encounters:  01/03/24 (!) 360 lb (163.3 kg)  12/12/23 (!) 373 lb (169.2 kg)  08/07/23 (!) 356 lb (161.5 kg)     GEN: Well nourished, well developed in no acute distress NECK: No JVD; No carotid bruits CARDIAC: Irregularly irregular rate and rhythm, no murmurs, rubs, gallops RESPIRATORY:  Clear to auscultation without rales, wheezing or rhonchi  ABDOMEN: Soft, non-tender, non-distended EXTREMITIES: 1-2+ edema; No deformity   ASSESSMENT AND PLAN:    1.  Persistent atrial fibrillation: On dofetilide .  He is in atrial fibrillation today.  He feels tired, fatigued, short of breath with lower extremity edema.  As he is feeling poorly, we Yahshua Thibault plan for cardioversion.  His BMI is significantly elevated.  If he loses weight, he would be a candidate for ablation.  2.  Chronic systolic heart failure: On medical therapy per primary cardiology.  Ejection fraction is normalized with maintenance of sinus rhythm.  Continue with plan for rhythm control  3.  Morbid obesity: Lifestyle modification encouraged  4.  Secondary to coagula state: On Eliquis  for atrial fibrillation  6.  High-risk medication monitoring: On dofetilide .  QTc remained stable.  Follow up with Afib Clinic as usual post procedure  Signed, Niesha Bame Cortland Ding, MD

## 2024-01-02 NOTE — Progress Notes (Unsigned)
  Electrophysiology Office Note:   Date:  01/03/2024  ID:  [name], DOB 13-Feb-1976, MRN [id number]  Primary Cardiologist: Alexandria Angel, MD Primary Heart Failure: None Electrophysiologist: Makinna Andy Cortland Ding, MD      History of Present Illness:   Anthony Delgado is a 48 y.o. male with h/o atrial fibrillation, chronic systolic heart failure, morbid obesity seen today for routine electrophysiology followup.   Since last being seen in our clinic the patient reports increasing fatigue, weakness, shortness of breath.  He has weakness in his legs when he tries to exert himself towards the end of the day.  He has not also noted some increased lower extremity swelling.  This has been occurring over the last 2 to 3 months..  he denies chest pain, palpitations, PND, orthopnea, nausea, vomiting, dizziness, syncope, weight gain, or early satiety.   Review of systems complete and found to be negative unless listed in HPI.   EP Information / Studies Reviewed:    EKG is ordered today. Personal review as below.  EKG Interpretation Date/Time:  Thursday Jan 03 2024 14:11:12 EDT Ventricular Rate:  115 PR Interval:    QRS Duration:  70 QT Interval:  328 QTC Calculation: 453 R Axis:   58  Text Interpretation: Atrial fibrillation with rapid ventricular response with premature ventricular or aberrantly conducted complexes When compared with ECG of 18-Jul-2023 17:27, Atrial fibrillation has replaced Sinus rhythm Confirmed by Viraaj Vorndran ([id number]) on 01/03/2024 2:16:26 PM     Risk Assessment/Calculations:    CHA2DS2-VASc Score = 2   This indicates a 2.2% annual risk of stroke. The patient's score is based upon: CHF History: 1 HTN History: 1 Diabetes History: 0 Stroke History: 0 Vascular Disease History: 0 Age Score: 0 Gender Score: 0            Physical Exam:   VS:  BP (!) 142/72 (BP Location: Right Arm, Patient Position: Sitting, Cuff Size: Large)   Pulse (!) 115   Ht 5\' 11"  (1.803 m)    Wt (!) 360 lb (163.3 kg)   SpO2 94%   BMI 50.21 kg/m    Wt Readings from Last 3 Encounters:  01/03/24 (!) 360 lb (163.3 kg)  12/12/23 (!) 373 lb (169.2 kg)  08/07/23 (!) 356 lb (161.5 kg)     GEN: Well nourished, well developed in no acute distress NECK: No JVD; No carotid bruits CARDIAC: Irregularly irregular rate and rhythm, no murmurs, rubs, gallops RESPIRATORY:  Clear to auscultation without rales, wheezing or rhonchi  ABDOMEN: Soft, non-tender, non-distended EXTREMITIES: 1-2+ edema; No deformity   ASSESSMENT AND PLAN:    1.  Persistent atrial fibrillation: On dofetilide .  He is in atrial fibrillation today.  He feels tired, fatigued, short of breath with lower extremity edema.  As he is feeling poorly, we Yahshua Thibault plan for cardioversion.  His BMI is significantly elevated.  If he loses weight, he would be a candidate for ablation.  2.  Chronic systolic heart failure: On medical therapy per primary cardiology.  Ejection fraction is normalized with maintenance of sinus rhythm.  Continue with plan for rhythm control  3.  Morbid obesity: Lifestyle modification encouraged  4.  Secondary to coagula state: On Eliquis  for atrial fibrillation  6.  High-risk medication monitoring: On dofetilide .  QTc remained stable.  Follow up with Afib Clinic as usual post procedure  Signed, Niesha Bame Cortland Ding, MD

## 2024-01-03 ENCOUNTER — Encounter: Payer: Self-pay | Admitting: Cardiology

## 2024-01-03 ENCOUNTER — Ambulatory Visit: Attending: Cardiology | Admitting: Cardiology

## 2024-01-03 VITALS — BP 142/72 | HR 115 | Ht 71.0 in | Wt 360.0 lb

## 2024-01-03 DIAGNOSIS — D6869 Other thrombophilia: Secondary | ICD-10-CM

## 2024-01-03 DIAGNOSIS — I5022 Chronic systolic (congestive) heart failure: Secondary | ICD-10-CM

## 2024-01-03 DIAGNOSIS — Z01812 Encounter for preprocedural laboratory examination: Secondary | ICD-10-CM | POA: Diagnosis not present

## 2024-01-03 DIAGNOSIS — I4819 Other persistent atrial fibrillation: Secondary | ICD-10-CM

## 2024-01-03 DIAGNOSIS — Z79899 Other long term (current) drug therapy: Secondary | ICD-10-CM

## 2024-01-03 NOTE — Patient Instructions (Addendum)
 Medication Instructions:  Your physician recommends that you continue on your current medications as directed. Please refer to the Current Medication list given to you today.  *If you need a refill on your cardiac medications before your next appointment, please call your pharmacy*  Lab Work: Today: BMET & CBC   If you have any lab test that is abnormal or we need to change your treatment, we will call you to review the results.  Testing/Procedures: Your physician has recommended that you have a Cardioversion (DCCV). Electrical Cardioversion uses a jolt of electricity to your heart either through paddles or wired patches attached to your chest. This is a controlled, usually prescheduled, procedure. Defibrillation is done under light anesthesia in the hospital, and you usually go home the day of the procedure. This is done to get your heart back into a normal rhythm. You are not awake for the procedure. Please see the instruction sheet given to you today.  Follow-Up: At [facility], you and your health needs are our priority.  As part of our continuing mission to provide you with exceptional heart care, our providers are all part of one team.  This team includes your primary Cardiologist (physician) and Advanced Practice Providers or APPs (Physician Assistants and Nurse Practitioners) who all work together to provide you with the care you need, when you need it.  Your next appointment:   1 month(s) after your cardioversion  Provider:   You will follow up in the Atrial Fibrillation Clinic located at [facility]. Your provider will be: Clint R. Fenton, PA-C or Minnie Amber, PA-C     Thank you for choosing [employer]!!   Reece Cane, RN [phone]   Other Instructions     Dear Anthony Delgado  You are scheduled for a Cardioversion on Tuesday, May 13 with Dr. Alvis Ba.  Please arrive at the [facility] (Main Entrance A) at [facility]: [address] [place], [place] [address] at 1:30 PM (This time is 1 hour(s) before your procedure to ensure your preparation).   Free valet parking service is available. You will check in at ADMITTING.   *Please Note: You will receive a call the day before your procedure to confirm the appointment time. That time may have changed from the original time based on the schedule for that day.*   DIET:  Nothing to eat or drink after midnight except a sip of water with medications (see medication instructions below)  MEDICATION INSTRUCTIONS: !!IF ANY NEW MEDICATIONS ARE STARTED AFTER TODAY, PLEASE NOTIFY YOUR PROVIDER AS SOON AS POSSIBLE!!  FYI: Medications such as Semaglutide (Ozempic, [place]), Tirzepatide (Mounjaro, Zepbound), Dulaglutide (Trulicity), etc ("GLP1 agonists") AND Canagliflozin (Invokana), Dapagliflozin  (Farxiga ), Empagliflozin (Jardiance), Ertugliflozin (Steglatro), Bexagliflozin [employer]) or any combination with one of these drugs such as Invokamet (Canagliflozin/Metformin), Synjardy (Empagliflozin/Metformin), etc ("SGLT2 inhibitors") must be held around the time of a procedure. This is not a comprehensive list of all of these drugs. Please review all of your medications and talk to your provider if you take any one of these. If you are not sure, ask your provider.         :1}Continue taking your anticoagulant (blood thinner): Apixaban  (Eliquis ).  You will need to continue this after your procedure until you are told by your provider that it is safe to stop.    LABS: CBC, BMET  FYI:  For your safety, and to allow us  to monitor your vital signs accurately during the surgery/procedure we request:  If you have artificial nails, gel coating, SNS etc, please have those removed prior to your surgery/procedure. Not having the nail coverings /polish removed may result in cancellation or delay of your surgery/procedure.  Your support person will be asked to wait in the waiting room during your procedure.   It is OK to have someone drop you off and come back when you are ready to be discharged.  You cannot drive after the procedure and will need someone to drive you home.  Bring your insurance cards.  *Special Note: Every effort is made to have your procedure done on time. Occasionally there are emergencies that occur at the hospital that may cause delays. Please be patient if a delay does occur.

## 2024-01-04 LAB — BASIC METABOLIC PANEL WITH GFR
BUN/Creatinine Ratio: 8 — ABNORMAL LOW (ref 9–20)
BUN: 9 mg/dL (ref 6–24)
CO2: 19 mmol/L — ABNORMAL LOW (ref 20–29)
Calcium: 8.9 mg/dL (ref 8.7–10.2)
Chloride: 104 mmol/L (ref 96–106)
Creatinine, Ser: 1.15 mg/dL (ref 0.76–1.27)
Glucose: 111 mg/dL — ABNORMAL HIGH (ref 70–99)
Potassium: 4.2 mmol/L (ref 3.5–5.2)
Sodium: 140 mmol/L (ref 134–144)
eGFR: 78 mL/min/{1.73_m2} (ref >59)

## 2024-01-04 LAB — CBC
Hematocrit: 48.2 % (ref 37.5–51.0)
Hemoglobin: 16.5 g/dL (ref 13.0–17.7)
MCH: 31.3 pg (ref 26.6–33.0)
MCHC: 34.2 g/dL (ref 31.5–35.7)
MCV: 91 fL (ref 79–97)
Platelets: 243 10*3/uL (ref 150–450)
RBC: 5.28 x10E6/uL (ref 4.14–5.80)
RDW: 13.7 % (ref 11.6–15.4)
WBC: 5.1 10*3/uL (ref 3.4–10.8)

## 2024-01-07 NOTE — Progress Notes (Signed)
 Unable to reach patient for pre-procedure instructions. Pt's voicemail not set up, so generic message left by this RN.

## 2024-01-08 ENCOUNTER — Ambulatory Visit (HOSPITAL_COMMUNITY): Admitting: Anesthesiology

## 2024-01-08 ENCOUNTER — Encounter (HOSPITAL_COMMUNITY)
Admission: RE | Disposition: A | Discharge status: Home / Self Care | Payer: Self-pay | Source: Home / Self Care | Attending: Cardiovascular Disease

## 2024-01-08 ENCOUNTER — Encounter (HOSPITAL_COMMUNITY): Payer: Self-pay | Admitting: Cardiovascular Disease

## 2024-01-08 ENCOUNTER — Other Ambulatory Visit: Payer: Self-pay

## 2024-01-08 ENCOUNTER — Ambulatory Visit (HOSPITAL_COMMUNITY)
Admission: RE | Admit: 2024-01-08 | Discharge: 2024-01-08 | Disposition: A | Discharge status: Home / Self Care | Attending: Cardiovascular Disease | Admitting: Cardiovascular Disease

## 2024-01-08 DIAGNOSIS — I5022 Chronic systolic (congestive) heart failure: Secondary | ICD-10-CM | POA: Diagnosis not present

## 2024-01-08 DIAGNOSIS — I4819 Other persistent atrial fibrillation: Secondary | ICD-10-CM | POA: Diagnosis not present

## 2024-01-08 DIAGNOSIS — E782 Mixed hyperlipidemia: Secondary | ICD-10-CM

## 2024-01-08 DIAGNOSIS — D6869 Other thrombophilia: Secondary | ICD-10-CM | POA: Insufficient documentation

## 2024-01-08 DIAGNOSIS — I4891 Unspecified atrial fibrillation: Secondary | ICD-10-CM | POA: Diagnosis present

## 2024-01-08 DIAGNOSIS — I110 Hypertensive heart disease with heart failure: Secondary | ICD-10-CM | POA: Diagnosis not present

## 2024-01-08 DIAGNOSIS — Z79899 Other long term (current) drug therapy: Secondary | ICD-10-CM | POA: Insufficient documentation

## 2024-01-08 DIAGNOSIS — Z7901 Long term (current) use of anticoagulants: Secondary | ICD-10-CM | POA: Insufficient documentation

## 2024-01-08 DIAGNOSIS — Z6841 Body Mass Index (BMI) 40.0 and over, adult: Secondary | ICD-10-CM | POA: Insufficient documentation

## 2024-01-08 HISTORY — PX: CARDIOVERSION: EP1203

## 2024-01-08 SURGERY — CARDIOVERSION (CATH LAB)
Anesthesia: General

## 2024-01-08 MED ORDER — LIDOCAINE 2% (20 MG/ML) 5 ML SYRINGE
INTRAMUSCULAR | Status: DC | PRN
  Administered 2024-01-08: 100 mg via INTRAVENOUS

## 2024-01-08 MED ORDER — SODIUM CHLORIDE 0.9% FLUSH: 3.0000 mL | INTRAVENOUS | Status: DC | PRN

## 2024-01-08 MED ORDER — CARVEDILOL 25 MG PO TABS: 25.0000 mg | ORAL_TABLET | Freq: Two times a day (BID) | ORAL | 3 refills | Status: AC

## 2024-01-08 MED ORDER — SODIUM CHLORIDE 0.9% FLUSH: 3.0000 mL | Freq: Two times a day (BID) | INTRAVENOUS | Status: DC

## 2024-01-08 MED ORDER — PROPOFOL 10 MG/ML IV BOLUS
INTRAVENOUS | Status: DC | PRN
  Administered 2024-01-08: 100 mg via INTRAVENOUS

## 2024-01-08 SURGICAL SUPPLY — 1 items: PAD DEFIB RADIO PHYSIO CONN (PAD) ×1 IMPLANT

## 2024-01-08 NOTE — Op Note (Signed)
 Procedure: Electrical Cardioversion Indications:  Atrial Fibrillation  Procedure Details:  Consent: Risks of procedure as well as the alternatives and risks of each were explained to the (patient/caregiver).  Consent for procedure obtained.  Time Out: Verified patient identification, verified procedure, site/side was marked, verified correct patient position, special equipment/implants available, medications/allergies/relevent history reviewed, required imaging and test results available.  Performed  Patient placed on cardiac monitor, pulse oximetry, supplemental oxygen as necessary.  Sedation given: propofol  100 mg IV, Dr. Wendy Hamel Pacer pads placed anterior and posterior chest.  Cardioverted 1 time(s).  Cardioversion with synchronized biphasic 200J shock.  Converted to NSR after 1 shock. Over a period of 2-3 minutes, had multiple episodes of rapid, irregular narrow complex tachycardia (pulmonary vein tachycardia?), eventually settled back into atrial fibrillation   Evaluation: Findings: Post procedure EKG shows: Atrial Fibrillation Complications: None Patient did tolerate procedure well.  Time Spent Directly with the Patient:  30 minutes   Anthony Delgado 01/08/2024, 1:45 PM

## 2024-01-08 NOTE — Anesthesia Postprocedure Evaluation (Signed)
 Anesthesia Post Note  Patient: Governor Osment  Procedure(s) Performed: CARDIOVERSION     Patient location during evaluation: PACU Anesthesia Type: General Level of consciousness: awake and alert, oriented and patient cooperative Pain management: pain level controlled Vital Signs Assessment: post-procedure vital signs reviewed and stable Respiratory status: spontaneous breathing, nonlabored ventilation and respiratory function stable Cardiovascular status: blood pressure returned to baseline and stable Postop Assessment: no apparent nausea or vomiting Anesthetic complications: no   No notable events documented.  Last Vitals:  Vitals:   01/08/24 1400 01/08/24 1415  BP: (!) 148/95 (!) 154/61  Pulse: 99 (!) 105  Resp: 16 15  Temp:    SpO2: 93% 93%    Last Pain:  Vitals:   01/08/24 1415  TempSrc:   PainSc: 0-No pain                 Jacquelyne Matte

## 2024-01-08 NOTE — Interval H&P Note (Signed)
 History and Physical Interval Note:  01/08/2024 1:19 PM  Anthony Delgado  has presented today for surgery, with the diagnosis of AFIB.  The various methods of treatment have been discussed with the patient and family. After consideration of risks, benefits and other options for treatment, the patient has consented to  Procedure(s): CARDIOVERSION (N/A) as a surgical intervention.  The patient's history has been reviewed, patient examined, no change in status, stable for surgery.  I have reviewed the patient's chart and labs.  Questions were answered to the patient's satisfaction.     [name]

## 2024-01-08 NOTE — Transfer of Care (Signed)
 Immediate Anesthesia Transfer of Care Note  Patient: Anthony Delgado  Procedure(s) Performed: CARDIOVERSION  Patient Location: PACU  Anesthesia Type:General  Level of Consciousness: drowsy  Airway & Oxygen Therapy: Patient Spontanous Breathing and Patient connected to face mask oxygen  Post-op Assessment: Report given to RN and Post -op Vital signs reviewed and stable  Post vital signs: Reviewed and stable  Last Vitals:  Vitals Value Taken Time  BP    Temp    Pulse    Resp    SpO2      Last Pain:  Vitals:   01/08/24 1330  TempSrc:   PainSc: 0-No pain         Complications: No notable events documented.

## 2024-01-08 NOTE — Anesthesia Preprocedure Evaluation (Addendum)
 Anesthesia Evaluation  Patient identified by MRN, date of birth, ID band Patient awake    Reviewed: Allergy & Precautions, NPO status , Patient's Chart, lab work & pertinent test results, reviewed documented beta blocker date and time   Airway Mallampati: III  TM Distance: >3 FB Neck ROM: Full    Dental  (+) Teeth Intact, Dental Advisory Given   Pulmonary neg pulmonary ROS   Pulmonary exam normal breath sounds clear to auscultation       Cardiovascular hypertension (144/97 preop), Pt. on medications and Pt. on home beta blockers Normal cardiovascular exam+ dysrhythmias Atrial Fibrillation  Rhythm:Regular Rate:Normal     Neuro/Psych TIA negative psych ROS   GI/Hepatic Neg liver ROS,GERD  Medicated and Controlled,,  Endo/Other    Class 4 obesityBMI 50  Renal/GU negative Renal ROS  negative genitourinary   Musculoskeletal negative musculoskeletal ROS (+)    Abdominal  (+) + obese  Peds  Hematology negative hematology ROS (+)   Anesthesia Other Findings   Reproductive/Obstetrics negative OB ROS                             Anesthesia Physical Anesthesia Plan  ASA: 3  Anesthesia Plan: General   Post-op Pain Management:    Induction: Intravenous  PONV Risk Score and Plan: TIVA and Treatment may vary due to age or medical condition  Airway Management Planned: Natural Airway and Mask  Additional Equipment: None  Intra-op Plan:   Post-operative Plan:   Informed Consent: I have reviewed the patients History and Physical, chart, labs and discussed the procedure including the risks, benefits and alternatives for the proposed anesthesia with the patient or authorized representative who has indicated his/her understanding and acceptance.       Plan Discussed with: CRNA  Anesthesia Plan Comments:        Anesthesia Quick Evaluation

## 2024-01-23 ENCOUNTER — Emergency Department (HOSPITAL_COMMUNITY)
Admission: EM | Admit: 2024-01-23 | Discharge: 2024-01-24 | Disposition: A | Discharge status: Home / Self Care | Attending: Emergency Medicine | Admitting: Emergency Medicine

## 2024-01-23 ENCOUNTER — Other Ambulatory Visit: Payer: Self-pay

## 2024-01-23 ENCOUNTER — Emergency Department (HOSPITAL_COMMUNITY)

## 2024-01-23 DIAGNOSIS — I509 Heart failure, unspecified: Secondary | ICD-10-CM | POA: Diagnosis not present

## 2024-01-23 DIAGNOSIS — Z79899 Other long term (current) drug therapy: Secondary | ICD-10-CM | POA: Diagnosis not present

## 2024-01-23 DIAGNOSIS — I110 Hypertensive heart disease with heart failure: Secondary | ICD-10-CM | POA: Diagnosis not present

## 2024-01-23 DIAGNOSIS — Z7901 Long term (current) use of anticoagulants: Secondary | ICD-10-CM | POA: Insufficient documentation

## 2024-01-23 DIAGNOSIS — R0602 Shortness of breath: Secondary | ICD-10-CM | POA: Insufficient documentation

## 2024-01-23 LAB — BASIC METABOLIC PANEL WITH GFR
Anion gap: 8 (ref 5–15)
BUN: 9 mg/dL (ref 6–20)
CO2: 27 mmol/L (ref 22–32)
Calcium: 8.7 mg/dL — ABNORMAL LOW (ref 8.9–10.3)
Chloride: 103 mmol/L (ref 98–111)
Creatinine, Ser: 1.21 mg/dL (ref 0.61–1.24)
GFR, Estimated: >60 mL/min (ref >60)
Glucose, Bld: 111 mg/dL — ABNORMAL HIGH (ref 70–99)
Potassium: 4.3 mmol/L (ref 3.5–5.1)
Sodium: 138 mmol/L (ref 135–145)

## 2024-01-23 LAB — CBC
HCT: 47.7 % (ref 39.0–52.0)
Hemoglobin: 15.5 g/dL (ref 13.0–17.0)
MCH: 30.6 pg (ref 26.0–34.0)
MCHC: 32.5 g/dL (ref 30.0–36.0)
MCV: 94.1 fL (ref 80.0–100.0)
Platelets: 257 10*3/uL (ref 150–400)
RBC: 5.07 MIL/uL (ref 4.22–5.81)
RDW: 13.8 % (ref 11.5–15.5)
WBC: 5.9 10*3/uL (ref 4.0–10.5)
nRBC: 0 % (ref 0.0–0.2)

## 2024-01-23 LAB — BRAIN NATRIURETIC PEPTIDE: B Natriuretic Peptide: 112.5 pg/mL — ABNORMAL HIGH (ref 0.0–100.0)

## 2024-01-23 NOTE — ED Provider Notes (Signed)
 [place] EMERGENCY DEPARTMENT AT [facility] Provider Note   CSN: [id number] Arrival date & time: 01/23/24  2206     History  Chief Complaint  Patient presents with   Shortness of Breath   Leg Swelling    Anthony Delgado is a 48 y.o. male with medical history to include CHF, hypertension, A-fib on Eliquis , A-fib RVR, chest pain at rest.  Patient presents to ED for evaluation of shortness of breath, leg swelling and chest tightness.  States that this evening he woke up around 7:30 PM after napping with shortness of breath worse with lying flat, tightness in his chest, leg swelling.  Reports history of leg swelling but states that his leg swelling tonight is slightly worse than usual.  Reports compliance on Tikosyn , carvedilol , Lasix , Eliquis .  States that 2 weeks ago he had to be cardioverted at outpatient cardiology appointment.  Reports that his chest tightness does not radiate.  Denies any nausea, vomiting, abdominal pain.  Reports "a little bit of" lightheadedness and dizziness.  Reports also generalized weakness.   Shortness of Breath      Home Medications Prior to Admission medications   Medication Sig Start Date End Date Taking? Authorizing Provider  levalbuterol  (XOPENEX  HFA) 45 MCG/ACT inhaler Inhale 2 puffs into the lungs every 6 (six) hours as needed for wheezing. 01/24/24  Yes Adel Aden, PA-C  predniSONE  (DELTASONE ) 20 MG tablet Take 2 tablets (40 mg total) by mouth daily. 01/24/24  Yes Adel Aden, PA-C  apixaban  (ELIQUIS ) 5 MG TABS tablet Take 1 tablet (5 mg total) by mouth 2 (two) times daily. 12/31/23   Moore, Janece, FNP  benzonatate  (TESSALON  PERLES) 100 MG capsule Take 1 capsule (100 mg total) by mouth every 6 (six) hours as needed. 08/07/23 08/06/24  Moore, Janece, FNP  budesonide -formoterol  (SYMBICORT ) 160-4.5 MCG/ACT inhaler Inhale 2 puffs into the lungs in the morning and at bedtime. 12/30/23   Raspet, Erin K, PA-C  carvedilol  (COREG ) 25  MG tablet Take 1 tablet (25 mg total) by mouth 2 (two) times daily. 01/08/24   Croitoru, Mihai, MD  dapagliflozin  propanediol (FARXIGA ) 10 MG TABS tablet Take 1 tablet (10 mg total) by mouth daily. Patient not taking: Reported on 01/03/2024 04/02/23   Moore, Janece, FNP  dofetilide  (TIKOSYN ) 500 MCG capsule Take 1 capsule (500 mcg total) by mouth 2 (two) times daily. 08/23/23   Camnitz, Babetta Lesch, MD  doxycycline  (VIBRAMYCIN ) 100 MG capsule Take 1 capsule (100 mg total) by mouth 2 (two) times daily. 09/24/23   Raspet, Erin K, PA-C  furosemide  (LASIX ) 40 MG tablet Take 1 tablet (40 mg total) by mouth 2 (two) times daily. 12/31/23   Susanna Epley, FNP  pantoprazole  (PROTONIX ) 40 MG tablet Take 1 tablet (40 mg total) by mouth daily. 08/09/23 01/30/24  Daina Drum, MD  spironolactone  (ALDACTONE ) 25 MG tablet Take 1/2 tablet (12.5 mg total) by mouth daily. 12/31/23   Moore, Janece, FNP      Allergies    Patient has no known allergies.    Review of Systems   Review of Systems  Respiratory:  Positive for shortness of breath.     Physical Exam Updated Vital Signs BP 117/86   Pulse (!) 33   Temp 98.3 F (36.8 C)   Resp 20   Wt (!) 163 kg   SpO2 96%   BMI 50.12 kg/m  Physical Exam Vitals and nursing note reviewed.  Constitutional:      General: He is  not in acute distress.    Appearance: He is well-developed.  HENT:     Head: Normocephalic and atraumatic.  Eyes:     Conjunctiva/sclera: Conjunctivae normal.  Cardiovascular:     Rate and Rhythm: Normal rate and regular rhythm.     Heart sounds: No murmur heard. Pulmonary:     Effort: Pulmonary effort is normal. No respiratory distress.     Breath sounds: Wheezing present.  Abdominal:     Palpations: Abdomen is soft.     Tenderness: There is no abdominal tenderness.  Musculoskeletal:        General: No swelling.     Cervical back: Neck supple.     Right lower leg: Edema present.     Left lower leg: Edema present.  Skin:    General:  Skin is warm and dry.     Capillary Refill: Capillary refill takes less than 2 seconds.  Neurological:     Mental Status: He is alert.  Psychiatric:        Mood and Affect: Mood normal.     ED Results / Procedures / Treatments   Labs (all labs ordered are listed, but only abnormal results are displayed) Labs Reviewed  BASIC METABOLIC PANEL WITH GFR - Abnormal; Notable for the following components:      Result Value   Glucose, Bld 111 (*)    Calcium 8.7 (*)    All other components within normal limits  BRAIN NATRIURETIC PEPTIDE - Abnormal; Notable for the following components:   B Natriuretic Peptide 112.5 (*)    All other components within normal limits  CBC  TROPONIN I (HIGH SENSITIVITY)  TROPONIN I (HIGH SENSITIVITY)    EKG EKG Interpretation Date/Time:  Wednesday Jan 23 2024 23:17:55 EDT Ventricular Rate:  97 PR Interval:    QRS Duration:  93 QT Interval:  394 QTC Calculation: 501 R Axis:   65  Text Interpretation: Atrial fibrillation Ventricular premature complex Low voltage, precordial leads Prolonged QT interval Confirmed by Eldon Greenland ([id number]) on 01/23/2024 11:47:42 PM  Radiology DG Chest 2 View Result Date: 01/23/2024 CLINICAL DATA:  Shortness of breath and bilateral leg swelling. EXAM: CHEST - 2 VIEW COMPARISON:  September 24, 2023 FINDINGS: The heart size and mediastinal contours are within normal limits. Low lung volumes are noted. Very mild linear atelectasis is seen within left lung base. No acute infiltrate, pleural effusion or pneumothorax is identified. The visualized skeletal structures are unremarkable. IMPRESSION: Low lung volumes with very mild left basilar linear atelectasis. Electronically Signed   By: Virgle Grime M.D.   On: 01/23/2024 22:30    Procedures Procedures    Medications Ordered in ED Medications  levalbuterol  (XOPENEX ) nebulizer solution 0.63 mg (0.63 mg Nebulization Given 01/24/24 0025)  predniSONE  (DELTASONE ) tablet 60 mg (60  mg Oral Given 01/24/24 0115)  levalbuterol  (XOPENEX ) nebulizer solution 0.63 mg (0.63 mg Nebulization Given 01/24/24 0233)  ipratropium (ATROVENT) nebulizer solution 0.5 mg (0.5 mg Nebulization Given 01/24/24 0233)    ED Course/ Medical Decision Making/ A&P   Medical Decision Making Amount and/or Complexity of Data Reviewed Labs: ordered. Radiology: ordered.   48 year old male presents to ED for evaluation.  Please see HPI for further details.  On exam patient is afebrile and nontachycardic.  His lung sounds have wheezing throughout, not hypoxic on room air.  Abdomen soft and compressible.  Neurological examination at baseline.  Edema to bilateral lower extremities which he reports is slightly above his baseline.  Lab initiated in triage  include CBC, BMP, BNP, chest x-ray, EKG.  I have added on troponin x 2.  Patient CBC without leukocytosis or anemia.  Metabolic panel without electrolyte derangement, creatinine 1.21, anion gap 8.  BNP 112.5.  Troponin 9, delta 9.  Patient troponins are flat.  Chest x-ray shows mild bibasilar atelectasis.  EKG is nonischemic.  Patient was provided with Xopenex  breathing treatments as well as prednisone .  Chart reviewed and patient has received these in the past.  After initial Xopenex  administered, patient reports his wheezing has subsided and he feels as if his shortness of breath is improving.  Will administer additional Xopenex  with Atrovent  and reassess.  After second Xopenex  and Atrovent , patient reports shortness of breath has resolved.  Will send patient with Xopenex  inhaler and short course of steroids.  Will have him follow-up outpatient with his cardiologist.  Patient was ambulated prior to discharge and maintained oxygen saturation on room air.  He is agreeable to this plan.  He was given strict return precautions and he voiced understanding.  Stable to discharge home.   Final Clinical Impression(s) / ED Diagnoses Final diagnoses:  Shortness of  breath    Rx / DC Orders ED Discharge Orders          Ordered    levalbuterol  (XOPENEX  HFA) 45 MCG/ACT inhaler  Every 6 hours PRN        01/24/24 0319    predniSONE  (DELTASONE ) 20 MG tablet  Daily        01/24/24 0319              Adel Aden, PA-C 01/24/24 1610    Eldon Greenland, MD 01/24/24 [phone]

## 2024-01-23 NOTE — ED Triage Notes (Signed)
 SOB and bilateral leg swelling since this morning. Pt is on a fluid pill and has not missed any doses

## 2024-01-23 NOTE — ED Provider Notes (Incomplete)
 [place] EMERGENCY DEPARTMENT AT [facility] Provider Note   CSN: [id number] Arrival date & time: 01/23/24  2206     History  Chief Complaint  Patient presents with  . Shortness of Breath  . Leg Swelling    Anthony Delgado is a 48 y.o. male with medical history to include CHF, hypertension, A-fib on Eliquis , A-fib RVR, chest pain at rest.  Patient presents to ED for evaluation of shortness of breath, leg swelling and chest tightness.  States that this evening he woke up around 7:30 PM after napping with shortness of breath worse with lying flat, tightness in his chest, leg swelling.  Reports history of leg swelling but states that his leg swelling tonight is slightly worse than usual.  Reports compliance on Tikosyn , carvedilol , Lasix , Eliquis .  States that 2 weeks ago he had to be cardioverted at outpatient cardiology appointment.  Reports that his chest tightness does not radiate.  Denies any nausea, vomiting, abdominal pain.  Reports "a little bit of" lightheadedness and dizziness.  Reports also generalized weakness.   Shortness of Breath      Home Medications Prior to Admission medications   Medication Sig Start Date End Date Taking? Authorizing Provider  apixaban  (ELIQUIS ) 5 MG TABS tablet Take 1 tablet (5 mg total) by mouth 2 (two) times daily. 12/31/23   Moore, Janece, FNP  benzonatate  (TESSALON  PERLES) 100 MG capsule Take 1 capsule (100 mg total) by mouth every 6 (six) hours as needed. 08/07/23 08/06/24  Moore, Janece, FNP  budesonide -formoterol  (SYMBICORT ) 160-4.5 MCG/ACT inhaler Inhale 2 puffs into the lungs in the morning and at bedtime. 12/30/23   Raspet, Erin K, PA-C  carvedilol  (COREG ) 25 MG tablet Take 1 tablet (25 mg total) by mouth 2 (two) times daily. 01/08/24   Croitoru, Mihai, MD  dapagliflozin  propanediol (FARXIGA ) 10 MG TABS tablet Take 1 tablet (10 mg total) by mouth daily. Patient not taking: Reported on 01/03/2024 04/02/23   Susanna Epley, FNP  dofetilide   (TIKOSYN ) 500 MCG capsule Take 1 capsule (500 mcg total) by mouth 2 (two) times daily. 08/23/23   Camnitz, Babetta Lesch, MD  doxycycline  (VIBRAMYCIN ) 100 MG capsule Take 1 capsule (100 mg total) by mouth 2 (two) times daily. 09/24/23   Raspet, Erin K, PA-C  furosemide  (LASIX ) 40 MG tablet Take 1 tablet (40 mg total) by mouth 2 (two) times daily. 12/31/23   Susanna Epley, FNP  levalbuterol  (XOPENEX  HFA) 45 MCG/ACT inhaler Inhale 2 puffs into the lungs every 6 (six) hours as needed for wheezing. 08/07/23   Susanna Epley, FNP  pantoprazole  (PROTONIX ) 40 MG tablet Take 1 tablet (40 mg total) by mouth daily. 08/09/23 01/30/24  Daina Drum, MD  spironolactone  (ALDACTONE ) 25 MG tablet Take 1/2 tablet (12.5 mg total) by mouth daily. 12/31/23   Moore, Janece, FNP      Allergies    Patient has no known allergies.    Review of Systems   Review of Systems  Respiratory:  Positive for shortness of breath.     Physical Exam Updated Vital Signs BP (!) 130/106 (BP Location: Left Arm)   Pulse (!) 111   Temp 98.3 F (36.8 C)   Resp (!) 24   Wt (!) 163 kg   SpO2 93%   BMI 50.12 kg/m  Physical Exam  ED Results / Procedures / Treatments   Labs (all labs ordered are listed, but only abnormal results are displayed) Labs Reviewed  BASIC METABOLIC PANEL WITH GFR - Abnormal; Notable  for the following components:      Result Value   Glucose, Bld 111 (*)    Calcium 8.7 (*)    All other components within normal limits  CBC  BRAIN NATRIURETIC PEPTIDE  TROPONIN I (HIGH SENSITIVITY)    EKG None  Radiology DG Chest 2 View Result Date: 01/23/2024 CLINICAL DATA:  Shortness of breath and bilateral leg swelling. EXAM: CHEST - 2 VIEW COMPARISON:  September 24, 2023 FINDINGS: The heart size and mediastinal contours are within normal limits. Low lung volumes are noted. Very mild linear atelectasis is seen within left lung base. No acute infiltrate, pleural effusion or pneumothorax is identified. The visualized  skeletal structures are unremarkable. IMPRESSION: Low lung volumes with very mild left basilar linear atelectasis. Electronically Signed   By: Virgle Grime M.D.   On: 01/23/2024 22:30    Procedures Procedures  {Document cardiac monitor, telemetry assessment procedure when appropriate:1}  Medications Ordered in ED Medications - No data to display  ED Course/ Medical Decision Making/ A&P   {   Click here for ABCD2, HEART and other calculatorsREFRESH Note before signing :1}                              Medical Decision Making Amount and/or Complexity of Data Reviewed Labs: ordered. Radiology: ordered.   ***  {Document critical care time when appropriate:1} {Document review of labs and clinical decision tools ie heart score, Chads2Vasc2 etc:1}  {Document your independent review of radiology images, and any outside records:1} {Document your discussion with family members, caretakers, and with consultants:1} {Document social determinants of health affecting pt's care:1} {Document your decision making why or why not admission, treatments were needed:1} Final Clinical Impression(s) / ED Diagnoses Final diagnoses:  None    Rx / DC Orders ED Discharge Orders     None

## 2024-01-24 ENCOUNTER — Other Ambulatory Visit (HOSPITAL_COMMUNITY): Payer: Self-pay

## 2024-01-24 LAB — TROPONIN I (HIGH SENSITIVITY)
Troponin I (High Sensitivity): 9 ng/L (ref <18)
Troponin I (High Sensitivity): 9 ng/L (ref <18)

## 2024-01-24 MED ORDER — IPRATROPIUM BROMIDE 0.02 % IN SOLN
0.5000 mg | Freq: Once | RESPIRATORY_TRACT | Status: AC
  Administered 2024-01-24: 0.5 mg via RESPIRATORY_TRACT
  Filled 2024-01-24: qty 2.5

## 2024-01-24 MED ORDER — PREDNISONE 20 MG PO TABS
60.0000 mg | ORAL_TABLET | Freq: Once | ORAL | Status: AC
  Administered 2024-01-24: 60 mg via ORAL
  Filled 2024-01-24: qty 3

## 2024-01-24 MED ORDER — PREDNISONE 20 MG PO TABS
40.0000 mg | ORAL_TABLET | Freq: Every day | ORAL | 0 refills | Status: DC
  Filled 2024-01-24 (×2): qty 8, 4d supply, fill #0

## 2024-01-24 MED ORDER — LEVALBUTEROL HCL 0.63 MG/3ML IN NEBU
0.6300 mg | INHALATION_SOLUTION | Freq: Once | RESPIRATORY_TRACT | Status: AC
  Administered 2024-01-24: 0.63 mg via RESPIRATORY_TRACT
  Filled 2024-01-24: qty 3

## 2024-01-24 MED ORDER — LEVALBUTEROL HCL 0.63 MG/3ML IN NEBU
0.6300 mg | INHALATION_SOLUTION | RESPIRATORY_TRACT | Status: AC
  Administered 2024-01-24: 0.63 mg via RESPIRATORY_TRACT
  Filled 2024-01-24: qty 3

## 2024-01-24 MED ORDER — LEVALBUTEROL TARTRATE 45 MCG/ACT IN AERO
2.0000 | INHALATION_SPRAY | Freq: Four times a day (QID) | RESPIRATORY_TRACT | 0 refills | Status: AC | PRN
  Filled 2024-01-24 (×2): qty 15, 25d supply, fill #0

## 2024-01-24 NOTE — Discharge Instructions (Signed)
 It was a pleasure taking part in your care.  As discussed, your work appears reassuring.  I am sending you home with a Xopenex  inhaler which you can use every 6 hours as needed for shortness of breath.  Please also begin taking 40 mg of prednisone  once a day beginning on 5/30.  Please follow-up with your PCP as well as her cardiology team.  Please continue taking all medications as prescribed.  Return to the ED with any new or worsening symptoms.

## 2024-01-24 NOTE — ED Notes (Signed)
 Pt ambulated while connected to pulse oximetry at this time by this nurse. Pt did not show signs of hypoxia and stayed above 99% SpO2. Upon returning to bed, pt exhibited labored breathing and unsteady gait

## 2024-01-28 ENCOUNTER — Other Ambulatory Visit (HOSPITAL_COMMUNITY): Payer: Self-pay

## 2024-01-28 ENCOUNTER — Other Ambulatory Visit: Payer: Self-pay | Admitting: Nurse Practitioner

## 2024-01-28 ENCOUNTER — Other Ambulatory Visit: Payer: Self-pay

## 2024-01-28 MED ORDER — APIXABAN 5 MG PO TABS
5.0000 mg | ORAL_TABLET | Freq: Two times a day (BID) | ORAL | 0 refills | Status: DC
  Filled 2024-01-28 – 2024-02-08 (×2): qty 60, 30d supply, fill #0

## 2024-01-29 ENCOUNTER — Telehealth: Payer: Self-pay | Admitting: *Deleted

## 2024-01-29 NOTE — Telephone Encounter (Signed)
-----   Message from Will [facility] sent at 01/08/2024  3:47 PM EDT ----- Needs sleep study ----- Message ----- From: Luana Rumple, MD Sent: 01/08/2024   1:53 PM EDT To: Lei Pump, MD; #  Can we please ask Dr. Adell Age nurse to get him scheduled for a sleep study? preferably directly to a split night study if insurance allows.  Had a cardioversion today and only lasted in sinus rhythm for 2 minutes. I increased his carvedilol  dose since ventricular rate was 110s at rest.

## 2024-01-29 NOTE — Telephone Encounter (Signed)
 Left message

## 2024-02-07 ENCOUNTER — Other Ambulatory Visit (HOSPITAL_COMMUNITY): Payer: Self-pay

## 2024-02-08 ENCOUNTER — Encounter (HOSPITAL_COMMUNITY): Payer: Self-pay | Admitting: Physician Assistant

## 2024-02-08 ENCOUNTER — Other Ambulatory Visit (HOSPITAL_COMMUNITY): Payer: Self-pay

## 2024-02-08 ENCOUNTER — Encounter: Payer: Self-pay | Admitting: Family Medicine

## 2024-02-08 ENCOUNTER — Ambulatory Visit (HOSPITAL_COMMUNITY)
Admission: RE | Admit: 2024-02-08 | Discharge: 2024-02-08 | Disposition: A | Discharge status: Home / Self Care | Source: Ambulatory Visit | Attending: Physician Assistant | Admitting: Physician Assistant

## 2024-02-08 ENCOUNTER — Ambulatory Visit (HOSPITAL_COMMUNITY): Admitting: Physician Assistant

## 2024-02-08 ENCOUNTER — Ambulatory Visit (INDEPENDENT_AMBULATORY_CARE_PROVIDER_SITE_OTHER): Admitting: Family Medicine

## 2024-02-08 ENCOUNTER — Encounter (HOSPITAL_COMMUNITY): Payer: Self-pay

## 2024-02-08 VITALS — BP 116/83 | HR 100 | Ht 72.0 in | Wt 383.6 lb

## 2024-02-08 VITALS — BP 114/80 | HR 117 | Ht 72.0 in | Wt 383.2 lb

## 2024-02-08 DIAGNOSIS — I5020 Unspecified systolic (congestive) heart failure: Secondary | ICD-10-CM

## 2024-02-08 DIAGNOSIS — D6869 Other thrombophilia: Secondary | ICD-10-CM

## 2024-02-08 DIAGNOSIS — Z0289 Encounter for other administrative examinations: Secondary | ICD-10-CM | POA: Diagnosis not present

## 2024-02-08 DIAGNOSIS — I4819 Other persistent atrial fibrillation: Secondary | ICD-10-CM

## 2024-02-08 DIAGNOSIS — Z6841 Body Mass Index (BMI) 40.0 and over, adult: Secondary | ICD-10-CM

## 2024-02-08 DIAGNOSIS — E66813 Obesity, class 3: Secondary | ICD-10-CM

## 2024-02-08 MED ORDER — AMIODARONE HCL 200 MG PO TABS
ORAL_TABLET | ORAL | 3 refills | Status: DC
  Filled 2024-02-08: qty 60, 30d supply, fill #0

## 2024-02-08 NOTE — Patient Instructions (Signed)
 Stop Tikosyn    Start 6/16 PM start amiodarone 200mg  twice a day until 7/15 then reduce to 200mg  daily ( take with food )

## 2024-02-08 NOTE — Progress Notes (Signed)
 Primary Care Physician: Abraham Abo, MD Referring Physician: [facility] f/u  Cardiologist: Dr. 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 male with a h/o  morbid obesity, HTN, ran out of his medication about 3 months ago due to PCP leaving and closing his practice and patient having difficulty establishing care. Presented to [facility] ED with worsening bilateral lower extremity edema, dyspnea, chest tightness.  He was found to be in A-fib with RVR, significantly fluid overloaded and was admitted to the hospital.  He was placed on diltiazem  infusion, Eliquis  and cardiology was consulted. His weight is usually in the 330s, it was as high as 357 on admission.    He was found to have HFrEF exacerbation and new onset afib with RVR.  He had  TEE cardioversion, but remained in atrial fibrillation for just a few beats with each shock. TEE showed severely reduced EF at 30% with moderate to severe tricuspid regurgitation, rt atrium severely dilated, mild MR.  He was  improved after diuresis and rate control.  Plan was  for outpatient follow up with cardiology for further management.  He is being seen in afib clinic today. He remains in rate controlled afib. He reports feeling much better, not aware of afib at this point. . Weight is stable, no unusual shortness of breath. He noted symptoms for about one month prior to presenting to the ED. He has been told that he snores teribly with apnea noted. He was put on cpap in hospital. He does not smoke, sue drugs or drink alcohol. 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   He is being compliant with meds. No missed eliquis  5 mg bid, he has been on drug for around a week started 05/29/22 prior to TEE which was negative for thrombus.  F/u in afib clinic, 06/20/22. He is here for Tikosyn  admit. He remains in rate controlled afib. Normovolemic. No missed anticoagulation or benadryl  use. His needs  were reviewed by PharmD and no contraindicated drugs on board. Qtc  is acceptable for tikosyn  use.  F/u in afib clinic, 08/29/21,  one week after Tikosyn  load. He did return to SR with cardioversion but ERAF. The plan was if he still had afib on return to clinic, to cardiovert again. Despite being in afib, he feels great. No missed anticoagulation.   Follow up 02/08/24. Patient returns for follow up for atrial fibrillation. Patient is s/p DCCV 01/08/24 but reverted back to afib after ~2-3 minutes. He does not have tachypalpitations but does note increased lower extremity edema when in afib. His edema responds well to Lasix . No bleeding issues on anticoagulation.   Today, he  denies symptoms of palpitations, chest pain, shortness of breath, orthopnea, PND, dizziness, presyncope, syncope, snoring, daytime somnolence, bleeding, or neurologic sequela. The patient is tolerating medications without difficulties and is otherwise without complaint today.    Past Medical History:  Diagnosis Date   A-fib (HCC)    Abnormal LFTs 07/06/2012   Cellulitis and abscess of trunk 07/06/2012   Congestive heart failure (CHF) (HCC)    Diarrhea 07/06/2012   Encephalopathy acute 07/06/2012   Hypertension    Hyponatremia 07/06/2012   Nausea & vomiting 07/06/2012   Staph aureus infection 07/06/2012   TIA (transient ischemic attack)    TIA (transient ischemic attack) 04/02/2013    Current Outpatient Medications  Medication Sig Dispense Refill   [START ON  02/11/2024] amiodarone (PACERONE) 200 MG tablet Take 1 tablet (200 mg total) by mouth 2 (two) times daily for 30 days, THEN 1 tablet (200 mg total) daily. 60 tablet 3   apixaban  (ELIQUIS ) 5 MG TABS tablet Take 1 tablet (5 mg total) by mouth 2 (two) times daily. 60 tablet 0   benzonatate  (TESSALON  PERLES) 100 MG capsule Take 1 capsule (100 mg total) by mouth every 6 (six) hours as needed. 30 capsule 1   budesonide -formoterol  (SYMBICORT ) 160-4.5 MCG/ACT inhaler Inhale 2  puffs into the lungs in the morning and at bedtime. 10.2 g 0   carvedilol  (COREG ) 25 MG tablet Take 1 tablet (25 mg total) by mouth 2 (two) times daily. 180 tablet 3   doxycycline  (VIBRAMYCIN ) 100 MG capsule Take 1 capsule (100 mg total) by mouth 2 (two) times daily. 20 capsule 0   furosemide  (LASIX ) 40 MG tablet Take 1 tablet (40 mg total) by mouth 2 (two) times daily. 60 tablet 1   levalbuterol  (XOPENEX  HFA) 45 MCG/ACT inhaler Inhale 2 puffs into the lungs every 6 (six) hours as needed for wheezing. 45 g 0   spironolactone  (ALDACTONE ) 25 MG tablet Take 1/2 tablet (12.5 mg total) by mouth daily. 15 tablet 1   dapagliflozin  propanediol (FARXIGA ) 10 MG TABS tablet Take 1 tablet (10 mg total) by mouth daily. (Patient not taking: Reported on 02/08/2024) 90 tablet 1   pantoprazole  (PROTONIX ) 40 MG tablet Take 1 tablet (40 mg total) by mouth daily. (Patient not taking: Reported on 02/08/2024) 30 tablet 1   predniSONE  (DELTASONE ) 20 MG tablet Take 2 tablets (40 mg total) by mouth daily. 8 tablet 0   No current facility-administered medications for this encounter.    ROS- All systems are reviewed and negative except as per the HPI above  Physical Exam: Vitals:   02/08/24 0913  BP: 114/80  Pulse: (!) 117  Weight: (!) 173.8 kg  Height: 6' (1.829 m)    Wt Readings from Last 3 Encounters:  02/08/24 (!) 173.8 kg  02/08/24 (!) 174 kg  01/23/24 (!) 163 kg    GEN: Well nourished, well developed in no acute distress NECK: No JVD; No carotid bruits CARDIAC: Irregularly irregular rate and rhythm, no murmurs, rubs, gallops RESPIRATORY:  Clear to auscultation without rales, wheezing or rhonchi  ABDOMEN: Soft, non-tender, non-distended EXTREMITIES:  1+ bilateral lower extremity edema, chronic venous stasis, No deformity    EKG today demonstrates Afib Vent. rate 117 BPM PR interval * ms QRS duration 62 ms QT/QTcB 360/502 ms   Echo 07/23/23  1. Left ventricular ejection fraction, by estimation,  is 60 to 65%. The  left ventricle has normal function. The left ventricle has no regional  wall motion abnormalities. Left ventricular diastolic parameters were  normal.   2. Right ventricular systolic function is normal. The right ventricular  size is normal.   3. The mitral valve is normal in structure. No evidence of mitral valve  regurgitation. No evidence of mitral stenosis.   4. The aortic valve is tricuspid. There is mild calcification of the  aortic valve. Aortic valve regurgitation is not visualized. Aortic valve  sclerosis is present, with no evidence of aortic valve stenosis.   5. The inferior vena cava is normal in size with greater than 50%  respiratory variability, suggesting right atrial pressure of 3 mmHg.    CHA2DS2-VASc Score = 4  The patient's score is based upon: CHF History: 1 HTN History: 1 Diabetes History: 0 Stroke History: 2 (  TIA) Vascular Disease History: 0 Age Score: 0 Gender Score: 0       ASSESSMENT AND PLAN: Persistent Atrial Fibrillation (ICD10:  I48.19) The patient's CHA2DS2-VASc score is 4, indicating a 4.8% annual risk of stroke.   S/p dofetilide  loading 05/2022 S/p DCCV 01/08/24 with quick return of afib after 2-3 minutes Patient remains in afib today. He is not currently an ablation candidate due to elevated BMI. After discussion with EP, will stop dofetilide  and start amiodarone after a 3 day washout. Start amiodarone 200 mg BID x 4 weeks then decrease to once daily. If he does not convert chemically, will plan for DCCV. His goal is to get his BMI < 40 to have the ablation.  Continue Eliquis  5 mg BID Continue carvedilol  25 mg BID  Secondary Hypercoagulable State (ICD10:  D68.69) The patient is at significant risk for stroke/thromboembolism based upon his CHA2DS2-VASc Score of 4.  Continue Apixaban  (Eliquis ). No bleeding issues.   HFrecEF EF 60-65%, suspected tachycardia mediated.  Fluid status appears stable today   Suspected OSA The  importance of adequate treatment of sleep apnea was discussed today in order to improve our ability to maintain sinus rhythm long term. Will refer for sleep studyl.   Obesity Body mass index is 51.97 kg/m.  Encouraged lifestyle modification   Follow up in the AF clinic in 3 weeks.     Myrtha Ates PA-C Afib Clinic [facility] [address] [place], [place] [address] [phone]

## 2024-02-08 NOTE — Progress Notes (Incomplete)
 Primary Care Physician: Abraham Abo, MD Referring Physician: [facility] f/u  Cardiologist: Dr. 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 male with a h/o  morbid obesity, HTN, ran out of his medication about 3 months ago due to PCP leaving and closing his practice and patient having difficulty establishing care. Presented to [facility] ED with worsening bilateral lower extremity edema, dyspnea, chest tightness.  He was found to be in A-fib with RVR, significantly fluid overloaded and was admitted to the hospital.  He was placed on diltiazem  infusion, Eliquis  and cardiology was consulted. His weight is usually in the 330s, it was as high as 357 on admission.    He was found to have HFrEF exacerbation and new onset afib with RVR.  He had  TEE cardioversion, but remained in atrial fibrillation for just a few beats with each shock. TEE showed severely reduced EF at 30% with moderate to severe tricuspid regurgitation, rt atrium severely dilated, mild MR.  He was  improved after diuresis and rate control.  Plan was  for outpatient follow up with cardiology for further management.  He is being seen in afib clinic today. He remains in rate controlled afib. He reports feeling much better, not aware of afib at this point. . Weight is stable, no unusual shortness of breath. He noted symptoms for about one month prior to presenting to the ED. He has been told that he snores teribly with apnea noted. He was put on cpap in hospital. He does not smoke, sue drugs or drink alcohol. 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   He is being compliant with meds. No missed eliquis  5 mg bid, he has been on drug for around a week started 05/29/22 prior to TEE which was negative for thrombus.  F/u in afib clinic, 06/20/22. He is here for Tikosyn  admit. He remains in rate controlled afib. Normovolemic. No missed anticoagulation or benadryl  use. His needs  were reviewed by PharmD and no contraindicated drugs on board. Qtc  is acceptable for tikosyn  use.  F/u in afib clinic, 08/29/21,  one week after Tikosyn  load. He did return to SR with cardioversion but ERAF. The plan was if he still had afib on return to clinic, to cardiovert again. Despite being in afib, he feels great. No missed anticoagulation.   Follow up 02/08/24. Patient returns for follow up for atrial fibrillation. ***Patient is s/p DCCV 01/08/24 but reverted back to afib after ~2-3 minutes.   Today, he  denies symptoms of ***palpitations, chest pain, shortness of breath, orthopnea, PND, lower extremity edema, dizziness, presyncope, syncope, snoring, daytime somnolence, bleeding, or neurologic sequela. The patient is tolerating medications without difficulties and is otherwise without complaint today.    Past Medical History:  Diagnosis Date   A-fib (HCC)    Abnormal LFTs 07/06/2012   Cellulitis and abscess of trunk 07/06/2012   Congestive heart failure (CHF) (HCC)    Diarrhea 07/06/2012   Encephalopathy acute 07/06/2012   Hypertension    Hyponatremia 07/06/2012   Nausea & vomiting 07/06/2012   Staph aureus infection 07/06/2012   TIA (transient ischemic attack)    TIA (transient ischemic attack) 04/02/2013    Current Outpatient Medications  Medication Sig Dispense Refill   apixaban  (ELIQUIS ) 5 MG TABS tablet Take 1 tablet (5 mg total) by mouth 2 (two) times daily. 60 tablet 0   benzonatate  (TESSALON   PERLES) 100 MG capsule Take 1 capsule (100 mg total) by mouth every 6 (six) hours as needed. 30 capsule 1   budesonide -formoterol  (SYMBICORT ) 160-4.5 MCG/ACT inhaler Inhale 2 puffs into the lungs in the morning and at bedtime. 10.2 g 0   carvedilol  (COREG ) 25 MG tablet Take 1 tablet (25 mg total) by mouth 2 (two) times daily. 180 tablet 3   dapagliflozin  propanediol (FARXIGA ) 10 MG TABS tablet Take 1 tablet (10 mg total) by mouth daily. 90 tablet 1   dofetilide  (TIKOSYN ) 500 MCG  capsule Take 1 capsule (500 mcg total) by mouth 2 (two) times daily. 180 capsule 1   doxycycline  (VIBRAMYCIN ) 100 MG capsule Take 1 capsule (100 mg total) by mouth 2 (two) times daily. 20 capsule 0   furosemide  (LASIX ) 40 MG tablet Take 1 tablet (40 mg total) by mouth 2 (two) times daily. 60 tablet 1   levalbuterol  (XOPENEX  HFA) 45 MCG/ACT inhaler Inhale 2 puffs into the lungs every 6 (six) hours as needed for wheezing. 45 g 0   pantoprazole  (PROTONIX ) 40 MG tablet Take 1 tablet (40 mg total) by mouth daily. 30 tablet 1   predniSONE  (DELTASONE ) 20 MG tablet Take 2 tablets (40 mg total) by mouth daily. 8 tablet 0   spironolactone  (ALDACTONE ) 25 MG tablet Take 1/2 tablet (12.5 mg total) by mouth daily. 15 tablet 1   No current facility-administered medications for this visit.    ROS- All systems are reviewed and negative except as per the HPI above  Physical Exam: There were no vitals filed for this visit.  Wt Readings from Last 3 Encounters:  02/08/24 (!) 174 kg  01/23/24 (!) 163 kg  01/08/24 (!) 163.3 kg    GEN: Well nourished, well developed in no acute distress NECK: No JVD; No carotid bruits CARDIAC: {EPRHYTHM:28826}, no murmurs, rubs, gallops RESPIRATORY:  Clear to auscultation without rales, wheezing or rhonchi  ABDOMEN: Soft, non-tender, non-distended EXTREMITIES:  No edema; No deformity    EKG today demonstrates ***   Echo 07/23/23  1. Left ventricular ejection fraction, by estimation, is 60 to 65%. The  left ventricle has normal function. The left ventricle has no regional  wall motion abnormalities. Left ventricular diastolic parameters were  normal.   2. Right ventricular systolic function is normal. The right ventricular  size is normal.   3. The mitral valve is normal in structure. No evidence of mitral valve  regurgitation. No evidence of mitral stenosis.   4. The aortic valve is tricuspid. There is mild calcification of the  aortic valve. Aortic valve  regurgitation is not visualized. Aortic valve  sclerosis is present, with no evidence of aortic valve stenosis.   5. The inferior vena cava is normal in size with greater than 50%  respiratory variability, suggesting right atrial pressure of 3 mmHg.    CHA2DS2-VASc Score = 2  The patient's score is based upon: CHF History: 1 HTN History: 1 Diabetes History: 0 Stroke History: 0 Vascular Disease History: 0 Age Score: 0 Gender Score: 0   {Confirm score is correct.  If not, click here to update score.  REFRESH note.  :1}   ***htn, chf, tia ASSESSMENT AND PLAN: Persistent Atrial Fibrillation (ICD10:  I48.19) The patient's CHA2DS2-VASc score is 2, indicating a 2.2% annual risk of stroke.   S/p dofetilide  loading 05/2022 S/p DCCV 01/08/24 with quick return of afib after 2-3 minutes Patient remains in afib today. He is not currently an ablation candidate due to elevated BMI. ***  amio Continue Eliquis  5 mg BID Continue carvedilol  25 mg BID  Secondary Hypercoagulable State (ICD10:  D68.69){Click to add to Prob List or Visit Dx  :[id number]} The patient is at significant risk for stroke/thromboembolism based upon his CHA2DS2-VASc Score of 2.  Continue Apixaban  (Eliquis ). No bleeding issues.   High Risk Medication Monitoring (ICD 10: Z79.899) {medmonitoring:32394}   HFrecEF EF 60-65%, suspected tachycardia mediated.  Fluid status appears stable today ***   Suspected OSA The importance of adequate treatment of sleep apnea was discussed today in order to improve our ability to maintain sinus rhythm long term. ***  Obesity There is no height or weight on file to calculate BMI.  Encouraged lifestyle modification ***   Follow up ***    Myrtha Ates PA-C Afib Clinic [facility] [address] [place], [place] [address] [phone]

## 2024-02-09 LAB — TSH: TSH: 4.41 u[IU]/mL (ref 0.450–4.500)

## 2024-02-11 ENCOUNTER — Ambulatory Visit (HOSPITAL_COMMUNITY): Payer: Self-pay | Admitting: Physician Assistant

## 2024-02-11 ENCOUNTER — Encounter: Payer: Self-pay | Admitting: Family Medicine

## 2024-02-11 NOTE — Progress Notes (Signed)
 Established Patient Office Visit  Subjective    Patient ID: Anthony Delgado, male    DOB: Jun 19, 1976  Age: 48 y.o. MRN: [id number]  CC:  Chief Complaint  Patient presents with   paperwork     HPI Anthony Delgado presents for completiont of form for employer regarding recent ED visit for chest pain 2/2 heart failure. Patient reports tha the is doing well and that he is able to return to work.   Outpatient Encounter Medications as of 02/08/2024  Medication Sig   apixaban  (ELIQUIS ) 5 MG TABS tablet Take 1 tablet (5 mg total) by mouth 2 (two) times daily.   benzonatate  (TESSALON  PERLES) 100 MG capsule Take 1 capsule (100 mg total) by mouth every 6 (six) hours as needed.   budesonide -formoterol  (SYMBICORT ) 160-4.5 MCG/ACT inhaler Inhale 2 puffs into the lungs in the morning and at bedtime.   carvedilol  (COREG ) 25 MG tablet Take 1 tablet (25 mg total) by mouth 2 (two) times daily.   dapagliflozin  propanediol (FARXIGA ) 10 MG TABS tablet Take 1 tablet (10 mg total) by mouth daily. (Patient not taking: Reported on 02/08/2024)   doxycycline  (VIBRAMYCIN ) 100 MG capsule Take 1 capsule (100 mg total) by mouth 2 (two) times daily.   furosemide  (LASIX ) 40 MG tablet Take 1 tablet (40 mg total) by mouth 2 (two) times daily.   levalbuterol  (XOPENEX  HFA) 45 MCG/ACT inhaler Inhale 2 puffs into the lungs every 6 (six) hours as needed for wheezing.   pantoprazole  (PROTONIX ) 40 MG tablet Take 1 tablet (40 mg total) by mouth daily. (Patient not taking: Reported on 02/08/2024)   predniSONE  (DELTASONE ) 20 MG tablet Take 2 tablets (40 mg total) by mouth daily.   spironolactone  (ALDACTONE ) 25 MG tablet Take 1/2 tablet (12.5 mg total) by mouth daily.   [DISCONTINUED] dofetilide  (TIKOSYN ) 500 MCG capsule Take 1 capsule (500 mcg total) by mouth 2 (two) times daily.   No facility-administered encounter medications on file as of 02/08/2024.    Past Medical History:  Diagnosis Date   A-fib (HCC)    Abnormal LFTs  07/06/2012   Cellulitis and abscess of trunk 07/06/2012   Congestive heart failure (CHF) (HCC)    Diarrhea 07/06/2012   Encephalopathy acute 07/06/2012   Hypertension    Hyponatremia 07/06/2012   Nausea & vomiting 07/06/2012   Staph aureus infection 07/06/2012   TIA (transient ischemic attack)    TIA (transient ischemic attack) 04/02/2013    Past Surgical History:  Procedure Laterality Date   BIOPSY  08/09/2023   Procedure: BIOPSY;  Surgeon: Daina Drum, MD;  Location: [facility] ENDOSCOPY;  Service: Gastroenterology;;   CARDIOVERSION N/A 06/01/2022   Procedure: CARDIOVERSION;  Surgeon: Hugh Madura, MD;  Location: [facility] ENDOSCOPY;  Service: Cardiovascular;  Laterality: N/A;   CARDIOVERSION N/A 06/22/2022   Procedure: CARDIOVERSION;  Surgeon: Harrold Lincoln, MD;  Location[facility] ENDOSCOPY;  Service: Cardiovascular;  Laterality: N/A;   CARDIOVERSION N/A 07/05/2022   Procedure: CARDIOVERSION;  Surgeon: Jerryl Morin, DO;  Location: [facility] ENDOSCOPY;  Service: Cardiovascular;  Laterality: N/A;   CARDIOVERSION N/A 01/08/2024   Procedure: CARDIOVERSION;  Surgeon: Luana Rumple, MD;  Location: [facility] INVASIVE CV LAB;  Service: Cardiovascular;  Laterality: N/A;   CHOLECYSTECTOMY  2009   COLONOSCOPY WITH PROPOFOL  N/A 08/09/2023   Procedure: COLONOSCOPY WITH PROPOFOL ;  Surgeon: Daina Drum, MD;  Location: [facility] ENDOSCOPY;  Service: Gastroenterology;  Laterality: N/A;   ESOPHAGOGASTRODUODENOSCOPY (EGD) WITH PROPOFOL  N/A 08/09/2023   Procedure: ESOPHAGOGASTRODUODENOSCOPY (EGD) WITH PROPOFOL ;  Surgeon:  Daina Drum, MD;  Location: [facility] ENDOSCOPY;  Service: Gastroenterology;  Laterality: N/A;   POLYPECTOMY  08/09/2023   Procedure: POLYPECTOMY;  Surgeon: Daina Drum, MD;  Location: [facility] ENDOSCOPY;  Service: Gastroenterology;;   TEE WITHOUT CARDIOVERSION N/A 06/01/2022   Procedure: TRANSESOPHAGEAL ECHOCARDIOGRAM (TEE);  Surgeon: Hugh Madura, MD;  Location: [facility] ENDOSCOPY;  Service: Cardiovascular;   Laterality: N/A;    Family History  Problem Relation Age of Onset   Hypertension Mother    Kidney failure Father    Hypertension Sister    Prostate cancer Maternal Uncle    Hypertension Maternal Uncle    Stroke Neg Hx        None at early ages   CAD Neg Hx        None at early ages    Social History   Socioeconomic History   Marital status: Married    Spouse name: Haskell Linker   Number of children: 4   Years of education: Not on file   Highest education level: High school graduate  Occupational History   Occupation: Ecologist  Tobacco Use   Smoking status: Never   Smokeless tobacco: Never   Tobacco comments:    Never smoked 02/08/24  Vaping Use   Vaping status: Never Used  Substance and Sexual Activity   Alcohol use: No   Drug use: No   Sexual activity: Yes    Partners: Female  Other Topics Concern   Not on file  Social History Narrative   Not on file   Social Drivers of Health   Financial Resource Strain: Low Risk  (06/01/2022)   Overall Financial Resource Strain (CARDIA)    Difficulty of Paying Living Expenses: Not hard at all  Food Insecurity: No Food Insecurity (06/20/2022)   Hunger Vital Sign    Worried About Running Out of Food in the Last Year: Never true    Ran Out of Food in the Last Year: Never true  Transportation Needs: No Transportation Needs (06/20/2022)   PRAPARE - Administrator, Civil Service (Medical): No    Lack of Transportation (Non-Medical): No  Physical Activity: Not on file  Stress: Not on file  Social Connections: Not on file  Intimate Partner Violence: Not At Risk (06/20/2022)   Humiliation, Afraid, Rape, and Kick questionnaire    Fear of Current or Ex-Partner: No    Emotionally Abused: No    Physically Abused: No    Sexually Abused: No    Review of Systems  All other systems reviewed and are negative.       Objective    BP 116/83 (BP Location: Right Arm, Patient Position: Sitting)   Pulse 100   Ht 6' (1.829 m)    Wt (!) 383 lb 9.6 oz (174 kg)   SpO2 94%   BMI 52.03 kg/m   Physical Exam Vitals and nursing note reviewed.  Constitutional:      General: He is not in acute distress.    Appearance: He is obese.   Cardiovascular:     Rate and Rhythm: Normal rate and regular rhythm.  Pulmonary:     Effort: Pulmonary effort is normal.     Breath sounds: Normal breath sounds.  Abdominal:     Palpations: Abdomen is soft.     Tenderness: There is no abdominal tenderness.   Neurological:     General: No focal deficit present.     Mental Status: He is alert and oriented to person, place, and time.  Assessment & Plan:   Encounter for completion of form with patient  HFrEF (heart failure with reduced ejection fraction) (HCC)  Class 3 severe obesity due to excess calories with serious comorbidity and body mass index (BMI) of 50.0 to 59.9 in adult   Form was completed. Follow up with consultant for furthe reval/mgt  No follow-ups on file.   Arlo Lama, MD

## 2024-02-13 ENCOUNTER — Telehealth: Payer: Self-pay | Admitting: Family Medicine

## 2024-02-13 NOTE — Telephone Encounter (Signed)
 A document form from [employer] has been faxed: Clarification Needed for FMLA, to be filled out by provider. Send document back via Fax within 7-days to 10 business days. Document is located in providers tray at front office.          Fax number: [phone] Pt last seen for this 02/08/24

## 2024-02-13 NOTE — Telephone Encounter (Signed)
 Noted

## 2024-02-20 NOTE — Telephone Encounter (Signed)
 Paper work was given to provider on 02/13/2024 from [place] [employer] claims stating they need more  information on patient claim from provider.   02/08/2024 office visit was already submitted

## 2024-02-21 ENCOUNTER — Telehealth: Payer: Self-pay | Admitting: Family Medicine

## 2024-02-21 NOTE — Telephone Encounter (Signed)
 Left message to patient letting him know Via Provider Dr. Tanda the [place] forms that he had sent in will not be able to be filled out she stated he would need to have his cardiologist fill out the paperwork. Let patient know if he has any questions he can call the office. Let him know also paperwork would be upfront if he would like to come pick it up.

## 2024-02-27 ENCOUNTER — Telehealth: Payer: Self-pay | Admitting: Family Medicine

## 2024-02-27 NOTE — Telephone Encounter (Signed)
 Copied from CRM [id number]. Topic: General - Other >> Feb 27, 2024  3:26 PM [place] B wrote: Reason for CRM: kelly from [place] caled in about needing FMLA paperwork, updated, form shows pt out of work thru June 1 but should be June 2, also needs, date pt was seen  and if pt wa given a rx or referral was made

## 2024-02-28 NOTE — Telephone Encounter (Signed)
 Called patient unable to make contact, voicemail has been left

## 2024-03-03 ENCOUNTER — Other Ambulatory Visit: Payer: Self-pay

## 2024-03-03 ENCOUNTER — Other Ambulatory Visit (HOSPITAL_COMMUNITY): Payer: Self-pay

## 2024-03-03 ENCOUNTER — Other Ambulatory Visit (HOSPITAL_COMMUNITY): Payer: Self-pay | Admitting: Physician Assistant

## 2024-03-03 ENCOUNTER — Ambulatory Visit (HOSPITAL_COMMUNITY): Admitting: Physician Assistant

## 2024-03-03 ENCOUNTER — Encounter (HOSPITAL_COMMUNITY): Payer: Self-pay

## 2024-03-03 ENCOUNTER — Other Ambulatory Visit: Payer: Self-pay | Admitting: Nurse Practitioner

## 2024-03-03 MED ORDER — BUDESONIDE-FORMOTEROL FUMARATE 160-4.5 MCG/ACT IN AERO
2.0000 | INHALATION_SPRAY | Freq: Two times a day (BID) | RESPIRATORY_TRACT | 0 refills | Status: DC
  Filled 2024-03-03: qty 10.2, 30d supply, fill #0

## 2024-03-03 NOTE — Progress Notes (Incomplete)
 Primary Care Physician: Tanda Bleacher, MD Referring Physician: [facility] f/u  Cardiologist: Dr. Pietro  Primary EP: Dr Inocencio Madeleine Gearing is a 48 y.o. male with a h/o  morbid obesity, HTN, ran out of his medication about 3 months ago due to PCP leaving and closing his practice and patient having difficulty establishing care. Presented to [facility] ED with worsening bilateral lower extremity edema, dyspnea, chest tightness.  He was found to be in A-fib with RVR, significantly fluid overloaded and was admitted to the hospital.  He was placed on diltiazem  infusion, Eliquis  and cardiology was consulted. His weight is usually in the 330s, it was as high as 357 on admission.    He was found to have HFrEF exacerbation and new onset afib with RVR.  He had  TEE cardioversion, but remained in atrial fibrillation for just a few beats with each shock. TEE showed severely reduced EF at 30% with moderate to severe tricuspid regurgitation, rt atrium severely dilated, mild MR.  He was  improved after diuresis and rate control.  Plan was  for outpatient follow up with cardiology for further management.  He is being seen in afib clinic today. He remains in rate controlled afib. He reports feeling much better, not aware of afib at this point. . Weight is stable, no unusual shortness of breath. He noted symptoms for about one month prior to presenting to the ED. He has been told that he snores teribly with apnea noted. He was put on cpap in hospital. He does not smoke, sue drugs or drink alcohol. 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   He is being compliant with meds. No missed eliquis  5 mg bid, he has been on drug for around a week started 05/29/22 prior to TEE which was negative for thrombus.  F/u in afib clinic, 06/20/22. He is here for Tikosyn  admit. He remains in rate controlled afib. Normovolemic. No missed anticoagulation or benadryl  use. His needs  were reviewed by PharmD and no contraindicated drugs on board. Qtc  is acceptable for tikosyn  use.  F/u in afib clinic, 08/29/21,  one week after Tikosyn  load. He did return to SR with cardioversion but ERAF. The plan was if he still had afib on return to clinic, to cardiovert again. Despite being in afib, he feels great. No missed anticoagulation.   Follow up 02/08/24. Patient returns for follow up for atrial fibrillation. Patient is s/p DCCV 01/08/24 but reverted back to afib after ~2-3 minutes. He does not have tachypalpitations but does note increased lower extremity edema when in afib. His edema responds well to Lasix . No bleeding issues on anticoagulation.   Follow up 03/03/24. Patient returns for follow up for atrial fibrillation ***  Today, he  denies symptoms of ***palpitations, chest pain, shortness of breath, orthopnea, PND, lower extremity edema, dizziness, presyncope, syncope, bleeding, or neurologic sequela. The patient is tolerating medications without difficulties and is otherwise without complaint today.    Past Medical History:  Diagnosis Date   A-fib (HCC)    Abnormal LFTs 07/06/2012   Cellulitis and abscess of trunk 07/06/2012   Congestive heart failure (CHF) (HCC)    Diarrhea 07/06/2012   Encephalopathy acute 07/06/2012   Hypertension    Hyponatremia 07/06/2012   Nausea & vomiting 07/06/2012   Staph aureus infection 07/06/2012   TIA (transient ischemic attack)    TIA (transient ischemic attack) 04/02/2013  Current Outpatient Medications  Medication Sig Dispense Refill   amiodarone  (PACERONE ) 200 MG tablet Take 1 tablet (200 mg total) by mouth 2 (two) times daily for 30 days, THEN 1 tablet (200 mg total) daily. 60 tablet 3   apixaban  (ELIQUIS ) 5 MG TABS tablet Take 1 tablet (5 mg total) by mouth 2 (two) times daily. 60 tablet 0   benzonatate  (TESSALON  PERLES) 100 MG capsule Take 1 capsule (100 mg total) by mouth every 6 (six) hours as needed. 30 capsule 1    budesonide -formoterol  (SYMBICORT ) 160-4.5 MCG/ACT inhaler Inhale 2 puffs into the lungs in the morning and at bedtime. 10.2 g 0   carvedilol  (COREG ) 25 MG tablet Take 1 tablet (25 mg total) by mouth 2 (two) times daily. 180 tablet 3   dapagliflozin  propanediol (FARXIGA ) 10 MG TABS tablet Take 1 tablet (10 mg total) by mouth daily. (Patient not taking: Reported on 02/08/2024) 90 tablet 1   doxycycline  (VIBRAMYCIN ) 100 MG capsule Take 1 capsule (100 mg total) by mouth 2 (two) times daily. 20 capsule 0   furosemide  (LASIX ) 40 MG tablet Take 1 tablet (40 mg total) by mouth 2 (two) times daily. 60 tablet 1   levalbuterol  (XOPENEX  HFA) 45 MCG/ACT inhaler Inhale 2 puffs into the lungs every 6 (six) hours as needed for wheezing. 45 g 0   pantoprazole  (PROTONIX ) 40 MG tablet Take 1 tablet (40 mg total) by mouth daily. (Patient not taking: Reported on 02/08/2024) 30 tablet 1   predniSONE  (DELTASONE ) 20 MG tablet Take 2 tablets (40 mg total) by mouth daily. 8 tablet 0   spironolactone  (ALDACTONE ) 25 MG tablet Take 1/2 tablet (12.5 mg total) by mouth daily. 15 tablet 1   No current facility-administered medications for this visit.    ROS- All systems are reviewed and negative except as per the HPI above  Physical Exam: There were no vitals filed for this visit.  Wt Readings from Last 3 Encounters:  02/08/24 (!) 173.8 kg  02/08/24 (!) 174 kg  01/23/24 (!) 163 kg    GEN: Well nourished, well developed in no acute distress NECK: No JVD; No carotid bruits CARDIAC: {EPRHYTHM:[id number]}, no murmurs, rubs, gallops RESPIRATORY:  Clear to auscultation without rales, wheezing or rhonchi  ABDOMEN: Soft, non-tender, non-distended EXTREMITIES:  No edema; No deformity    EKG today demonstrates ***   Echo 07/23/23  1. Left ventricular ejection fraction, by estimation, is 60 to 65%. The  left ventricle has normal function. The left ventricle has no regional  wall motion abnormalities. Left ventricular diastolic  parameters were  normal.   2. Right ventricular systolic function is normal. The right ventricular  size is normal.   3. The mitral valve is normal in structure. No evidence of mitral valve  regurgitation. No evidence of mitral stenosis.   4. The aortic valve is tricuspid. There is mild calcification of the  aortic valve. Aortic valve regurgitation is not visualized. Aortic valve  sclerosis is present, with no evidence of aortic valve stenosis.   5. The inferior vena cava is normal in size with greater than 50%  respiratory variability, suggesting right atrial pressure of 3 mmHg.    CHA2DS2-VASc Score = 4  The patient's score is based upon: CHF History: 1 HTN History: 1 Diabetes History: 0 Stroke History: 2 (TIA) Vascular Disease History: 0 Age Score: 0 Gender Score: 0   {Confirm score is correct.  If not, click here to update score.  REFRESH note.  :1}  ASSESSMENT AND PLAN: Persistent Atrial Fibrillation (ICD10:  I48.19) The patient's CHA2DS2-VASc score is 4, indicating a 4.8% annual risk of stroke.   S/p DCCV 01/08/24 with quick return of afib after 2-3 minutes on dofetilide .  Dofetilide  discontinued and started on amiodarone .  ***dccv Continue Eliquis  5 mg BID Continue carvedilol  25 mg BID  Secondary Hypercoagulable State (ICD10:  D68.69){Click to add to Prob List or Visit Dx  :[id number]} The patient is at significant risk for stroke/thromboembolism based upon his CHA2DS2-VASc Score of 4.  Continue Apixaban  (Eliquis ). No bleeding issues.   High Risk Medication Monitoring (ICD 10: Z79.899) Intervals on ECG acceptable for amiodarone  monitoring. ***  HFrecEF EF 60-65%, suspected tachycardia mediated.  Fluid status appears stable today ***   Suspected OSA Referred for sleep study at previous visit.  ***  Obesity There is no height or weight on file to calculate BMI.  Encouraged lifestyle modification ***   Follow up ***   {Are you ordering a CV Procedure  (e.g. stress test, cath, DCCV, TEE, etc)?   Press F2        :[id number]}    Daril Kicks PA-C Afib Clinic [facility] [address] [place], [place] [address] [phone]

## 2024-03-11 ENCOUNTER — Encounter (HOSPITAL_COMMUNITY): Payer: Self-pay | Admitting: Physician Assistant

## 2024-03-11 ENCOUNTER — Ambulatory Visit (HOSPITAL_COMMUNITY): Payer: Self-pay | Admitting: Physician Assistant

## 2024-03-11 ENCOUNTER — Ambulatory Visit (HOSPITAL_COMMUNITY)
Admission: RE | Admit: 2024-03-11 | Discharge: 2024-03-11 | Disposition: A | Discharge status: Home / Self Care | Source: Ambulatory Visit | Attending: Physician Assistant | Admitting: Physician Assistant

## 2024-03-11 ENCOUNTER — Ambulatory Visit (HOSPITAL_COMMUNITY): Admitting: Physician Assistant

## 2024-03-11 ENCOUNTER — Other Ambulatory Visit (HOSPITAL_COMMUNITY): Payer: Self-pay

## 2024-03-11 VITALS — BP 110/80 | HR 83 | Ht 72.0 in | Wt 387.8 lb

## 2024-03-11 DIAGNOSIS — Z5181 Encounter for therapeutic drug level monitoring: Secondary | ICD-10-CM | POA: Diagnosis not present

## 2024-03-11 DIAGNOSIS — D6869 Other thrombophilia: Secondary | ICD-10-CM

## 2024-03-11 DIAGNOSIS — I4819 Other persistent atrial fibrillation: Secondary | ICD-10-CM

## 2024-03-11 DIAGNOSIS — Z79899 Other long term (current) drug therapy: Secondary | ICD-10-CM

## 2024-03-11 DIAGNOSIS — I5032 Chronic diastolic (congestive) heart failure: Secondary | ICD-10-CM | POA: Diagnosis not present

## 2024-03-11 LAB — BASIC METABOLIC PANEL WITH GFR
BUN/Creatinine Ratio: 10 (ref 9–20)
BUN: 13 mg/dL (ref 6–24)
CO2: 26 mmol/L (ref 20–29)
Calcium: 8.8 mg/dL (ref 8.7–10.2)
Chloride: 101 mmol/L (ref 96–106)
Creatinine, Ser: 1.27 mg/dL (ref 0.76–1.27)
Glucose: 121 mg/dL — ABNORMAL HIGH (ref 70–99)
Potassium: 4 mmol/L (ref 3.5–5.2)
Sodium: 135 mmol/L (ref 134–144)
eGFR: 69 mL/min/1.73 (ref >59)

## 2024-03-11 LAB — CBC
Hematocrit: 46.1 % (ref 37.5–51.0)
Hemoglobin: 15.4 g/dL (ref 13.0–17.7)
MCH: 31.1 pg (ref 26.6–33.0)
MCHC: 33.4 g/dL (ref 31.5–35.7)
MCV: 93 fL (ref 79–97)
Platelets: 215 x10E3/uL (ref 150–450)
RBC: 4.95 x10E6/uL (ref 4.14–5.80)
RDW: 14.6 % (ref 11.6–15.4)
WBC: 5.4 x10E3/uL (ref 3.4–10.8)

## 2024-03-11 MED ORDER — AMIODARONE HCL 200 MG PO TABS
200.0000 mg | ORAL_TABLET | Freq: Every day | ORAL | 3 refills | Status: DC
  Filled 2024-03-11: qty 30, 30d supply, fill #0

## 2024-03-11 NOTE — Progress Notes (Signed)
 Primary Care Physician: Tanda Bleacher, MD Referring Physician: [facility] f/u  Cardiologist: Dr. Pietro  Primary EP: Dr Inocencio Madeleine Gearing is a 48 y.o. male with a h/o  morbid obesity, HTN, ran out of his medication about 3 months ago due to PCP leaving and closing his practice and patient having difficulty establishing care. Presented to [facility] ED with worsening bilateral lower extremity edema, dyspnea, chest tightness.  He was found to be in A-fib with RVR, significantly fluid overloaded and was admitted to the hospital.  He was placed on diltiazem  infusion, Eliquis  and cardiology was consulted. His weight is usually in the 330s, it was as high as 357 on admission.    He was found to have HFrEF exacerbation and new onset afib with RVR.  He had  TEE cardioversion, but remained in atrial fibrillation for just a few beats with each shock. TEE showed severely reduced EF at 30% with moderate to severe tricuspid regurgitation, rt atrium severely dilated, mild MR.  He was  improved after diuresis and rate control.  Plan was  for outpatient follow up with cardiology for further management.  He is being seen in afib clinic today. He remains in rate controlled afib. He reports feeling much better, not aware of afib at this point. . Weight is stable, no unusual shortness of breath. He noted symptoms for about one month prior to presenting to the ED. He has been told that he snores teribly with apnea noted. He was put on cpap in hospital. He does not smoke, sue drugs or drink alcohol. 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   He is being compliant with meds. No missed eliquis  5 mg bid, he has been on drug for around a week started 05/29/22 prior to TEE which was negative for thrombus.  F/u in afib clinic, 06/20/22. He is here for Tikosyn  admit. He remains in rate controlled afib. Normovolemic. No missed anticoagulation or benadryl  use. His needs  were reviewed by PharmD and no contraindicated drugs on board. Qtc  is acceptable for tikosyn  use.  F/u in afib clinic, 08/29/21,  one week after Tikosyn  load. He did return to SR with cardioversion but ERAF. The plan was if he still had afib on return to clinic, to cardiovert again. Despite being in afib, he feels great. No missed anticoagulation.   Follow up 02/08/24. Patient returns for follow up for atrial fibrillation. Patient is s/p DCCV 01/08/24 but reverted back to afib after ~2-3 minutes. He does not have tachypalpitations but does note increased lower extremity edema when in afib. His edema responds well to Lasix . No bleeding issues on anticoagulation.   Follow up 03/03/24. Patient returns for follow up for atrial fibrillation and amiodarone  monitoring. He remains in rate controlled afib with symptoms of SOB and lower extremity edema. He has taken extra doses of lasix  to help with his edema. No bleeding issues on anticoagulation.   Today, he  denies symptoms of palpitations, chest pain, orthopnea, PND, dizziness, presyncope, syncope, bleeding, or neurologic sequela. The patient is tolerating medications without difficulties and is otherwise without complaint today.    Past Medical History:  Diagnosis Date   A-fib (HCC)    Abnormal LFTs 07/06/2012   Cellulitis and abscess of trunk 07/06/2012   Congestive heart failure (CHF) (HCC)    Diarrhea 07/06/2012   Encephalopathy acute 07/06/2012   Hypertension    Hyponatremia 07/06/2012  Nausea & vomiting 07/06/2012   Staph aureus infection 07/06/2012   TIA (transient ischemic attack)    TIA (transient ischemic attack) 04/02/2013    Current Outpatient Medications  Medication Sig Dispense Refill   amiodarone  (PACERONE ) 200 MG tablet Take 1 tablet (200 mg total) by mouth 2 (two) times daily for 30 days, THEN 1 tablet (200 mg total) daily. 60 tablet 3   apixaban  (ELIQUIS ) 5 MG TABS tablet Take 1 tablet (5 mg total) by mouth 2 (two) times daily.  60 tablet 0   benzonatate  (TESSALON  PERLES) 100 MG capsule Take 1 capsule (100 mg total) by mouth every 6 (six) hours as needed. 30 capsule 1   budesonide -formoterol  (SYMBICORT ) 160-4.5 MCG/ACT inhaler Inhale 2 puffs into the lungs in the morning and at bedtime. 10.2 g 0   carvedilol  (COREG ) 25 MG tablet Take 1 tablet (25 mg total) by mouth 2 (two) times daily. 180 tablet 3   doxycycline  (VIBRAMYCIN ) 100 MG capsule Take 1 capsule (100 mg total) by mouth 2 (two) times daily. 20 capsule 0   furosemide  (LASIX ) 40 MG tablet Take 1 tablet (40 mg total) by mouth 2 (two) times daily. 60 tablet 1   levalbuterol  (XOPENEX  HFA) 45 MCG/ACT inhaler Inhale 2 puffs into the lungs every 6 (six) hours as needed for wheezing. 45 g 0   predniSONE  (DELTASONE ) 20 MG tablet Take 2 tablets (40 mg total) by mouth daily. 8 tablet 0   spironolactone  (ALDACTONE ) 25 MG tablet Take 1/2 tablet (12.5 mg total) by mouth daily. 15 tablet 1   dapagliflozin  propanediol (FARXIGA ) 10 MG TABS tablet Take 1 tablet (10 mg total) by mouth daily. (Patient not taking: Reported on 02/08/2024) 90 tablet 1   pantoprazole  (PROTONIX ) 40 MG tablet Take 1 tablet (40 mg total) by mouth daily. (Patient not taking: Reported on 02/08/2024) 30 tablet 1   No current facility-administered medications for this encounter.    ROS- All systems are reviewed and negative except as per the HPI above  Physical Exam: Vitals:   03/11/24 0958  BP: 110/80  Pulse: 83  Weight: (!) 175.9 kg  Height: 6' (1.829 m)    Wt Readings from Last 3 Encounters:  03/11/24 (!) 175.9 kg  02/08/24 (!) 173.8 kg  02/08/24 (!) 174 kg    GEN: Well nourished, well developed in no acute distress NECK: No JVD CARDIAC: Irregularly irregular rate and rhythm, no murmurs, rubs, gallops RESPIRATORY:  Clear to auscultation without rales, wheezing or rhonchi  ABDOMEN: Soft, non-tender, non-distended EXTREMITIES:  2+ bilateral lower extremity edema, No deformity    EKG today  demonstrates Afib Vent. rate 83 BPM PR interval * ms QRS duration 74 ms QT/QTcB 380/446 ms   Echo 07/23/23  1. Left ventricular ejection fraction, by estimation, is 60 to 65%. The  left ventricle has normal function. The left ventricle has no regional  wall motion abnormalities. Left ventricular diastolic parameters were  normal.   2. Right ventricular systolic function is normal. The right ventricular  size is normal.   3. The mitral valve is normal in structure. No evidence of mitral valve  regurgitation. No evidence of mitral stenosis.   4. The aortic valve is tricuspid. There is mild calcification of the  aortic valve. Aortic valve regurgitation is not visualized. Aortic valve  sclerosis is present, with no evidence of aortic valve stenosis.   5. The inferior vena cava is normal in size with greater than 50%  respiratory variability, suggesting right atrial pressure  of 3 mmHg.    CHA2DS2-VASc Score = 4  The patient's score is based upon: CHF History: 1 HTN History: 1 Diabetes History: 0 Stroke History: 2 (TIA) Vascular Disease History: 0 Age Score: 0 Gender Score: 0       ASSESSMENT AND PLAN: Persistent Atrial Fibrillation (ICD10:  I48.19) The patient's CHA2DS2-VASc score is 4, indicating a 4.8% annual risk of stroke.   S/p DCCV 01/08/24 with quick return of afib after 2-3 minutes on dofetilide .  Dofetilide  discontinued and started on amiodarone .  Will arrange for DCCV now that he has loaded on amiodarone .  Check bmet/cbc Decrease amiodarone  to 200 mg daily Continue Eliquis  5 mg BID Continue carvedilol  25 mg BID  Secondary Hypercoagulable State (ICD10:  D68.69) The patient is at significant risk for stroke/thromboembolism based upon his CHA2DS2-VASc Score of 4.  Continue Apixaban  (Eliquis ). No bleeding issues.   High Risk Medication Monitoring (ICD 10: Z79.899) Intervals on ECG acceptable for amiodarone  monitoring.   HFrecEF EF 60-65%, suspected tachycardia  mediated.  Hopefully edema will improve with SR. Check bmet as above.  Continue Lasix  40 mg BID   Suspected OSA Patient has sleep consult on 7/17 at [facility].  Obesity Body mass index is 52.6 kg/m.  Encouraged lifestyle modification   Follow up in the AF clinic post DCCV.    Informed Consent   Shared Decision Making/Informed Consent The risks (stroke, cardiac arrhythmias rarely resulting in the need for a temporary or permanent pacemaker, skin irritation or burns and complications associated with conscious sedation including aspiration, arrhythmia, respiratory failure and death), benefits (restoration of normal sinus rhythm) and alternatives of a direct current cardioversion were explained in detail to Mr. Ribas and he agrees to proceed.        Daril Kicks PA-C Afib Clinic [facility] [address] [place], [place] [address] [phone]

## 2024-03-11 NOTE — H&P (View-Only) (Signed)
 Primary Care Physician: Tanda Bleacher, MD Referring Physician: [facility] f/u  Cardiologist: Dr. Pietro  Primary EP: Dr Inocencio Madeleine Gearing is a 48 y.o. male with a h/o  morbid obesity, HTN, ran out of his medication about 3 months ago due to PCP leaving and closing his practice and patient having difficulty establishing care. Presented to [facility] ED with worsening bilateral lower extremity edema, dyspnea, chest tightness.  He was found to be in A-fib with RVR, significantly fluid overloaded and was admitted to the hospital.  He was placed on diltiazem  infusion, Eliquis  and cardiology was consulted. His weight is usually in the 330s, it was as high as 357 on admission.    He was found to have HFrEF exacerbation and new onset afib with RVR.  He had  TEE cardioversion, but remained in atrial fibrillation for just a few beats with each shock. TEE showed severely reduced EF at 30% with moderate to severe tricuspid regurgitation, rt atrium severely dilated, mild MR.  He was  improved after diuresis and rate control.  Plan was  for outpatient follow up with cardiology for further management.  He is being seen in afib clinic today. He remains in rate controlled afib. He reports feeling much better, not aware of afib at this point. . Weight is stable, no unusual shortness of breath. He noted symptoms for about one month prior to presenting to the ED. He has been told that he snores teribly with apnea noted. He was put on cpap in hospital. He does not smoke, sue drugs or drink alcohol. 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   He is being compliant with meds. No missed eliquis  5 mg bid, he has been on drug for around a week started 05/29/22 prior to TEE which was negative for thrombus.  F/u in afib clinic, 06/20/22. He is here for Tikosyn  admit. He remains in rate controlled afib. Normovolemic. No missed anticoagulation or benadryl  use. His needs  were reviewed by PharmD and no contraindicated drugs on board. Qtc  is acceptable for tikosyn  use.  F/u in afib clinic, 08/29/21,  one week after Tikosyn  load. He did return to SR with cardioversion but ERAF. The plan was if he still had afib on return to clinic, to cardiovert again. Despite being in afib, he feels great. No missed anticoagulation.   Follow up 02/08/24. Patient returns for follow up for atrial fibrillation. Patient is s/p DCCV 01/08/24 but reverted back to afib after ~2-3 minutes. He does not have tachypalpitations but does note increased lower extremity edema when in afib. His edema responds well to Lasix . No bleeding issues on anticoagulation.   Follow up 03/03/24. Patient returns for follow up for atrial fibrillation and amiodarone  monitoring. He remains in rate controlled afib with symptoms of SOB and lower extremity edema. He has taken extra doses of lasix  to help with his edema. No bleeding issues on anticoagulation.   Today, he  denies symptoms of palpitations, chest pain, orthopnea, PND, dizziness, presyncope, syncope, bleeding, or neurologic sequela. The patient is tolerating medications without difficulties and is otherwise without complaint today.    Past Medical History:  Diagnosis Date   A-fib (HCC)    Abnormal LFTs 07/06/2012   Cellulitis and abscess of trunk 07/06/2012   Congestive heart failure (CHF) (HCC)    Diarrhea 07/06/2012   Encephalopathy acute 07/06/2012   Hypertension    Hyponatremia 07/06/2012  Nausea & vomiting 07/06/2012   Staph aureus infection 07/06/2012   TIA (transient ischemic attack)    TIA (transient ischemic attack) 04/02/2013    Current Outpatient Medications  Medication Sig Dispense Refill   amiodarone  (PACERONE ) 200 MG tablet Take 1 tablet (200 mg total) by mouth 2 (two) times daily for 30 days, THEN 1 tablet (200 mg total) daily. 60 tablet 3   apixaban  (ELIQUIS ) 5 MG TABS tablet Take 1 tablet (5 mg total) by mouth 2 (two) times daily.  60 tablet 0   benzonatate  (TESSALON  PERLES) 100 MG capsule Take 1 capsule (100 mg total) by mouth every 6 (six) hours as needed. 30 capsule 1   budesonide -formoterol  (SYMBICORT ) 160-4.5 MCG/ACT inhaler Inhale 2 puffs into the lungs in the morning and at bedtime. 10.2 g 0   carvedilol  (COREG ) 25 MG tablet Take 1 tablet (25 mg total) by mouth 2 (two) times daily. 180 tablet 3   doxycycline  (VIBRAMYCIN ) 100 MG capsule Take 1 capsule (100 mg total) by mouth 2 (two) times daily. 20 capsule 0   furosemide  (LASIX ) 40 MG tablet Take 1 tablet (40 mg total) by mouth 2 (two) times daily. 60 tablet 1   levalbuterol  (XOPENEX  HFA) 45 MCG/ACT inhaler Inhale 2 puffs into the lungs every 6 (six) hours as needed for wheezing. 45 g 0   predniSONE  (DELTASONE ) 20 MG tablet Take 2 tablets (40 mg total) by mouth daily. 8 tablet 0   spironolactone  (ALDACTONE ) 25 MG tablet Take 1/2 tablet (12.5 mg total) by mouth daily. 15 tablet 1   dapagliflozin  propanediol (FARXIGA ) 10 MG TABS tablet Take 1 tablet (10 mg total) by mouth daily. (Patient not taking: Reported on 02/08/2024) 90 tablet 1   pantoprazole  (PROTONIX ) 40 MG tablet Take 1 tablet (40 mg total) by mouth daily. (Patient not taking: Reported on 02/08/2024) 30 tablet 1   No current facility-administered medications for this encounter.    ROS- All systems are reviewed and negative except as per the HPI above  Physical Exam: Vitals:   03/11/24 0958  BP: 110/80  Pulse: 83  Weight: (!) 175.9 kg  Height: 6' (1.829 m)    Wt Readings from Last 3 Encounters:  03/11/24 (!) 175.9 kg  02/08/24 (!) 173.8 kg  02/08/24 (!) 174 kg    GEN: Well nourished, well developed in no acute distress NECK: No JVD CARDIAC: Irregularly irregular rate and rhythm, no murmurs, rubs, gallops RESPIRATORY:  Clear to auscultation without rales, wheezing or rhonchi  ABDOMEN: Soft, non-tender, non-distended EXTREMITIES:  2+ bilateral lower extremity edema, No deformity    EKG today  demonstrates Afib Vent. rate 83 BPM PR interval * ms QRS duration 74 ms QT/QTcB 380/446 ms   Echo 07/23/23  1. Left ventricular ejection fraction, by estimation, is 60 to 65%. The  left ventricle has normal function. The left ventricle has no regional  wall motion abnormalities. Left ventricular diastolic parameters were  normal.   2. Right ventricular systolic function is normal. The right ventricular  size is normal.   3. The mitral valve is normal in structure. No evidence of mitral valve  regurgitation. No evidence of mitral stenosis.   4. The aortic valve is tricuspid. There is mild calcification of the  aortic valve. Aortic valve regurgitation is not visualized. Aortic valve  sclerosis is present, with no evidence of aortic valve stenosis.   5. The inferior vena cava is normal in size with greater than 50%  respiratory variability, suggesting right atrial pressure  of 3 mmHg.    CHA2DS2-VASc Score = 4  The patient's score is based upon: CHF History: 1 HTN History: 1 Diabetes History: 0 Stroke History: 2 (TIA) Vascular Disease History: 0 Age Score: 0 Gender Score: 0       ASSESSMENT AND PLAN: Persistent Atrial Fibrillation (ICD10:  I48.19) The patient's CHA2DS2-VASc score is 4, indicating a 4.8% annual risk of stroke.   S/p DCCV 01/08/24 with quick return of afib after 2-3 minutes on dofetilide .  Dofetilide  discontinued and started on amiodarone .  Will arrange for DCCV now that he has loaded on amiodarone .  Check bmet/cbc Decrease amiodarone  to 200 mg daily Continue Eliquis  5 mg BID Continue carvedilol  25 mg BID  Secondary Hypercoagulable State (ICD10:  D68.69) The patient is at significant risk for stroke/thromboembolism based upon his CHA2DS2-VASc Score of 4.  Continue Apixaban  (Eliquis ). No bleeding issues.   High Risk Medication Monitoring (ICD 10: Z79.899) Intervals on ECG acceptable for amiodarone  monitoring.   HFrecEF EF 60-65%, suspected tachycardia  mediated.  Hopefully edema will improve with SR. Check bmet as above.  Continue Lasix  40 mg BID   Suspected OSA Patient has sleep consult on 7/17 at [facility].  Obesity Body mass index is 52.6 kg/m.  Encouraged lifestyle modification   Follow up in the AF clinic post DCCV.    Informed Consent   Shared Decision Making/Informed Consent The risks (stroke, cardiac arrhythmias rarely resulting in the need for a temporary or permanent pacemaker, skin irritation or burns and complications associated with conscious sedation including aspiration, arrhythmia, respiratory failure and death), benefits (restoration of normal sinus rhythm) and alternatives of a direct current cardioversion were explained in detail to Mr. Chavarin and he agrees to proceed.        Daril Kicks PA-C [facility] [address] [place], [place] [address] [phone]

## 2024-03-11 NOTE — Addendum Note (Signed)
 Encounter addended by: Janel Nancy SAUNDERS, RN on: 03/11/2024 10:46 AM  Actions taken: New alternative orders accepted, Order list changed, Diagnosis association updated

## 2024-03-11 NOTE — Patient Instructions (Addendum)
 Decrease amiodarone  to 200 mg once a day   Cardioversion scheduled for: Wednesday 03/12/24   - Arrive at the [facility] ([address])  and check in with ADMITTING at 10:30   - Do not eat or drink anything after midnight the night prior to your procedure.   - Take all your morning medication (except diabetic medications) with a sip of water prior to arrival.  - Do NOT miss any doses of your blood thinner - if you should miss a dose or take a dose more than 4 hours late -- please notify our office immediately.  - You will not be able to drive home after your procedure. Please ensure you have a responsible adult to drive you home. You will need someone with you for 24 hours post procedure.     - Expect to be in the procedural area approximately 2 hours.   - If you feel as if you go back into normal rhythm prior to scheduled cardioversion, please notify our office immediately.   If your procedure is canceled in the cardioversion suite you will be charged a cancellation fee.

## 2024-03-11 NOTE — Progress Notes (Signed)
 Spoke to patient and instructed them to come at 1030  and to be NPO after 0000.     Confirmed that patient will have a ride home and someone to stay with them for 24 hours after the procedure.  Confirmed blood thinner. Confirmed no breaks in taking blood thinner for 3+ weeks prior to procedure.

## 2024-03-12 ENCOUNTER — Other Ambulatory Visit: Payer: Self-pay

## 2024-03-12 ENCOUNTER — Other Ambulatory Visit: Payer: Self-pay | Admitting: Family Medicine

## 2024-03-12 ENCOUNTER — Other Ambulatory Visit: Payer: Self-pay | Admitting: Nurse Practitioner

## 2024-03-12 ENCOUNTER — Other Ambulatory Visit (HOSPITAL_COMMUNITY): Payer: Self-pay

## 2024-03-12 ENCOUNTER — Encounter (HOSPITAL_COMMUNITY): Payer: Self-pay

## 2024-03-12 ENCOUNTER — Ambulatory Visit (HOSPITAL_COMMUNITY)
Admission: RE | Admit: 2024-03-12 | Discharge: 2024-03-12 | Disposition: A | Discharge status: Home / Self Care | Attending: Cardiology | Admitting: Cardiology

## 2024-03-12 ENCOUNTER — Encounter (HOSPITAL_COMMUNITY)
Admission: RE | Disposition: A | Discharge status: Home / Self Care | Payer: Self-pay | Source: Home / Self Care | Attending: Cardiology

## 2024-03-12 ENCOUNTER — Ambulatory Visit (HOSPITAL_COMMUNITY): Admitting: Anesthesiology

## 2024-03-12 ENCOUNTER — Encounter (HOSPITAL_COMMUNITY): Payer: Self-pay | Admitting: Cardiology

## 2024-03-12 DIAGNOSIS — I4819 Other persistent atrial fibrillation: Secondary | ICD-10-CM

## 2024-03-12 DIAGNOSIS — I5020 Unspecified systolic (congestive) heart failure: Secondary | ICD-10-CM | POA: Diagnosis not present

## 2024-03-12 DIAGNOSIS — Z7901 Long term (current) use of anticoagulants: Secondary | ICD-10-CM | POA: Diagnosis not present

## 2024-03-12 DIAGNOSIS — D6869 Other thrombophilia: Secondary | ICD-10-CM | POA: Diagnosis not present

## 2024-03-12 DIAGNOSIS — I110 Hypertensive heart disease with heart failure: Secondary | ICD-10-CM

## 2024-03-12 DIAGNOSIS — I4891 Unspecified atrial fibrillation: Secondary | ICD-10-CM

## 2024-03-12 DIAGNOSIS — E782 Mixed hyperlipidemia: Secondary | ICD-10-CM | POA: Diagnosis not present

## 2024-03-12 DIAGNOSIS — I5022 Chronic systolic (congestive) heart failure: Secondary | ICD-10-CM | POA: Insufficient documentation

## 2024-03-12 DIAGNOSIS — Z6841 Body Mass Index (BMI) 40.0 and over, adult: Secondary | ICD-10-CM | POA: Diagnosis not present

## 2024-03-12 DIAGNOSIS — Z79899 Other long term (current) drug therapy: Secondary | ICD-10-CM | POA: Insufficient documentation

## 2024-03-12 HISTORY — PX: CARDIOVERSION: EP1203

## 2024-03-12 SURGERY — CARDIOVERSION (CATH LAB)
Anesthesia: Monitor Anesthesia Care

## 2024-03-12 MED ORDER — PROPOFOL 10 MG/ML IV BOLUS
INTRAVENOUS | Status: DC | PRN
Start: 2024-03-12 — End: 2024-03-12
  Administered 2024-03-12: 80 mg via INTRAVENOUS
  Administered 2024-03-12: 20 mg via INTRAVENOUS

## 2024-03-12 MED ORDER — SODIUM CHLORIDE 0.9% FLUSH: 3.0000 mL | INTRAVENOUS | Status: DC | PRN

## 2024-03-12 MED ORDER — BUDESONIDE-FORMOTEROL FUMARATE 160-4.5 MCG/ACT IN AERO: 2.0000 | INHALATION_SPRAY | Freq: Two times a day (BID) | RESPIRATORY_TRACT | Status: AC | PRN

## 2024-03-12 MED ORDER — LIDOCAINE 2% (20 MG/ML) 5 ML SYRINGE
INTRAMUSCULAR | Status: DC | PRN
  Administered 2024-03-12: 100 mg via INTRAVENOUS

## 2024-03-12 MED ORDER — SODIUM CHLORIDE 0.9% FLUSH: 3.0000 mL | Freq: Two times a day (BID) | INTRAVENOUS | Status: DC

## 2024-03-12 SURGICAL SUPPLY — 1 items: PAD DEFIB RADIO PHYSIO CONN (PAD) ×1 IMPLANT

## 2024-03-12 NOTE — Anesthesia Preprocedure Evaluation (Addendum)
 Anesthesia Evaluation  Patient identified by MRN, date of birth, ID band Patient awake    Reviewed: Allergy & Precautions, NPO status , Patient's Chart, lab work & pertinent test results, reviewed documented beta blocker date and time   History of Anesthesia Complications Negative for: history of anesthetic complications  Airway Mallampati: II  TM Distance: >3 FB     Dental no notable dental hx.    Pulmonary pneumonia, neg COPD   breath sounds clear to auscultation       Cardiovascular hypertension, +CHF  + dysrhythmias Atrial Fibrillation  Rhythm:Irregular Rate:Normal  IMPRESSIONS     1. Left ventricular ejection fraction, by estimation, is 60 to 65%. The  left ventricle has normal function. The left ventricle has no regional  wall motion abnormalities. Left ventricular diastolic parameters were  normal.   2. Right ventricular systolic function is normal. The right ventricular  size is normal.   3. The mitral valve is normal in structure. No evidence of mitral valve  regurgitation. No evidence of mitral stenosis.   4. The aortic valve is tricuspid. There is mild calcification of the  aortic valve. Aortic valve regurgitation is not visualized. Aortic valve  sclerosis is present, with no evidence of aortic valve stenosis.   5. The inferior vena cava is normal in size with greater than 50%  respiratory variability, suggesting right atrial pressure of 3 mmHg.     Neuro/Psych TIA   GI/Hepatic   Endo/Other    Renal/GU Renal InsufficiencyRenal disease     Musculoskeletal   Abdominal   Peds  Hematology   Anesthesia Other Findings   Reproductive/Obstetrics                              Anesthesia Physical Anesthesia Plan  ASA: 3  Anesthesia Plan: General   Post-op Pain Management:    Induction: Intravenous  PONV Risk Score and Plan: 1 and Propofol  infusion  Airway Management  Planned: Natural Airway and Nasal Cannula  Additional Equipment:   Intra-op Plan:   Post-operative Plan:   Informed Consent: I have reviewed the patients History and Physical, chart, labs and discussed the procedure including the risks, benefits and alternatives for the proposed anesthesia with the patient or authorized representative who has indicated his/her understanding and acceptance.     Dental advisory given  Plan Discussed with: CRNA  Anesthesia Plan Comments:         Anesthesia Quick Evaluation

## 2024-03-12 NOTE — Anesthesia Postprocedure Evaluation (Signed)
 Anesthesia Post Note  Patient: Anthony Delgado  Procedure(s) Performed: CARDIOVERSION     Patient location during evaluation: PACU Anesthesia Type: General Level of consciousness: awake and alert Pain management: pain level controlled Vital Signs Assessment: post-procedure vital signs reviewed and stable Respiratory status: spontaneous breathing, nonlabored ventilation, respiratory function stable and patient connected to nasal cannula oxygen Cardiovascular status: blood pressure returned to baseline and stable Postop Assessment: no apparent nausea or vomiting Anesthetic complications: no   No notable events documented.  Last Vitals:  Vitals:   03/12/24 1130 03/12/24 1140  BP: 108/72 110/72  Pulse: 66 69  Resp: 19 20  Temp:    SpO2: 100% 100%    Last Pain:  Vitals:   03/12/24 1128  TempSrc:   PainSc: 0-No pain                 [name]

## 2024-03-12 NOTE — Discharge Instructions (Signed)

## 2024-03-12 NOTE — Transfer of Care (Signed)
 Immediate Anesthesia Transfer of Care Note  Patient: Anthony Delgado  Procedure(s) Performed: CARDIOVERSION  Patient Location: PACU and Cath Lab  Anesthesia Type:General  Level of Consciousness: drowsy  Airway & Oxygen Therapy: Patient connected to face mask oxygen  Post-op Assessment: Report given to RN  Post vital signs: stable  Last Vitals:  Vitals Value Taken Time  BP 116/78 03/12/24 11:17  Temp    Pulse 76 03/12/24 11:21  Resp 22 03/12/24 11:21  SpO2 100 % 03/12/24 11:21    Last Pain:  Vitals:   03/12/24 1000  TempSrc: Temporal         Complications: No notable events documented.

## 2024-03-12 NOTE — CV Procedure (Signed)
    Electrical Cardioversion Procedure Note Anthony Delgado [id number] Nov 29, 1975  Procedure: Electrical Cardioversion Indications:  Atrial Fibrillation  Time Out: Verified patient identification, verified procedure,medications/allergies/relevent history reviewed, required imaging and test results available.  Performed  Procedure Details  During this procedure the patient is administered a total of Propofol  100 mg and Lidocaine  100 mg to achieve and maintain moderate conscious sedation.  The patient's heart rate, blood pressure, and oxygen saturation are monitored continuously during the procedure. The period of conscious sedation is 4 minutes, of which I was present face-to-face 100% of this time. Arboriculturist, CRNA is an independent, trained observer who assisted in the monitoring of the patient's level of consciousness.     Cardioversion was done with synchronized biphasic defibrillation with AP pads with 200watts. The patient failed to convert to NSR.  Cardioversion was done with synchronized biphasic defibrillation with AP pads with 360watts.  The patient converted to normal sinus rhythm. The patient tolerated the procedure well   IMPRESSION:  Successful cardioversion of atrial fibrillation    Andy Allende 03/12/2024, 10:20 AM

## 2024-03-12 NOTE — Interval H&P Note (Signed)
 History and Physical Interval Note:  03/12/2024 11:24 AM  Anthony Delgado  has presented today for surgery, with the diagnosis of AFIB.  The various methods of treatment have been discussed with the patient and family. After consideration of risks, benefits and other options for treatment, the patient has consented to  Procedure(s): CARDIOVERSION (N/A) as a surgical intervention.  The patient's history has been reviewed, patient examined, no change in status, stable for surgery.  I have reviewed the patient's chart and labs.  Questions were answered to the patient's satisfaction.     Wilbert Bihari

## 2024-03-13 ENCOUNTER — Other Ambulatory Visit (HOSPITAL_COMMUNITY): Payer: Self-pay

## 2024-03-13 ENCOUNTER — Encounter (HOSPITAL_COMMUNITY): Payer: Self-pay

## 2024-03-18 ENCOUNTER — Other Ambulatory Visit: Payer: Self-pay | Admitting: Family Medicine

## 2024-03-18 ENCOUNTER — Other Ambulatory Visit (HOSPITAL_COMMUNITY): Payer: Self-pay

## 2024-03-19 NOTE — Telephone Encounter (Signed)
 Medication refill being refused by Dr. Tanda because Dr. Tanda did not prescribe it. Pt needs to come in to have an appointment.  Called pt to let him know. LVM for pt to call back

## 2024-03-21 ENCOUNTER — Telehealth: Payer: Self-pay | Admitting: Cardiology

## 2024-03-21 ENCOUNTER — Other Ambulatory Visit (HOSPITAL_COMMUNITY): Payer: Self-pay

## 2024-03-21 ENCOUNTER — Ambulatory Visit (HOSPITAL_COMMUNITY)
Admission: RE | Admit: 2024-03-21 | Discharge: 2024-03-21 | Disposition: A | Discharge status: Home / Self Care | Source: Ambulatory Visit | Attending: Physician Assistant | Admitting: Physician Assistant

## 2024-03-21 VITALS — BP 144/52 | HR 70 | Wt 387.6 lb

## 2024-03-21 DIAGNOSIS — Z5181 Encounter for therapeutic drug level monitoring: Secondary | ICD-10-CM | POA: Diagnosis not present

## 2024-03-21 DIAGNOSIS — D6869 Other thrombophilia: Secondary | ICD-10-CM | POA: Diagnosis not present

## 2024-03-21 DIAGNOSIS — Z79899 Other long term (current) drug therapy: Secondary | ICD-10-CM | POA: Diagnosis not present

## 2024-03-21 DIAGNOSIS — I4819 Other persistent atrial fibrillation: Secondary | ICD-10-CM

## 2024-03-21 MED ORDER — FUROSEMIDE 40 MG PO TABS
40.0000 mg | ORAL_TABLET | Freq: Two times a day (BID) | ORAL | 3 refills | Status: DC
  Filled 2024-03-21: qty 60, 30d supply, fill #0
  Filled 2024-04-16: qty 60, 30d supply, fill #1
  Filled 2024-05-15: qty 60, 30d supply, fill #2
  Filled 2024-06-16: qty 60, 30d supply, fill #3

## 2024-03-21 MED ORDER — SPIRONOLACTONE 25 MG PO TABS
12.5000 mg | ORAL_TABLET | Freq: Every day | ORAL | 3 refills | Status: DC
  Filled 2024-03-21: qty 15, 30d supply, fill #0
  Filled 2024-04-16: qty 15, 30d supply, fill #1
  Filled 2024-05-15: qty 15, 30d supply, fill #2
  Filled 2024-06-16: qty 15, 30d supply, fill #3

## 2024-03-21 MED ORDER — AMIODARONE HCL 200 MG PO TABS
200.0000 mg | ORAL_TABLET | Freq: Every day | ORAL | 3 refills | Status: DC
  Filled 2024-03-21: qty 60, 60d supply, fill #0
  Filled 2024-04-06: qty 30, 30d supply, fill #0
  Filled 2024-05-15: qty 30, 30d supply, fill #1
  Filled 2024-06-10: qty 30, 30d supply, fill #2
  Filled 2024-07-10: qty 30, 30d supply, fill #3

## 2024-03-21 MED ORDER — APIXABAN 5 MG PO TABS
5.0000 mg | ORAL_TABLET | Freq: Two times a day (BID) | ORAL | 3 refills | Status: DC
  Filled 2024-03-21: qty 60, 30d supply, fill #0
  Filled 2024-04-16: qty 60, 30d supply, fill #1
  Filled 2024-05-15: qty 60, 30d supply, fill #2
  Filled 2024-06-16: qty 60, 30d supply, fill #3

## 2024-03-21 NOTE — Telephone Encounter (Signed)
 Patient was contacted by EP Scheduler to schedule an appointment w/ Dr. Inocencio for 8/21 to discuss an ablation, following patients 7/25 appointment with Anthony Kicks, Anthony Delgado.   Are you calling in reference to your FMLA or disability form? FMLA   What is your question in regards to FMLA or disability form? While on the phone with the patient, he inquired about FMLA papers from his 7/16 DCCV. He states he needs the FMLA papers prior to 8/6, and cannot wait until his 8/21 appt w/ Dr. Inocencio.    Do you need copies of your medical records? No    Are you waiting on a nurse to call you back with results or are you wanting copies of your results? No     Please route to Medical Records or your medical records site representative

## 2024-03-21 NOTE — Telephone Encounter (Signed)
 Spoke with pt regarding his FMLA paperwork. Pt stated he has not yet dropped off the paperwork. Pt was told to bring in the paperwork to be given to Dr. Inocencio. Pt stated he needs the paperwork completed by 8/6. Pt was told this information would be sent to Dr. Inocencio and his nurse. Pt verbalized understanding. All questions if any were answered.

## 2024-03-21 NOTE — Progress Notes (Signed)
 Primary Care Physician: Tanda Bleacher, MD Referring Physician: [facility] f/u  Cardiologist: Dr. Pietro  Primary EP: Dr Inocencio Madeleine Gearing is a 48 y.o. male with a h/o  morbid obesity, HTN, ran out of his medication about 3 months ago due to PCP leaving and closing his practice and patient having difficulty establishing care. Presented to [facility] ED with worsening bilateral lower extremity edema, dyspnea, chest tightness.  He was found to be in A-fib with RVR, significantly fluid overloaded and was admitted to the hospital.  He was placed on diltiazem  infusion, Eliquis  and cardiology was consulted. His weight is usually in the 330s, it was as high as 357 on admission.    He was found to have HFrEF exacerbation and new onset afib with RVR.  He had  TEE cardioversion, but remained in atrial fibrillation for just a few beats with each shock. TEE showed severely reduced EF at 30% with moderate to severe tricuspid regurgitation, rt atrium severely dilated, mild MR.  He was  improved after diuresis and rate control.  Plan was  for outpatient follow up with cardiology for further management.  He is being seen in afib clinic today. He remains in rate controlled afib. He reports feeling much better, not aware of afib at this point. . Weight is stable, no unusual shortness of breath. He noted symptoms for about one month prior to presenting to the ED. He has been told that he snores teribly with apnea noted. He was put on cpap in hospital. He does not smoke, sue drugs or drink alcohol. 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   He is being compliant with meds. No missed eliquis  5 mg bid, he has been on drug for around a week started 05/29/22 prior to TEE which was negative for thrombus.  F/u in afib clinic, 06/20/22. He is here for Tikosyn  admit. He remains in rate controlled afib. Normovolemic. No missed anticoagulation or benadryl  use. His needs  were reviewed by PharmD and no contraindicated drugs on board. Qtc  is acceptable for tikosyn  use.  F/u in afib clinic, 08/29/21,  one week after Tikosyn  load. He did return to SR with cardioversion but ERAF. The plan was if he still had afib on return to clinic, to cardiovert again. Despite being in afib, he feels great. No missed anticoagulation.   Follow up 02/08/24. Patient returns for follow up for atrial fibrillation. Patient is s/p DCCV 01/08/24 but reverted back to afib after ~2-3 minutes. He does not have tachypalpitations but does note increased lower extremity edema when in afib. His edema responds well to Lasix . No bleeding issues on anticoagulation.   Follow up 03/03/24. Patient returns for follow up for atrial fibrillation and amiodarone  monitoring. He is s/p DCCV on 03/12/24 which was initially successful but he has had early return of afib. His SOB and lower extremity edema did improve while in SR. No bleeding issues on anticoagulation.   Today, he  denies symptoms of palpitations, chest pain, orthopnea, PND, dizziness, presyncope, syncope, bleeding, or neurologic sequela. The patient is tolerating medications without difficulties and is otherwise without complaint today.    Past Medical History:  Diagnosis Date   A-fib [facility])    Abnormal LFTs 07/06/2012   Cellulitis and abscess of trunk 07/06/2012   Congestive heart failure (CHF) (HCC)    Diarrhea 07/06/2012   Encephalopathy acute 07/06/2012   Hypertension  Hyponatremia 07/06/2012   Nausea & vomiting 07/06/2012   Staph aureus infection 07/06/2012   TIA (transient ischemic attack)    TIA (transient ischemic attack) 04/02/2013    Current Outpatient Medications  Medication Sig Dispense Refill   amiodarone  (PACERONE ) 200 MG tablet Take 1 tablet (200 mg total) by mouth daily. 60 tablet 3   apixaban  (ELIQUIS ) 5 MG TABS tablet Take 1 tablet (5 mg total) by mouth 2 (two) times daily. 60 tablet 0   budesonide -formoterol  (SYMBICORT )  160-4.5 MCG/ACT inhaler Inhale 2 puffs into the lungs 2 (two) times daily as needed (Shortness of breath).     carvedilol  (COREG ) 25 MG tablet Take 1 tablet (25 mg total) by mouth 2 (two) times daily. 180 tablet 3   furosemide  (LASIX ) 40 MG tablet Take 1 tablet (40 mg total) by mouth 2 (two) times daily. 60 tablet 1   levalbuterol  (XOPENEX  HFA) 45 MCG/ACT inhaler Inhale 2 puffs into the lungs every 6 (six) hours as needed for wheezing. 45 g 0   spironolactone  (ALDACTONE ) 25 MG tablet Take 1/2 tablet (12.5 mg total) by mouth daily. 15 tablet 1   No current facility-administered medications for this encounter.    ROS- All systems are reviewed and negative except as per the HPI above  Physical Exam: Vitals:   03/21/24 1356  BP: (!) 144/52  Pulse: 70  Weight: (!) 175.8 kg    Wt Readings from Last 3 Encounters:  03/21/24 (!) 175.8 kg  03/12/24 (!) 174.6 kg  03/11/24 (!) 175.9 kg    GEN: Well nourished, well developed in no acute distress NECK: No JVD CARDIAC: Irregularly irregular rate and rhythm, no murmurs, rubs, gallops RESPIRATORY:  Clear to auscultation without rales, wheezing or rhonchi  ABDOMEN: Soft, non-tender, non-distended EXTREMITIES:  1-2+ bilateral lower extremity edema R>L, chronic stasis, No deformity    EKG today demonstrates Afib Vent. rate 70 BPM PR interval * ms QRS duration 72 ms QT/QTcB 408/440 ms   Echo 07/23/23  1. Left ventricular ejection fraction, by estimation, is 60 to 65%. The  left ventricle has normal function. The left ventricle has no regional  wall motion abnormalities. Left ventricular diastolic parameters were  normal.   2. Right ventricular systolic function is normal. The right ventricular  size is normal.   3. The mitral valve is normal in structure. No evidence of mitral valve  regurgitation. No evidence of mitral stenosis.   4. The aortic valve is tricuspid. There is mild calcification of the  aortic valve. Aortic valve  regurgitation is not visualized. Aortic valve  sclerosis is present, with no evidence of aortic valve stenosis.   5. The inferior vena cava is normal in size with greater than 50%  respiratory variability, suggesting right atrial pressure of 3 mmHg.    CHA2DS2-VASc Score = 4  The patient's score is based upon: CHF History: 1 HTN History: 1 Diabetes History: 0 Stroke History: 2 (TIA) Vascular Disease History: 0 Age Score: 0 Gender Score: 0       ASSESSMENT AND PLAN: Persistent Atrial Fibrillation (ICD10:  I48.19) The patient's CHA2DS2-VASc score is 4, indicating a 4.8% annual risk of stroke.   S/p DCCV 01/08/24 with quick return of afib after 2-3 minutes on dofetilide .  Dofetilide  discontinued and started on amiodarone .  S/p DCCV 03/12/24 with quick return of afib. Symptomatic despite good rate control.  His rhythm control options are limited, previously failed dofetilide , has failed to maintain SR on amiodarone . BMI is prohibitive for ablation. ?  If he would be a candidate for surgical ablation, will refer back to Dr Inocencio to discuss options. He does feel much better in SR.  Continue amiodarone  200 mg daily for now Continue Eliquis  5 mg BID Continue carvedilol  25 mg BID  Secondary Hypercoagulable State (ICD10:  D68.69) The patient is at significant risk for stroke/thromboembolism based upon his CHA2DS2-VASc Score of 4.  Continue Apixaban  (Eliquis ). No bleeding issues.   High Risk Medication Monitoring (ICD 10: Z79.899) Intervals on ECG acceptable for amiodarone  monitoring.   HFrecEF EF 60-65%, suspected tachycardia mediated.  Continue Lasix  and spironolactone     Suspected OSA Referred for sleep study at previous visit.   Obesity Body mass index is 52.57 kg/m.  Encouraged lifestyle modification Contributing to the persistence of his arrhythmia.    Follow up with Dr Inocencio to discuss rhythm options.         Daril Kicks PA-C Afib Clinic [facility] [address] [place], [place] [address] [phone]

## 2024-03-24 NOTE — Telephone Encounter (Addendum)
 Called pt and informed that I can do my best to have Dr. Inocencio sign needed FMLA paperwork next Tuesday, but he would need to get it to our office ASAP.  Explained that typically protocol allows up to 2 weeks for MD to sign once office receives, but will do my best to get it signed when he is the office on 8/5.  Patient appreciates our efforts to get it back to him asap.

## 2024-03-25 NOTE — Telephone Encounter (Signed)
 Pt dropped off FMLA paperwork for Dr. Inocencio.  Original copies of paperwork left in Dr. Inocencio' mailbox. Copies for Leanne left in MR room on 1st floor.   JB, 03-25-24

## 2024-03-28 NOTE — Telephone Encounter (Signed)
 Left VM for patient that FMLA forms were completed. Made him awarehe needs to come in to fill our ROI, Billing sheet, and pay his $29 fee in form of cash, check or money order.

## 2024-03-31 ENCOUNTER — Inpatient Hospital Stay: Admitting: Family Medicine

## 2024-04-03 DIAGNOSIS — Z0279 Encounter for issue of other medical certificate: Secondary | ICD-10-CM

## 2024-04-03 NOTE — Telephone Encounter (Signed)
 Completed Provider Statement form faxed to [facility] and scanned into chart.  Billing notified.

## 2024-04-07 ENCOUNTER — Other Ambulatory Visit (HOSPITAL_COMMUNITY): Payer: Self-pay

## 2024-04-17 ENCOUNTER — Encounter: Payer: Self-pay | Admitting: Cardiology

## 2024-04-17 ENCOUNTER — Ambulatory Visit: Attending: Cardiology | Admitting: Cardiology

## 2024-04-17 VITALS — BP 154/100 | HR 78 | Ht 72.0 in | Wt 388.1 lb

## 2024-04-17 DIAGNOSIS — D6869 Other thrombophilia: Secondary | ICD-10-CM | POA: Diagnosis not present

## 2024-04-17 DIAGNOSIS — Z79899 Other long term (current) drug therapy: Secondary | ICD-10-CM

## 2024-04-17 DIAGNOSIS — I4891 Unspecified atrial fibrillation: Secondary | ICD-10-CM | POA: Diagnosis not present

## 2024-04-17 DIAGNOSIS — Z01812 Encounter for preprocedural laboratory examination: Secondary | ICD-10-CM

## 2024-04-17 LAB — BASIC METABOLIC PANEL WITH GFR
BUN/Creatinine Ratio: 6 — ABNORMAL LOW (ref 9–20)
BUN: 7 mg/dL (ref 6–24)
CO2: 18 mmol/L — ABNORMAL LOW (ref 20–29)
Calcium: 8.5 mg/dL — ABNORMAL LOW (ref 8.7–10.2)
Chloride: 102 mmol/L (ref 96–106)
Creatinine, Ser: 1.26 mg/dL (ref 0.76–1.27)
Glucose: 86 mg/dL (ref 70–99)
Potassium: 4.6 mmol/L (ref 3.5–5.2)
Sodium: 138 mmol/L (ref 134–144)
eGFR: 69 mL/min/1.73 (ref >59)

## 2024-04-17 LAB — CBC
Hematocrit: 47.9 % (ref 37.5–51.0)
Hemoglobin: 16.2 g/dL (ref 13.0–17.7)
MCH: 31.3 pg (ref 26.6–33.0)
MCHC: 33.8 g/dL (ref 31.5–35.7)
MCV: 93 fL (ref 79–97)
Platelets: 241 x10E3/uL (ref 150–450)
RBC: 5.17 x10E6/uL (ref 4.14–5.80)
RDW: 13.9 % (ref 11.6–15.4)
WBC: 5.1 x10E3/uL (ref 3.4–10.8)

## 2024-04-17 NOTE — Progress Notes (Signed)
 Electrophysiology Office Note:   Date:  04/17/2024  ID:  Anthony Delgado, DOB May 07, 1976, MRN [id number]  Primary Cardiologist: Redell Shallow, MD Primary Heart Failure: None Electrophysiologist: Mairyn Lenahan Gladis Norton, MD      History of Present Illness:   Anthony Delgado is a 48 y.o. male with h/o morbid obesity, hypertension, atrial fibrillation seen today for routine electrophysiology followup.   Anthony Delgado is currently on amiodarone .  Anthony Delgado had cardioversion 03/12/2024 which was initially successful but unfortunately had early return of atrial fibrillation.  Anthony Delgado has shortness of breath and lower extremity edema that did improve when Anthony Delgado was in sinus rhythm.  Since last being seen in our clinic Anthony Delgado reports continued episodes of atrial fibrillation.  When Anthony Delgado was in sinus rhythm Anthony Delgado felt well.  Now Anthony Delgado has significant lower extremity edema and significant discomfort in his legs.  Anthony Delgado also has weakness, fatigue, shortness of breath.  Anthony Delgado did not have any of these issues initially after his cardioversion.  Anthony Delgado denies chest pain, palpitations, PND, orthopnea, nausea, vomiting, dizziness, syncope, or early satiety.   Review of systems complete and found to be negative unless listed in HPI.   EP Information / Studies Reviewed:    EKG is not ordered today. EKG from 03/21/2024 reviewed which showed atrial fibrillation      Risk Assessment/Calculations:    CHA2DS2-VASc Score = 4   This indicates a 4.8% annual risk of stroke. Anthony Delgado's score is based upon: CHF History: 1 HTN History: 1 Diabetes History: 0 Stroke History: 2 (TIA) Vascular Disease History: 0 Age Score: 0 Gender Score: 0            Physical Exam:   VS:  There were no vitals taken for this visit.   Wt Readings from Last 3 Encounters:  03/21/24 (!) 387 lb 9.6 oz (175.8 kg)  03/12/24 (!) 385 lb (174.6 kg)  03/11/24 (!) 387 lb 12.8 oz (175.9 kg)     GEN: Well nourished, well developed in no acute distress NECK: No JVD; No  carotid bruits CARDIAC: Irregularly irregular rate and rhythm, no murmurs, rubs, gallops RESPIRATORY:  Clear to auscultation without rales, wheezing or rhonchi  ABDOMEN: Soft, non-tender, non-distended EXTREMITIES:  No edema; No deformity   ASSESSMENT AND PLAN:    1.  Persistent atrial fibrillation: On amiodarone , Eliquis , carvedilol .  Anthony Delgado is unfortunately continued to have episodes of atrial fibrillation despite optimal medications.  Anthony Delgado is tolerating amiodarone  but this is not keeping him in normal rhythm.  Anthony Delgado would benefit from ablation.  Risks and benefits have been discussed.  Anthony Delgado understands Anthony risks and has agreed to Anthony procedure.  Risk, benefits, and alternatives to EP study and radiofrequency/pulse field ablation for afib were also discussed in detail today. These risks include but are not limited to stroke, bleeding, vascular damage, tamponade, perforation, damage to Anthony esophagus, lungs, and other structures, pulmonary vein stenosis, worsening renal function, and death. Anthony Delgado understands these risk and wishes to proceed.  We Eldon Zietlow therefore proceed with catheter ablation at Anthony next available time.  Carto, ICE, anesthesia are requested for Anthony procedure.  This Delgado Rockie Schnoor NOT require CT prior to ablation  2.  Secondary hypercoagulable state: On Eliquis   3.  High-risk medication monitoring: On amiodarone .  Recent labs within normal limits f  4. Chronic systolic heart failure: Suspect tachycardia mediated.  Ejection fraction has normalized.  5.  Suspected sleep apnea: Has been referred for sleep study  6.  Obesity: Lifestyle modification encouraged.  We discussed Anthony significant improvements that Anthony Delgado would likely have with weight loss.  Anthony Delgado has gained a significant amount of weight since Anthony Delgado has been dealing with his atrial fibrillation.  Follow up with Afib Clinic as usual post procedure  Signed, Nakina Spatz Gladis Norton, MD

## 2024-04-17 NOTE — Patient Instructions (Addendum)
 Medication Instructions:  Your physician recommends that you continue on your current medications as directed. Please refer to the Current Medication list given to you today.  *If you need a refill on your cardiac medications before your next appointment, please call your pharmacy*  Lab Work: BMP amd CBC -- today  You may go to any [facility] Location for your lab work:  [facility] - [address] ([facility]) - [address] [phone] [address]  [place] - [address]   [place]  - [address]   [place] - [address] - [address] [employer]  - [address] [place] - [address] ([facility]   If you have labs (blood work) drawn today and your tests are completely normal, you will receive your results only by: [employer] (if you have MyChart)  If you have any lab test that is abnormal or we need to change your treatment, we will call you or send a MyChart message to review the results.  Testing/Procedures: None ordered.  Follow-Up: At [facility], you and your health needs are our priority.  As part of our continuing mission to provide you with exceptional heart care, we have created designated Provider Care Teams.  These Care Teams include your primary Cardiologist (physician) and Advanced Practice Providers (APPs -  Physician Assistants and Nurse Practitioners) who all work together to provide you with the care you need, when you need it.  We recommend signing up for the patient portal called MyChart.  Sign up information is provided on this After Visit Summary.  MyChart is used to connect with patients for Virtual Visits (Telemedicine).  Patients are able to view lab/test results, encounter notes, upcoming appointments, etc.  Non-urgent messages can be sent to your provider as well.   To learn more about what you can do with MyChart, go to  [email or web address].    Your next appointment:   Afib ablation on August 26th

## 2024-04-17 NOTE — H&P (View-Only) (Signed)
 Electrophysiology Office Note:   Date:  04/17/2024  ID:  Anthony Delgado, DOB May 07, 1976, MRN [id number]  Primary Cardiologist: Redell Shallow, MD Primary Heart Failure: None Electrophysiologist: Mairyn Lenahan Gladis Norton, MD      History of Present Illness:   Anthony Delgado is a 48 y.o. male with h/o morbid obesity, hypertension, atrial fibrillation seen today for routine electrophysiology followup.   He is currently on amiodarone .  He had cardioversion 03/12/2024 which was initially successful but unfortunately had early return of atrial fibrillation.  He has shortness of breath and lower extremity edema that did improve when he was in sinus rhythm.  Since last being seen in our clinic the patient reports continued episodes of atrial fibrillation.  When he was in sinus rhythm he felt well.  Now he has significant lower extremity edema and significant discomfort in his legs.  He also has weakness, fatigue, shortness of breath.  He did not have any of these issues initially after his cardioversion.  he denies chest pain, palpitations, PND, orthopnea, nausea, vomiting, dizziness, syncope, or early satiety.   Review of systems complete and found to be negative unless listed in HPI.   EP Information / Studies Reviewed:    EKG is not ordered today. EKG from 03/21/2024 reviewed which showed atrial fibrillation      Risk Assessment/Calculations:    CHA2DS2-VASc Score = 4   This indicates a 4.8% annual risk of stroke. The patient's score is based upon: CHF History: 1 HTN History: 1 Diabetes History: 0 Stroke History: 2 (TIA) Vascular Disease History: 0 Age Score: 0 Gender Score: 0            Physical Exam:   VS:  There were no vitals taken for this visit.   Wt Readings from Last 3 Encounters:  03/21/24 (!) 387 lb 9.6 oz (175.8 kg)  03/12/24 (!) 385 lb (174.6 kg)  03/11/24 (!) 387 lb 12.8 oz (175.9 kg)     GEN: Well nourished, well developed in no acute distress NECK: No JVD; No  carotid bruits CARDIAC: Irregularly irregular rate and rhythm, no murmurs, rubs, gallops RESPIRATORY:  Clear to auscultation without rales, wheezing or rhonchi  ABDOMEN: Soft, non-tender, non-distended EXTREMITIES:  No edema; No deformity   ASSESSMENT AND PLAN:    1.  Persistent atrial fibrillation: On amiodarone , Eliquis , carvedilol .  He is unfortunately continued to have episodes of atrial fibrillation despite optimal medications.  He is tolerating amiodarone  but this is not keeping him in normal rhythm.  He would benefit from ablation.  Risks and benefits have been discussed.  He understands the risks and has agreed to the procedure.  Risk, benefits, and alternatives to EP study and radiofrequency/pulse field ablation for afib were also discussed in detail today. These risks include but are not limited to stroke, bleeding, vascular damage, tamponade, perforation, damage to the esophagus, lungs, and other structures, pulmonary vein stenosis, worsening renal function, and death. The patient understands these risk and wishes to proceed.  We Anthony Delgado therefore proceed with catheter ablation at the next available time.  Carto, ICE, anesthesia are requested for the procedure.  This patient Anthony Delgado NOT require CT prior to ablation  2.  Secondary hypercoagulable state: On Eliquis   3.  High-risk medication monitoring: On amiodarone .  Recent labs within normal limits f  4. Chronic systolic heart failure: Suspect tachycardia mediated.  Ejection fraction has normalized.  5.  Suspected sleep apnea: Has been referred for sleep study  6.  Obesity: Lifestyle modification encouraged.  We discussed the significant improvements that he would likely have with weight loss.  He has gained a significant amount of weight since he has been dealing with his atrial fibrillation.  Follow up with Afib Clinic as usual post procedure  Signed, Nakina Spatz Gladis Norton, MD

## 2024-04-21 NOTE — Pre-Procedure Instructions (Signed)
 Attempted to call patient regarding procedure instructions.  Left voicemail on the following items: Arrival time 0515 Nothing to eat or drink after midnight No meds AM of procedure Responsible person to drive you home and stay with you for 24 hrs  Have you missed any doses of anti-coagulant Eliquis- should be taken twice a day, if you have missed any doses please let us know.  Don't take dose morning of procedure.

## 2024-04-22 ENCOUNTER — Ambulatory Visit (HOSPITAL_COMMUNITY)
Admission: RE | Admit: 2024-04-22 | Discharge: 2024-04-22 | Disposition: A | Discharge status: Home / Self Care | Attending: Cardiology | Admitting: Cardiology

## 2024-04-22 ENCOUNTER — Ambulatory Visit (HOSPITAL_COMMUNITY): Admitting: Registered Nurse

## 2024-04-22 ENCOUNTER — Other Ambulatory Visit: Payer: Self-pay

## 2024-04-22 ENCOUNTER — Ambulatory Visit (HOSPITAL_COMMUNITY)
Admission: RE | Disposition: A | Discharge status: Home / Self Care | Payer: Self-pay | Source: Home / Self Care | Attending: Cardiology

## 2024-04-22 DIAGNOSIS — I110 Hypertensive heart disease with heart failure: Secondary | ICD-10-CM | POA: Diagnosis not present

## 2024-04-22 DIAGNOSIS — I4891 Unspecified atrial fibrillation: Secondary | ICD-10-CM

## 2024-04-22 DIAGNOSIS — I5022 Chronic systolic (congestive) heart failure: Secondary | ICD-10-CM | POA: Diagnosis not present

## 2024-04-22 DIAGNOSIS — Z8673 Personal history of transient ischemic attack (TIA), and cerebral infarction without residual deficits: Secondary | ICD-10-CM | POA: Diagnosis not present

## 2024-04-22 DIAGNOSIS — I4819 Other persistent atrial fibrillation: Secondary | ICD-10-CM

## 2024-04-22 DIAGNOSIS — D6869 Other thrombophilia: Secondary | ICD-10-CM | POA: Insufficient documentation

## 2024-04-22 DIAGNOSIS — Z6841 Body Mass Index (BMI) 40.0 and over, adult: Secondary | ICD-10-CM | POA: Insufficient documentation

## 2024-04-22 DIAGNOSIS — I5020 Unspecified systolic (congestive) heart failure: Secondary | ICD-10-CM | POA: Diagnosis not present

## 2024-04-22 DIAGNOSIS — I483 Typical atrial flutter: Secondary | ICD-10-CM | POA: Diagnosis not present

## 2024-04-22 DIAGNOSIS — Z7901 Long term (current) use of anticoagulants: Secondary | ICD-10-CM | POA: Insufficient documentation

## 2024-04-22 DIAGNOSIS — E782 Mixed hyperlipidemia: Secondary | ICD-10-CM

## 2024-04-22 DIAGNOSIS — Z79899 Other long term (current) drug therapy: Secondary | ICD-10-CM | POA: Diagnosis not present

## 2024-04-22 HISTORY — PX: ATRIAL FIBRILLATION ABLATION: EP1191

## 2024-04-22 LAB — POCT ACTIVATED CLOTTING TIME: Activated Clotting Time: 279 s

## 2024-04-22 MED ORDER — ONDANSETRON HCL 4 MG/2ML IJ SOLN
INTRAMUSCULAR | Status: DC | PRN
  Administered 2024-04-22: 4 mg via INTRAVENOUS

## 2024-04-22 MED ORDER — LIDOCAINE 2% (20 MG/ML) 5 ML SYRINGE
INTRAMUSCULAR | Status: DC | PRN
  Administered 2024-04-22: 100 mg via INTRAVENOUS

## 2024-04-22 MED ORDER — HEPARIN SODIUM (PORCINE) 1000 UNIT/ML IJ SOLN
INTRAMUSCULAR | Status: DC | PRN
  Administered 2024-04-22: 6000 [IU] via INTRAVENOUS
  Administered 2024-04-22: 16000 [IU] via INTRAVENOUS

## 2024-04-22 MED ORDER — ONDANSETRON HCL 4 MG/2ML IJ SOLN: 4.0000 mg | Freq: Four times a day (QID) | INTRAMUSCULAR | Status: DC | PRN

## 2024-04-22 MED ORDER — DEXMEDETOMIDINE HCL IN NACL 80 MCG/20ML IV SOLN
INTRAVENOUS | Status: DC | PRN
  Administered 2024-04-22: 10 ug via INTRAVENOUS

## 2024-04-22 MED ORDER — PHENYLEPHRINE HCL-NACL 20-0.9 MG/250ML-% IV SOLN
INTRAVENOUS | Status: DC | PRN
  Administered 2024-04-22: 50 ug/min via INTRAVENOUS

## 2024-04-22 MED ORDER — PROPOFOL 10 MG/ML IV BOLUS
INTRAVENOUS | Status: DC | PRN
Start: 2024-04-22 — End: 2024-04-22
  Administered 2024-04-22: 300 mg via INTRAVENOUS

## 2024-04-22 MED ORDER — ROCURONIUM BROMIDE 100 MG/10ML IV SOLN
INTRAVENOUS | Status: DC | PRN
  Administered 2024-04-22 (×2): 20 mg via INTRAVENOUS
  Administered 2024-04-22: 60 mg via INTRAVENOUS

## 2024-04-22 MED ORDER — PROTAMINE SULFATE 10 MG/ML IV SOLN
INTRAVENOUS | Status: DC | PRN
  Administered 2024-04-22: 10 mg via INTRAVENOUS
  Administered 2024-04-22: 30 mg via INTRAVENOUS
  Administered 2024-04-22: 20 mg via INTRAVENOUS

## 2024-04-22 MED ORDER — PHENYLEPHRINE 80 MCG/ML (10ML) SYRINGE FOR IV PUSH (FOR BLOOD PRESSURE SUPPORT)
PREFILLED_SYRINGE | INTRAVENOUS | Status: DC | PRN
  Administered 2024-04-22: 120 ug via INTRAVENOUS

## 2024-04-22 MED ORDER — GLYCOPYRROLATE PF 0.2 MG/ML IJ SOSY
PREFILLED_SYRINGE | INTRAMUSCULAR | Status: DC | PRN
Start: 2024-04-22 — End: 2024-04-22
  Administered 2024-04-22: .1 mg via INTRAVENOUS

## 2024-04-22 MED ORDER — ATROPINE SULFATE 1 MG/10ML IJ SOSY
PREFILLED_SYRINGE | INTRAMUSCULAR | Status: DC | PRN
  Administered 2024-04-22: 1 mg via INTRAVENOUS

## 2024-04-22 MED ORDER — SODIUM CHLORIDE 0.9 % IV SOLN: INTRAVENOUS | Status: DC

## 2024-04-22 MED ORDER — SUCCINYLCHOLINE CHLORIDE 200 MG/10ML IV SOSY
PREFILLED_SYRINGE | INTRAVENOUS | Status: DC | PRN
  Administered 2024-04-22: 200 mg via INTRAVENOUS

## 2024-04-22 MED ORDER — FENTANYL CITRATE (PF) 250 MCG/5ML IJ SOLN
INTRAMUSCULAR | Status: DC | PRN
  Administered 2024-04-22: 50 ug via INTRAVENOUS

## 2024-04-22 MED ORDER — HEPARIN (PORCINE) IN NACL 1000-0.9 UT/500ML-% IV SOLN
INTRAVENOUS | Status: DC | PRN
Start: 2024-04-22 — End: 2024-04-22
  Administered 2024-04-22 (×3): 500 mL

## 2024-04-22 MED ORDER — SODIUM CHLORIDE 0.9% FLUSH: 3.0000 mL | Freq: Two times a day (BID) | INTRAVENOUS | Status: DC

## 2024-04-22 MED ORDER — SUGAMMADEX SODIUM 200 MG/2ML IV SOLN
INTRAVENOUS | Status: DC | PRN
  Administered 2024-04-22: 600 mg via INTRAVENOUS

## 2024-04-22 MED ORDER — SUGAMMADEX SODIUM 200 MG/2ML IV SOLN: INTRAVENOUS | Status: DC | PRN

## 2024-04-22 MED ORDER — ACETAMINOPHEN 325 MG PO TABS: 650.0000 mg | ORAL_TABLET | ORAL | Status: DC | PRN

## 2024-04-22 MED ORDER — SODIUM CHLORIDE 0.9 % IV SOLN: 250.0000 mL | INTRAVENOUS | Status: DC | PRN

## 2024-04-22 MED ORDER — SODIUM CHLORIDE 0.9% FLUSH: 3.0000 mL | INTRAVENOUS | Status: DC | PRN

## 2024-04-22 MED ORDER — DEXAMETHASONE SODIUM PHOSPHATE 10 MG/ML IJ SOLN
INTRAMUSCULAR | Status: DC | PRN
Start: 2024-04-22 — End: 2024-04-22
  Administered 2024-04-22: 5 mg via INTRAVENOUS

## 2024-04-22 MED ORDER — FENTANYL CITRATE (PF) 100 MCG/2ML IJ SOLN
INTRAMUSCULAR | Status: AC
  Filled 2024-04-22: qty 2

## 2024-04-22 NOTE — Anesthesia Procedure Notes (Addendum)
 Procedure Name: Intubation Date/Time: 04/22/2024 7:55 AM  Performed by: Virgil Ee, CRNAPre-anesthesia Checklist: Patient identified, Patient being monitored, Timeout performed, Emergency Drugs available and Suction available Patient Re-evaluated:Patient Re-evaluated prior to induction Oxygen Delivery Method: Circle system utilized Preoxygenation: Pre-oxygenation with 100% oxygen Induction Type: IV induction Laryngoscope Size: Glidescope and 4 Grade View: Grade I Tube type: Oral Tube size: 8.0 mm Number of attempts: 1 Airway Equipment and Method: Stylet Placement Confirmation: ETT inserted through vocal cords under direct vision, positive ETCO2 and breath sounds checked- equal and bilateral Secured at: 24 cm Tube secured with: Tape Dental Injury: Teeth and Oropharynx as per pre-operative assessment  Difficulty Due To: Difficulty was anticipated, Difficult Airway- due to large tongue and Difficult Airway- due to reduced neck mobility

## 2024-04-22 NOTE — Interval H&P Note (Signed)
 History and Physical Interval Note:  04/22/2024 7:07 AM  Anthony Delgado  has presented today for surgery, with the diagnosis of afib.  The various methods of treatment have been discussed with the patient and family. After consideration of risks, benefits and other options for treatment, the patient has consented to  Procedure(s): ATRIAL FIBRILLATION ABLATION (N/A) as a surgical intervention.  The patient's history has been reviewed, patient examined, no change in status, stable for surgery.  I have reviewed the patient's chart and labs.  Questions were answered to the patient's satisfaction.     Izyk Marty [employer]

## 2024-04-22 NOTE — Anesthesia Preprocedure Evaluation (Signed)
 Anesthesia Evaluation  Patient identified by MRN, date of birth, ID band Patient awake    Reviewed: Allergy & Precautions, NPO status , Patient's Chart, lab work & pertinent test results  Airway Mallampati: III  TM Distance: >3 FB Neck ROM: Full    Dental no notable dental hx.    Pulmonary neg pulmonary ROS   Pulmonary exam normal        Cardiovascular hypertension, Pt. on medications and Pt. on home beta blockers +CHF  + dysrhythmias Atrial Fibrillation  Rhythm:Regular Rate:Normal     Neuro/Psych TIA negative psych ROS   GI/Hepatic negative GI ROS, Neg liver ROS,,,  Endo/Other  negative endocrine ROS    Renal/GU   negative genitourinary   Musculoskeletal negative musculoskeletal ROS (+)    Abdominal  (+) + obese  Peds  Hematology Lab Results      Component                Value               Date                      WBC                      5.1                 04/17/2024                HGB                      16.2                04/17/2024                HCT                      47.9                04/17/2024                MCV                      93                  04/17/2024                PLT                      241                 04/17/2024             Lab Results      Component                Value               Date                      NA                       138                 04/17/2024                K  4.6                 04/17/2024                CO2                      18 (L)              04/17/2024                GLUCOSE                  86                  04/17/2024                BUN                      7                   04/17/2024                CREATININE               1.26                04/17/2024                CALCIUM                  8.5 (L)             04/17/2024                EGFR                     69                  04/17/2024                 GFRNONAA                 >60                 01/23/2024              Anesthesia Other Findings   Reproductive/Obstetrics                              Anesthesia Physical Anesthesia Plan  ASA: 3  Anesthesia Plan: General   Post-op Pain Management:    Induction: Intravenous  PONV Risk Score and Plan: 2 and Ondansetron , Dexamethasone , Treatment may vary due to age or medical condition and Midazolam  Airway Management Planned: Mask, Oral ETT and Video Laryngoscope Planned  Additional Equipment: None  Intra-op Plan:   Post-operative Plan: Extubation in OR  Informed Consent: I have reviewed the patients History and Physical, chart, labs and discussed the procedure including the risks, benefits and alternatives for the proposed anesthesia with the patient or authorized representative who has indicated his/her understanding and acceptance.     Dental advisory given  Plan Discussed with: CRNA  Anesthesia Plan Comments:         Anesthesia Quick Evaluation

## 2024-04-22 NOTE — Discharge Instructions (Signed)

## 2024-04-22 NOTE — Transfer of Care (Signed)
 Immediate Anesthesia Transfer of Care Note  Patient: Anthony Delgado  Procedure(s) Performed: ATRIAL FIBRILLATION ABLATION  Patient Location: Cath Lab  Anesthesia Type:General  Level of Consciousness: drowsy and responds to stimulation  Airway & Oxygen Therapy: Patient Spontanous Breathing and Patient connected to face mask oxygen  Post-op Assessment: Report given to RN and Post -op Vital signs reviewed and stable  Post vital signs: Reviewed and stable  Last Vitals:  Vitals Value Taken Time  BP 122/63 04/22/24 09:50  Temp    Pulse 76 04/22/24 09:55  Resp 24 04/22/24 09:55  SpO2 97 % 04/22/24 09:55  Vitals shown include unfiled device data.  Last Pain:  Vitals:   04/22/24 0635  TempSrc:   PainSc: 0-No pain         Complications: There were no known notable events for this encounter.

## 2024-04-22 NOTE — Anesthesia Postprocedure Evaluation (Signed)
 Anesthesia Post Note  Patient: Anthony Delgado  Procedure(s) Performed: ATRIAL FIBRILLATION ABLATION     Patient location during evaluation: PACU Anesthesia Type: General Level of consciousness: awake and alert Pain management: pain level controlled Vital Signs Assessment: post-procedure vital signs reviewed and stable Respiratory status: spontaneous breathing, nonlabored ventilation, respiratory function stable and patient connected to nasal cannula oxygen Cardiovascular status: blood pressure returned to baseline and stable Postop Assessment: no apparent nausea or vomiting Anesthetic complications: no   There were no known notable events for this encounter.  Last Vitals:  Vitals:   04/22/24 1110 04/22/24 1115  BP:  121/89  Pulse: 75 74  Resp: 17 17  Temp:    SpO2: 94% 90%    Last Pain:  Vitals:   04/22/24 1035  TempSrc:   PainSc: 0-No pain                 Cordella P Bill Mcvey

## 2024-04-23 ENCOUNTER — Encounter: Payer: Self-pay | Admitting: Cardiology

## 2024-04-23 ENCOUNTER — Encounter (HOSPITAL_COMMUNITY): Payer: Self-pay | Admitting: Cardiology

## 2024-04-23 ENCOUNTER — Telehealth (HOSPITAL_COMMUNITY): Payer: Self-pay

## 2024-04-23 MED FILL — Fentanyl Citrate Preservative Free (PF) Inj 100 MCG/2ML: INTRAMUSCULAR | Qty: 1 | Status: AC

## 2024-04-23 NOTE — Telephone Encounter (Signed)
 Spoke with patient to complete post procedure follow up call.  Addressed MyChart message- patient reports tenderness at both groin sites but denies any complications with groin sites and inquires if he can take Ibuprofen .   Advised patient it is normal to have tenderness, bruising, mild swelling, and a pea or marble sized lump/knot at the groin site which can take up to three months to resolve. Recommended to avoid taking Ibuprofen  or any other NSAID medication due to taking OAC, and if no contraindication he can take OTC Tylenol  as directed for discomfort. He voiced understanding.  Patient also reported throat irritation from ETT. Suggested he can try to gargle warm salt water, use of throat lozenges, ice chips or if he prefer warm liquids, to help soothe throat.  Instructions reviewed with patient:  Remove large bandage at puncture site after 24 hours. Get help right away if you notice sudden swelling at the puncture site.  Check your puncture site every day for signs of infection: fever, redness, swelling, pus drainage, warmth, foul odor or excessive pain. If this occurs, please call the office at [phone], to speak with the nurse. Get help right away if your puncture site is bleeding and the bleeding does not stop after applying firm pressure to the area.  You may continue to have skipped beats/ atrial fibrillation during the first several months after your procedure.  It is very important not to miss any doses of your blood thinner Eliquis .  You will follow up with the Afib clinic on 05/21/24 and follow up with the Afib clinic on 07/23/24.    Patient verbalized understanding to all instructions provided and appreciation for the call.

## 2024-04-23 NOTE — Telephone Encounter (Signed)
 I received another form from [employer].  I called and spoke with a representative at [facility] and they said that the original form that was sent was incomplete.  I then gave a copy of the form to Sherri/Dr. Inocencio who completed the form.  I faxed updated form to [facility] and scanned it into the chart.

## 2024-04-24 ENCOUNTER — Telehealth: Payer: Self-pay | Admitting: Cardiology

## 2024-04-24 NOTE — Telephone Encounter (Signed)
  Are you calling in reference to your FMLA or disability form? FMLA   What is your question in regards to FMLA or disability form? Pt needs to have FMLA paperwork re filed as it was missing the dates that he was out of work, which was July 15th - July 19th. Please advise   Do you need copies of your medical records? NA   Are you waiting on a nurse to call you back with results or are you wanting copies of your results? NA    Please route to triage.  We will send to [facility] HIM if needed.

## 2024-04-24 NOTE — Telephone Encounter (Signed)
 Left message to call back.

## 2024-05-06 ENCOUNTER — Encounter: Payer: Self-pay | Admitting: *Deleted

## 2024-05-06 DIAGNOSIS — Z0279 Encounter for issue of other medical certificate: Secondary | ICD-10-CM

## 2024-05-06 NOTE — Telephone Encounter (Signed)
 Forms and payment were received on _9/9/25____ from patient. Placed in MD's box.   Patient brought in new form and stated it is due by 05/13/24, he is aware of 14 business day turnaround time but asked that if it would not be complete by 9/16 that we reach out to him to let him know so he would get an extension.

## 2024-05-12 ENCOUNTER — Telehealth: Payer: Self-pay | Admitting: Cardiology

## 2024-05-12 NOTE — Telephone Encounter (Signed)
 Completed FMLA forms faxed to [facility] form faxed to [employer]. All forms scanned into pt's chart.   JB, 05-12-24

## 2024-05-15 ENCOUNTER — Other Ambulatory Visit (HOSPITAL_COMMUNITY): Payer: Self-pay

## 2024-05-16 NOTE — Telephone Encounter (Signed)
 FMLA payment posted to pt's account.  JB, 05-16-24

## 2024-05-21 ENCOUNTER — Ambulatory Visit (HOSPITAL_COMMUNITY)
Admission: RE | Admit: 2024-05-21 | Discharge: 2024-05-21 | Disposition: A | Discharge status: Home / Self Care | Source: Ambulatory Visit | Attending: Physician Assistant | Admitting: Physician Assistant

## 2024-05-21 VITALS — BP 128/84 | HR 70 | Ht 72.0 in | Wt 389.6 lb

## 2024-05-21 DIAGNOSIS — I4819 Other persistent atrial fibrillation: Secondary | ICD-10-CM

## 2024-05-21 DIAGNOSIS — Z79899 Other long term (current) drug therapy: Secondary | ICD-10-CM

## 2024-05-21 DIAGNOSIS — Z5181 Encounter for therapeutic drug level monitoring: Secondary | ICD-10-CM

## 2024-05-21 DIAGNOSIS — I5032 Chronic diastolic (congestive) heart failure: Secondary | ICD-10-CM | POA: Diagnosis not present

## 2024-05-21 DIAGNOSIS — I4891 Unspecified atrial fibrillation: Secondary | ICD-10-CM | POA: Diagnosis not present

## 2024-05-21 DIAGNOSIS — D6869 Other thrombophilia: Secondary | ICD-10-CM

## 2024-05-21 NOTE — Progress Notes (Signed)
 Primary Care Physician: Tanda Bleacher, MD Referring Physician: [facility] f/u  Cardiologist: Dr. Pietro  Primary EP: Dr Inocencio Madeleine Gearing is a 48 y.o. male with a h/o  morbid obesity, HTN, ran out of his medication about 3 months ago due to PCP leaving and closing his practice and patient having difficulty establishing care. Presented to [facility] ED with worsening bilateral lower extremity edema, dyspnea, chest tightness.  He was found to be in A-fib with RVR, significantly fluid overloaded and was admitted to the hospital.  He was placed on diltiazem  infusion, Eliquis  and cardiology was consulted. His weight is usually in the 330s, it was as high as 357 on admission.    He was found to have HFrEF exacerbation and new onset afib with RVR.  He had  TEE cardioversion, but remained in atrial fibrillation for just a few beats with each shock. TEE showed severely reduced EF at 30% with moderate to severe tricuspid regurgitation, rt atrium severely dilated, mild MR.  He was  improved after diuresis and rate control.  Plan was  for outpatient follow up with cardiology for further management.  He is being seen in afib clinic today. He remains in rate controlled afib. He reports feeling much better, not aware of afib at this point. . Weight is stable, no unusual shortness of breath. He noted symptoms for about one month prior to presenting to the ED. He has been told that he snores teribly with apnea noted. He was put on cpap in hospital. He does not smoke, sue drugs or drink alcohol. 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   He is being compliant with meds. No missed eliquis  5 mg bid, he has been on drug for around a week started 05/29/22 prior to TEE which was negative for thrombus.  F/u in afib clinic, 06/20/22. He is here for Tikosyn  admit. He remains in rate controlled afib. Normovolemic. No missed anticoagulation or benadryl  use. His needs  were reviewed by PharmD and no contraindicated drugs on board. Qtc  is acceptable for tikosyn  use.  F/u in afib clinic, 08/29/21,  one week after Tikosyn  load. He did return to SR with cardioversion but ERAF. The plan was if he still had afib on return to clinic, to cardiovert again. Despite being in afib, he feels great. No missed anticoagulation.   Follow up 02/08/24. Patient returns for follow up for atrial fibrillation. Patient is s/p DCCV 01/08/24 but reverted back to afib after ~2-3 minutes. He does not have tachypalpitations but does note increased lower extremity edema when in afib. His edema responds well to Lasix . No bleeding issues on anticoagulation.   Follow up 03/03/24. Patient returns for follow up for atrial fibrillation and amiodarone  monitoring. He is s/p DCCV on 03/12/24 which was initially successful but he has had early return of afib. His SOB and lower extremity edema did improve while in SR. No bleeding issues on anticoagulation.   Follow up 05/21/24. Patient returns for follow up for atrial fibrillation and amiodarone  monitoring. He is s/p afib and flutter ablation with Dr Inocencio on 04/22/24. Patient reports he is feeling well post ablation with more energy. However, he continues to have significant issues with lower extremity edema. No orthopnea or PND.   Today, he  denies symptoms of palpitations, chest pain, shortness of breath, orthopnea, PND, dizziness, presyncope, syncope, snoring, daytime somnolence, bleeding, or neurologic sequela. The patient  is tolerating medications without difficulties and is otherwise without complaint today.    Past Medical History:  Diagnosis Date   A-fib (HCC)    Abnormal LFTs 07/06/2012   Cellulitis and abscess of trunk 07/06/2012   Congestive heart failure (CHF) (HCC)    Diarrhea 07/06/2012   Encephalopathy acute 07/06/2012   Hypertension    Hyponatremia 07/06/2012   Nausea & vomiting 07/06/2012   Staph aureus infection 07/06/2012   TIA  (transient ischemic attack)    TIA (transient ischemic attack) 04/02/2013    Current Outpatient Medications  Medication Sig Dispense Refill   amiodarone  (PACERONE ) 200 MG tablet Take 1 tablet (200 mg total) by mouth daily. 60 tablet 3   apixaban  (ELIQUIS ) 5 MG TABS tablet Take 1 tablet (5 mg total) by mouth 2 (two) times daily. 60 tablet 3   budesonide -formoterol  (SYMBICORT ) 160-4.5 MCG/ACT inhaler Inhale 2 puffs into the lungs 2 (two) times daily as needed (Shortness of breath).     carvedilol  (COREG ) 25 MG tablet Take 1 tablet (25 mg total) by mouth 2 (two) times daily. 180 tablet 3   furosemide  (LASIX ) 40 MG tablet Take 1 tablet (40 mg total) by mouth 2 (two) times daily. 60 tablet 3   levalbuterol  (XOPENEX  HFA) 45 MCG/ACT inhaler Inhale 2 puffs into the lungs every 6 (six) hours as needed for wheezing. 45 g 0   spironolactone  (ALDACTONE ) 25 MG tablet Take 1/2 tablet (12.5 mg total) by mouth daily. 15 tablet 3   No current facility-administered medications for this encounter.    ROS- All systems are reviewed and negative except as per the HPI above  Physical Exam: Vitals:   05/21/24 0838  BP: 128/84  Pulse: 70  Weight: (!) 176.7 kg  Height: 6' (1.829 m)     Wt Readings from Last 3 Encounters:  05/21/24 (!) 176.7 kg  04/22/24 (!) 174.6 kg  04/17/24 (!) 176 kg    GEN: Well nourished, well developed in no acute distress NECK: No JVD CARDIAC: Regular rate and rhythm, no murmurs, rubs, gallops RESPIRATORY:  Clear to auscultation without rales, wheezing or rhonchi  ABDOMEN: Soft, non-tender, non-distended EXTREMITIES:  1-2+ bilateral lower extremity edema, chronic venous stasis, No deformity    EKG today demonstrates SR Vent. rate 70 BPM PR interval 200 ms QRS duration 76 ms QT/QTcB 410/442 ms   Echo 07/23/23  1. Left ventricular ejection fraction, by estimation, is 60 to 65%. The  left ventricle has normal function. The left ventricle has no regional  wall motion  abnormalities. Left ventricular diastolic parameters were  normal.   2. Right ventricular systolic function is normal. The right ventricular  size is normal.   3. The mitral valve is normal in structure. No evidence of mitral valve  regurgitation. No evidence of mitral stenosis.   4. The aortic valve is tricuspid. There is mild calcification of the  aortic valve. Aortic valve regurgitation is not visualized. Aortic valve  sclerosis is present, with no evidence of aortic valve stenosis.   5. The inferior vena cava is normal in size with greater than 50%  respiratory variability, suggesting right atrial pressure of 3 mmHg.    CHA2DS2-VASc Score = 4  The patient's score is based upon: CHF History: 1 HTN History: 1 Diabetes History: 0 Stroke History: 2 (TIA) Vascular Disease History: 0 Age Score: 0 Gender Score: 0       ASSESSMENT AND PLAN: Persistent Atrial Fibrillation/atrial flutter (ICD10:  I48.19) The patient's CHA2DS2-VASc score is  4, indicating a 4.8% annual risk of stroke.   S/p afib and flutter ablation 04/22/24 Previously failed to maintain SR on dofetilide .  Patient appears to be maintaining SR Continue amiodarone  200 mg daily for now.  Continue Eliquis  5 mg BID with no missed doses for 3 months post ablation. Continue carvedilol  25 mg BID  Secondary Hypercoagulable State (ICD10:  D68.69) The patient is at significant risk for stroke/thromboembolism based upon his CHA2DS2-VASc Score of 4.  Continue Apixaban  (Eliquis ). No bleeding issues.   High Risk Medication Monitoring (ICD 10: [id number]) Patient requires ongoing monitoring for anti-arrhythmic medication which has the potential to cause life threatening arrhythmias. Intervals on ECG acceptable for amiodarone  monitoring.   HFrecEF EF 60-65%, suspected tachycardia mediated  Patient still having lower extremity edema despite treatment of his arrhythmia. Will have him follow up with his primary cardiology team.    Suspected OSA Seen by [facility] on 9/4  Obesity Body mass index is 52.84 kg/m.  Encouraged lifestyle modification   Follow up in the AF clinic in 2 months.     Daril Kicks PA-C [facility] [address] [place], [place] [address] [phone]

## 2024-05-27 ENCOUNTER — Telehealth: Payer: Self-pay | Admitting: Cardiology

## 2024-05-27 NOTE — Telephone Encounter (Signed)
 Patient reports [employer] approved FMLA for ablation, but not for cardioversion in July.  Patient states he takes off work the day before procedures because he works 3rd shift. For cardioversion he was out of work on July 15th, 16th, and 17th for the cardioversion and returned to work Sunday July 20th. He states he was not scheduled to work July 18th or 19th.  He states he needs updated paperwork sent to [facility] for the cardioversion in July with the dates listed above (out of work July 15th-17th, return to work on July 20th).  Will forward to Dr. Carolyn nurse to follow-up on this.

## 2024-05-27 NOTE — Telephone Encounter (Signed)
 Patient stated he is following-up on FMLA paperwork and wants a call back from [employer].

## 2024-05-28 NOTE — Telephone Encounter (Signed)
 Left message to call back.

## 2024-06-02 ENCOUNTER — Other Ambulatory Visit (HOSPITAL_COMMUNITY): Payer: Self-pay

## 2024-06-02 ENCOUNTER — Other Ambulatory Visit: Payer: Self-pay | Admitting: Nurse Practitioner

## 2024-06-03 ENCOUNTER — Other Ambulatory Visit (HOSPITAL_COMMUNITY): Payer: Self-pay

## 2024-06-03 ENCOUNTER — Other Ambulatory Visit: Payer: Self-pay | Admitting: Family Medicine

## 2024-06-04 ENCOUNTER — Other Ambulatory Visit (HOSPITAL_COMMUNITY): Payer: Self-pay

## 2024-06-04 MED ORDER — CARVEDILOL 12.5 MG PO TABS
12.5000 mg | ORAL_TABLET | Freq: Two times a day (BID) | ORAL | 0 refills | Status: DC
  Filled 2024-06-04: qty 60, 30d supply, fill #0
  Filled 2024-07-09: qty 60, 30d supply, fill #1
  Filled 2024-08-10: qty 60, 30d supply, fill #2

## 2024-06-05 NOTE — Telephone Encounter (Signed)
 Pt needs July FMLA to start on 7/15 not 7/16.  Patient aware I will make correction and we will get faxed out.  He appreciates the help with this.

## 2024-06-06 NOTE — Telephone Encounter (Signed)
 Updated [employer] form faxed and scanned into patient's chart.

## 2024-06-11 ENCOUNTER — Encounter: Payer: Self-pay | Admitting: Cardiology

## 2024-06-11 ENCOUNTER — Other Ambulatory Visit (HOSPITAL_COMMUNITY): Payer: Self-pay

## 2024-06-11 ENCOUNTER — Ambulatory Visit: Attending: Cardiology | Admitting: Cardiology

## 2024-06-11 VITALS — BP 142/86 | HR 93 | Ht 72.0 in | Wt 386.0 lb

## 2024-06-11 DIAGNOSIS — I4819 Other persistent atrial fibrillation: Secondary | ICD-10-CM

## 2024-06-11 DIAGNOSIS — I5020 Unspecified systolic (congestive) heart failure: Secondary | ICD-10-CM | POA: Diagnosis not present

## 2024-06-11 DIAGNOSIS — D6869 Other thrombophilia: Secondary | ICD-10-CM | POA: Diagnosis not present

## 2024-06-11 DIAGNOSIS — E782 Mixed hyperlipidemia: Secondary | ICD-10-CM

## 2024-06-11 DIAGNOSIS — Z79899 Other long term (current) drug therapy: Secondary | ICD-10-CM | POA: Diagnosis not present

## 2024-06-11 DIAGNOSIS — Z8673 Personal history of transient ischemic attack (TIA), and cerebral infarction without residual deficits: Secondary | ICD-10-CM

## 2024-06-11 MED ORDER — DAPAGLIFLOZIN PROPANEDIOL 10 MG PO TABS
10.0000 mg | ORAL_TABLET | Freq: Every day | ORAL | 3 refills | Status: AC
  Filled 2024-06-11: qty 30, 30d supply, fill #0
  Filled 2024-07-10: qty 30, 30d supply, fill #1
  Filled 2024-08-10: qty 30, 30d supply, fill #2
  Filled 2024-09-08: qty 30, 30d supply, fill #3

## 2024-06-11 MED ORDER — ROSUVASTATIN CALCIUM 20 MG PO TABS
20.0000 mg | ORAL_TABLET | Freq: Every day | ORAL | 3 refills | Status: AC
  Filled 2024-06-11: qty 30, 30d supply, fill #0
  Filled 2024-07-10: qty 30, 30d supply, fill #1
  Filled 2024-08-10: qty 30, 30d supply, fill #2
  Filled 2024-09-08: qty 30, 30d supply, fill #3

## 2024-06-11 NOTE — Progress Notes (Signed)
 Cardiology Office Note:  .   Date:  06/11/2024  ID:  Anthony Delgado, DOB Dec 18, 1975, MRN [id number] PCP:  Tanda Bleacher, MD  Former Cardiology Providers: Redell Shallow, MD [facility] Providers Cardiologist:  Madonna Large, DO, [facility] (established care 06/11/24) Electrophysiologist:  Will Gladis Norton, MD  Electrophysiologist:  Will Gladis Norton, MD  Click to update primary MD,subspecialty MD or APP then REFRESH:1}    Chief Complaint  Patient presents with   Establish Care    Heart failure, establish care    Patient Profile: .   Anthony Delgado is a 48 y.o. African-American male whose past medical history and cardiovascular risk factors includes: Persistent atrial fibrillation/flutter, status post cardioversion and status post atrial fibrillation and flutter ablation, heart failure with improved ejection fraction, tricuspid regurgitation, history of TIA, hyperlipidemia,  History of Present Illness: .    Patient is referred to the practice by A-fib clinic to reestablish care with general cardiology.  Last progress note from EP 05/17/2024 reviewed as part of transition of care.  In summary, patient has had a long history of persistent atrial fibrillation/flutter and heart failure with reduced EF.  He has undergone cardioversions but failed to maintain sinus rhythm.  Based on EMR patient also failed Tikosyn  and then later was placed on amiodarone .  He underwent atrial fibrillation and flutter ablation with Dr. Norton on 04/22/24.   Patient is referred to general cardiology to establish care given his comorbid conditions. Patient denies anginal chest pain at rest or with effort related activities. He works the third shift. Currently fasting. Complains of lower extremity swelling as well as scrotal edema. Compliant with current medical therapy. States that he was diagnosed with sleep apnea yesterday and is awaiting a device. Ambulates 10 minutes a day.  Review of Systems: .    Review of Systems  Cardiovascular:  Positive for dyspnea on exertion (chronic and stable) and leg swelling. Negative for chest pain, claudication, irregular heartbeat, near-syncope, orthopnea, palpitations, paroxysmal nocturnal dyspnea and syncope.       Scrotal swelling  Respiratory:  Negative for shortness of breath.   Hematologic/Lymphatic: Negative for bleeding problem.    Studies Reviewed:   Echocardiogram: 05/30/2022: LVEF 35-40%, global hypokinesis, LAE mildly dilated, RA severely dilated, see report for additional details.  07/23/23  1. Left ventricular ejection fraction, by estimation, is 60 to 65%. The  left ventricle has normal function. The left ventricle has no regional  wall motion abnormalities. Left ventricular diastolic parameters were  normal.   2. Right ventricular systolic function is normal. The right ventricular  size is normal.   3. The mitral valve is normal in structure. No evidence of mitral valve  regurgitation. No evidence of mitral stenosis.   4. The aortic valve is tricuspid. There is mild calcification of the  aortic valve. Aortic valve regurgitation is not visualized. Aortic valve  sclerosis is present, with no evidence of aortic valve stenosis.   5. The inferior vena cava is normal in size with greater than 50%  respiratory variability, suggesting right atrial pressure of 3 mmHg.   RADIOLOGY: NA  Risk Assessment/Calculations:   Click Here to Calculate/Change CHADS2VASc Score The patient's CHADS2-VASc score is 4, indicating a 4.8% annual risk of stroke.   CHF History: Yes HTN History: Yes Diabetes History: No Stroke History: Yes Vascular Disease History: No  The 10-year ASCVD risk score (Arnett DK, et al., 2019) is: 12.3%   Values used to calculate the score:     Age: 20  years     Clincally relevant sex: Male     Is Non-Hispanic African American: Yes     Diabetic: No     Tobacco smoker: No     Systolic Blood Pressure: 142 mmHg     Is BP  treated: Yes     HDL Cholesterol: 26 mg/dL     Total Cholesterol: 172 mg/dL  Labs:       Latest Ref Rng & Units 04/17/2024    8:59 AM 03/11/2024   10:26 AM 01/23/2024   10:21 PM  CBC  WBC 3.4 - 10.8 x10E3/uL 5.1  5.4  5.9   Hemoglobin 13.0 - 17.7 g/dL 83.7  84.5  84.4   Hematocrit 37.5 - 51.0 % 47.9  46.1  47.7   Platelets 150 - 450 x10E3/uL 241  215  257        Latest Ref Rng & Units 04/17/2024    8:59 AM 03/11/2024   10:26 AM 01/23/2024   10:21 PM  BMP  Glucose 70 - 99 mg/dL 86  878  888   BUN 6 - 24 mg/dL 7  13  9    Creatinine 0.76 - 1.27 mg/dL 8.73  8.72  8.78   BUN/Creat Ratio 9 - 20 6  10     Sodium 134 - 144 mmol/L 138  135  138   Potassium 3.5 - 5.2 mmol/L 4.6  4.0  4.3   Chloride 96 - 106 mmol/L 102  101  103   CO2 20 - 29 mmol/L 18  26  27    Calcium 8.7 - 10.2 mg/dL 8.5  8.8  8.7       Latest Ref Rng & Units 04/17/2024    8:59 AM 03/11/2024   10:26 AM 01/23/2024   10:21 PM  CMP  Glucose 70 - 99 mg/dL 86  878  888   BUN 6 - 24 mg/dL 7  13  9    Creatinine 0.76 - 1.27 mg/dL 8.73  8.72  8.78   Sodium 134 - 144 mmol/L 138  135  138   Potassium 3.5 - 5.2 mmol/L 4.6  4.0  4.3   Chloride 96 - 106 mmol/L 102  101  103   CO2 20 - 29 mmol/L 18  26  27    Calcium 8.7 - 10.2 mg/dL 8.5  8.8  8.7     Lab Results  Component Value Date   CHOL 172 08/07/2023   HDL 26 (L) 08/07/2023   LDLCALC 112 (H) 08/07/2023   TRIG 189 (H) 08/07/2023   CHOLHDL 6.6 (H) 08/07/2023   No results for input(s): LIPOA in the last 8760 hours. No components found for: NTPROBNP No results for input(s): PROBNP in the last 8760 hours. Recent Labs    02/08/24 1018  TSH 4.410    Physical Exam:    Today's Vitals   06/11/24 0907  BP: (!) 142/86  Pulse: 93  SpO2: 97%  Weight: (!) 386 lb (175.1 kg)  Height: 6' (1.829 m)   Body mass index is 52.35 kg/m. Wt Readings from Last 3 Encounters:  06/11/24 (!) 386 lb (175.1 kg)  05/21/24 (!) 389 lb 9.6 oz (176.7 kg)  04/22/24 (!) 385 lb  (174.6 kg)    Physical Exam  Constitutional: No distress.  hemodynamically stable  Neck: No JVD present.  Cardiovascular: Normal rate, regular rhythm, S1 normal and S2 normal. Exam reveals no gallop, no S3 and no S4.  No murmur heard. Pulmonary/Chest: Effort normal and breath sounds  normal. No stridor. He has no wheezes. He has no rales.  Musculoskeletal:        General: Edema present.     Cervical back: Neck supple.  Skin: Skin is warm.   Impression & Recommendation(s):  Impression:   ICD-10-CM   1. Heart failure with recovered ejection fraction (HFrecEF) (HCC)  I50.20 Pro b natriuretic peptide (BNP)    dapagliflozin  propanediol (FARXIGA ) 10 MG TABS tablet    AMB Referral to [facility] Pharm-D    Basic Metabolic Panel (BMET)    Pro b natriuretic peptide (BNP)    Pro b natriuretic peptide (BNP)    Basic Metabolic Panel (BMET)    2. Persistent atrial fibrillation (HCC)  I48.19     3. Secondary hypercoagulable state  D68.69     4. High risk medication use  Z79.899     5. Hx-TIA (transient ischemic attack)  Z86.73 Lipid panel    Comprehensive metabolic panel with GFR    rosuvastatin (CRESTOR) 20 MG tablet    6. Mixed hyperlipidemia  E78.2 Lipid panel    Comprehensive metabolic panel with GFR    rosuvastatin (CRESTOR) 20 MG tablet       Recommendation(s):  Heart failure with recovered ejection fraction (HFrecEF) (HCC) Stage C, NYHA class II/III. Likely precipitated by A-fib with RVR Most recent echocardiogram notes preserved LVEF. Continue carvedilol  37.5 mg p.o. twice daily. Continue spironolactone  12.5 mg p.o. daily Continue Lasix  40 mg p.o. twice daily Start Farxiga  10 mg p.o. daily. Baseline lab today and a repeat BMP and NT proBNP in a week Will refer him to Pharm.D. clinic for uptitration of GDMT given his swelling.  Will see him back in 3 months to reevaluate and uptitrate as needed.  Persistent atrial fibrillation (HCC) Rate control: Carvedilol  Rhythm  control: Amiodarone , followed by A-fib clinic. Thromboembolic prophylaxis: Eliquis . Status post cardioversion. Previously failed to maintain sinus rhythm on Tikosyn . Status post atrial fibrillation and flutter ablation with Dr. Inocencio 04/22/2024 Alcohol consumption: None Sleep evaluation: awaiting device, follows [facility]   Secondary hypercoagulable state Indication persistent atrial fibrillation. High CHA2DS2-VASc 4. Currently on Eliquis . Does not endorse evidence of bleeding. Risks, benefits, and alternatives to anticoagulation discussed  High risk medication use Previously on Tikosyn . Now on amiodarone , side effect profile managed by A-fib clinic  Hx-TIA (transient ischemic attack) Reemphasized the importance of secondary prevention with focus on improving the modifiable cardiovascular risk factors such as glycemic control, lipid management, blood pressure control, weight loss.  Mixed hyperlipidemia Index LDL 112 mg/dL, triglycerides 810 mg/dL ASCVD risk 87.6%. History of TIA Not on lipid-lowering agents. Will check fasting lipids at baseline Start Crestor 20 mg p.o. nightly will need fasting lipids and CMP in 6 weeks to reevaluate therapy  Orders Placed:  Orders Placed This Encounter  Procedures   Pro b natriuretic peptide (BNP)   Lipid panel   Comprehensive metabolic panel with GFR   Basic Metabolic Panel (BMET)    Standing Status:   Future    Number of Occurrences:   1    Expected Date:   06/18/2024    Expiration Date:   06/11/2025   Pro b natriuretic peptide (BNP)    Standing Status:   Future    Number of Occurrences:   1    Expected Date:   06/18/2024    Expiration Date:   06/11/2025   AMB Referral to [facility] Pharm-D    Referral Priority:   Routine    Referral Type:   Consultation  Referral Reason:   Specialty Services Required    Number of Visits Requested:   1     Final Medication List:    Meds ordered this encounter  Medications    dapagliflozin  propanediol (FARXIGA ) 10 MG TABS tablet    Sig: Take 1 tablet (10 mg total) by mouth daily before breakfast.    Dispense:  90 tablet    Refill:  3   rosuvastatin (CRESTOR) 20 MG tablet    Sig: Take 1 tablet (20 mg total) by mouth daily.    Dispense:  90 tablet    Refill:  3    There are no discontinued medications.   Current Outpatient Medications:    amiodarone  (PACERONE ) 200 MG tablet, Take 1 tablet (200 mg total) by mouth daily., Disp: 60 tablet, Rfl: 3   apixaban  (ELIQUIS ) 5 MG TABS tablet, Take 1 tablet (5 mg total) by mouth 2 (two) times daily., Disp: 60 tablet, Rfl: 3   budesonide -formoterol  (SYMBICORT ) 160-4.5 MCG/ACT inhaler, Inhale 2 puffs into the lungs 2 (two) times daily as needed (Shortness of breath)., Disp: , Rfl:    carvedilol  (COREG ) 12.5 MG tablet, Take 1 tablet (12.5 mg total) by mouth 2 (two) times daily., Disp: 180 tablet, Rfl: 0   carvedilol  (COREG ) 25 MG tablet, Take 1 tablet (25 mg total) by mouth 2 (two) times daily., Disp: 180 tablet, Rfl: 3   dapagliflozin  propanediol (FARXIGA ) 10 MG TABS tablet, Take 1 tablet (10 mg total) by mouth daily before breakfast., Disp: 90 tablet, Rfl: 3   furosemide  (LASIX ) 40 MG tablet, Take 1 tablet (40 mg total) by mouth 2 (two) times daily., Disp: 60 tablet, Rfl: 3   levalbuterol  (XOPENEX  HFA) 45 MCG/ACT inhaler, Inhale 2 puffs into the lungs every 6 (six) hours as needed for wheezing., Disp: 45 g, Rfl: 0   rosuvastatin (CRESTOR) 20 MG tablet, Take 1 tablet (20 mg total) by mouth daily., Disp: 90 tablet, Rfl: 3   spironolactone  (ALDACTONE ) 25 MG tablet, Take 1/2 tablet (12.5 mg total) by mouth daily., Disp: 15 tablet, Rfl: 3  Consent:   NA  Disposition:   69-month follow-up sooner if needed  His questions and concerns were addressed to his satisfaction. He voices understanding of the recommendations provided during this encounter.    Signed, Madonna Michele HAS, [facility]  A Division of [place]  [facility] [address] [place], [place] [address]  06/11/2024 12:12 PM

## 2024-06-11 NOTE — Patient Instructions (Addendum)
 Medication Instructions:  Your physician has recommended you make the following change in your medication:  Start farxiga  10mg  daily Start crestor 20 mg daily at night  Lab Work: CMP, Lipid, BNP -- Today BMP, BNP -- 2 weeks  You may go to any [facility] Location for your lab work:  [facility] - [address] ([facility]) - [address] [phone] [address]  [place] - [address]   [place]  - [address]   [place] - [address] - [address] [employer]  - [address] ([facility]   If you have labs (blood work) drawn today and your tests are completely normal, you will receive your results only by: [employer] (if you have MyChart)  If you have any lab test that is abnormal or we need to change your treatment, we will call you or send a MyChart message to review the results.  Testing/Procedures: None  Follow-Up: At [facility], you and your health needs are our priority.  As part of our continuing mission to provide you with exceptional heart care, we have created designated Provider Care Teams.  These Care Teams include your primary Cardiologist (physician) and Advanced Practice Providers (APPs -  Physician Assistants and Nurse Practitioners) who all work together to provide you with the care you need, when you need it.    Your next appointment:   Referral to Pharmacy team- They will call you for that appointment 3 months  The format for your next appointment:   In Person  Provider:   Madonna Large, DO

## 2024-06-12 ENCOUNTER — Ambulatory Visit (INDEPENDENT_AMBULATORY_CARE_PROVIDER_SITE_OTHER): Admitting: Family Medicine

## 2024-06-12 VITALS — BP 138/96 | HR 78 | Ht 72.0 in | Wt 383.8 lb

## 2024-06-12 DIAGNOSIS — Z13228 Encounter for screening for other metabolic disorders: Secondary | ICD-10-CM

## 2024-06-12 DIAGNOSIS — Z136 Encounter for screening for cardiovascular disorders: Secondary | ICD-10-CM

## 2024-06-12 DIAGNOSIS — Z0000 Encounter for general adult medical examination without abnormal findings: Secondary | ICD-10-CM | POA: Diagnosis not present

## 2024-06-12 DIAGNOSIS — Z1329 Encounter for screening for other suspected endocrine disorder: Secondary | ICD-10-CM

## 2024-06-12 DIAGNOSIS — Z130 Encounter for screening for diseases of the blood and blood-forming organs and certain disorders involving the immune mechanism: Secondary | ICD-10-CM

## 2024-06-12 LAB — BASIC METABOLIC PANEL WITH GFR
BUN/Creatinine Ratio: 11 (ref 9–20)
BUN: 12 mg/dL (ref 6–24)
CO2: 22 mmol/L (ref 20–29)
Calcium: 9 mg/dL (ref 8.7–10.2)
Chloride: 100 mmol/L (ref 96–106)
Creatinine, Ser: 1.1 mg/dL (ref 0.76–1.27)
Glucose: 82 mg/dL (ref 70–99)
Potassium: 4.3 mmol/L (ref 3.5–5.2)
Sodium: 140 mmol/L (ref 134–144)
eGFR: 82 mL/min/1.73 (ref >59)

## 2024-06-12 LAB — COMPREHENSIVE METABOLIC PANEL WITH GFR
ALT: 32 IU/L (ref 0–44)
AST: 45 IU/L — ABNORMAL HIGH (ref 0–40)
Albumin: 4 g/dL — ABNORMAL LOW (ref 4.1–5.1)
Alkaline Phosphatase: 76 IU/L (ref 47–123)
BUN/Creatinine Ratio: 10 (ref 9–20)
BUN: 12 mg/dL (ref 6–24)
Bilirubin Total: 0.3 mg/dL (ref 0.0–1.2)
CO2: 24 mmol/L (ref 20–29)
Calcium: 9 mg/dL (ref 8.7–10.2)
Chloride: 100 mmol/L (ref 96–106)
Creatinine, Ser: 1.22 mg/dL (ref 0.76–1.27)
Globulin, Total: 3.5 g/dL (ref 1.5–4.5)
Glucose: 76 mg/dL (ref 70–99)
Potassium: 4.8 mmol/L (ref 3.5–5.2)
Sodium: 140 mmol/L (ref 134–144)
Total Protein: 7.5 g/dL (ref 6.0–8.5)
eGFR: 72 mL/min/1.73 (ref >59)

## 2024-06-12 LAB — LIPID PANEL
Chol/HDL Ratio: 6.1 ratio — ABNORMAL HIGH (ref 0.0–5.0)
Cholesterol, Total: 200 mg/dL — ABNORMAL HIGH (ref 100–199)
HDL: 33 mg/dL — ABNORMAL LOW (ref >39)
LDL Chol Calc (NIH): 140 mg/dL — ABNORMAL HIGH (ref 0–99)
Triglycerides: 146 mg/dL (ref 0–149)
VLDL Cholesterol Cal: 27 mg/dL (ref 5–40)

## 2024-06-12 LAB — PRO B NATRIURETIC PEPTIDE: NT-Pro BNP: <36 pg/mL (ref 0–121)

## 2024-06-12 NOTE — Progress Notes (Signed)
 Established Patient Office Visit  Subjective    Patient ID: Anthony Delgado, male    DOB: Jul 23, 1976  Age: 48 y.o. MRN: [id number]  CC:  Chief Complaint  Patient presents with   Annual Exam    HPI Anthony Delgado presents for routine annual exam. Patient denies acute complaints.   Outpatient Encounter Medications as of 06/12/2024  Medication Sig   amiodarone  (PACERONE ) 200 MG tablet Take 1 tablet (200 mg total) by mouth daily.   apixaban  (ELIQUIS ) 5 MG TABS tablet Take 1 tablet (5 mg total) by mouth 2 (two) times daily.   budesonide -formoterol  (SYMBICORT ) 160-4.5 MCG/ACT inhaler Inhale 2 puffs into the lungs 2 (two) times daily as needed (Shortness of breath).   carvedilol  (COREG ) 25 MG tablet Take 1 tablet (25 mg total) by mouth 2 (two) times daily.   dapagliflozin  propanediol (FARXIGA ) 10 MG TABS tablet Take 1 tablet (10 mg total) by mouth daily before breakfast.   furosemide  (LASIX ) 40 MG tablet Take 1 tablet (40 mg total) by mouth 2 (two) times daily.   levalbuterol  (XOPENEX  HFA) 45 MCG/ACT inhaler Inhale 2 puffs into the lungs every 6 (six) hours as needed for wheezing.   rosuvastatin (CRESTOR) 20 MG tablet Take 1 tablet (20 mg total) by mouth daily.   spironolactone  (ALDACTONE ) 25 MG tablet Take 1/2 tablet (12.5 mg total) by mouth daily.   carvedilol  (COREG ) 12.5 MG tablet Take 1 tablet (12.5 mg total) by mouth 2 (two) times daily. (Patient not taking: Reported on 06/12/2024)   No facility-administered encounter medications on file as of 06/12/2024.    Past Medical History:  Diagnosis Date   A-fib (HCC)    Abnormal LFTs 07/06/2012   Cellulitis and abscess of trunk 07/06/2012   Congestive heart failure (CHF) (HCC)    Diarrhea 07/06/2012   Encephalopathy acute 07/06/2012   Hypertension    Hyponatremia 07/06/2012   Nausea & vomiting 07/06/2012   Staph aureus infection 07/06/2012   TIA (transient ischemic attack)    TIA (transient ischemic attack) 04/02/2013    Past  Surgical History:  Procedure Laterality Date   ATRIAL FIBRILLATION ABLATION N/A 04/22/2024   Procedure: ATRIAL FIBRILLATION ABLATION;  Surgeon: Inocencio Soyla Lunger, MD;  Location: [facility] INVASIVE CV LAB;  Service: Cardiovascular;  Laterality: N/A;   BIOPSY  08/09/2023   Procedure: BIOPSY;  Surgeon: Federico Rosario BROCKS, MD;  Location: [facility] ENDOSCOPY;  Service: Gastroenterology;;   CARDIOVERSION N/A 06/01/2022   Procedure: CARDIOVERSION;  Surgeon: Jeffrie Oneil BROCKS, MD;  Location: [facility] ENDOSCOPY;  Service: Cardiovascular;  Laterality: N/A;   CARDIOVERSION N/A 06/22/2022   Procedure: CARDIOVERSION;  Surgeon: Barbaraann Darryle Ned, MD;  Location: [facility] ENDOSCOPY;  Service: Cardiovascular;  Laterality: N/A;   CARDIOVERSION N/A 07/05/2022   Procedure: CARDIOVERSION;  Surgeon: Sheena Pugh, DO;  Location: [facility] ENDOSCOPY;  Service: Cardiovascular;  Laterality: N/A;   CARDIOVERSION N/A 01/08/2024   Procedure: CARDIOVERSION;  Surgeon: Francyne Headland, MD;  Location: [facility] INVASIVE CV LAB;  Service: Cardiovascular;  Laterality: N/A;   CARDIOVERSION N/A 03/12/2024   Procedure: CARDIOVERSION;  Surgeon: Shlomo Wilbert SAUNDERS, MD;  Location: [facility] INVASIVE CV LAB;  Service: Cardiovascular;  Laterality: N/A;   CHOLECYSTECTOMY  2009   COLONOSCOPY WITH PROPOFOL  N/A 08/09/2023   Procedure: COLONOSCOPY WITH PROPOFOL ;  Surgeon: Federico Rosario BROCKS, MD;  Location: [facility] ENDOSCOPY;  Service: Gastroenterology;  Laterality: N/A;   ESOPHAGOGASTRODUODENOSCOPY (EGD) WITH PROPOFOL  N/A 08/09/2023   Procedure: ESOPHAGOGASTRODUODENOSCOPY (EGD) WITH PROPOFOL ;  Surgeon: Federico Rosario BROCKS, MD;  Location: [facility] ENDOSCOPY;  Service: Gastroenterology;  Laterality: N/A;   POLYPECTOMY  08/09/2023   Procedure: POLYPECTOMY;  Surgeon: Federico Rosario BROCKS, MD;  Location: [facility] ENDOSCOPY;  Service: Gastroenterology;;   TEE WITHOUT CARDIOVERSION N/A 06/01/2022   Procedure: TRANSESOPHAGEAL ECHOCARDIOGRAM (TEE);  Surgeon: Jeffrie Oneil BROCKS, MD;  Location: [facility] ENDOSCOPY;  Service: Cardiovascular;   Laterality: N/A;    Family History  Problem Relation Age of Onset   Hypertension Mother    Kidney failure Father    Hypertension Sister    Prostate cancer Maternal Uncle    Hypertension Maternal Uncle    Stroke Neg Hx        None at early ages   CAD Neg Hx        None at early ages    Social History   Socioeconomic History   Marital status: Married    Spouse name: Inocente   Number of children: 4   Years of education: Not on file   Highest education level: Some college, no degree  Occupational History   Occupation: Ecologist  Tobacco Use   Smoking status: Never   Smokeless tobacco: Never   Tobacco comments:    Never smoked 02/08/24  Vaping Use   Vaping status: Never Used  Substance and Sexual Activity   Alcohol use: No   Drug use: No   Sexual activity: Yes    Partners: Female  Other Topics Concern   Not on file  Social History Narrative   Not on file   Social Drivers of Health   Financial Resource Strain: Low Risk  (06/12/2024)   Overall Financial Resource Strain (CARDIA)    Difficulty of Paying Living Expenses: Not hard at all  Food Insecurity: No Food Insecurity (06/12/2024)   Hunger Vital Sign    Worried About Running Out of Food in the Last Year: Never true    Ran Out of Food in the Last Year: Never true  Transportation Needs: No Transportation Needs (06/12/2024)   PRAPARE - Administrator, Civil Service (Medical): No    Lack of Transportation (Non-Medical): No  Physical Activity: Insufficiently Active (06/12/2024)   Exercise Vital Sign    Days of Exercise per Week: 2 days    Minutes of Exercise per Session: 10 min  Stress: No Stress Concern Present (06/12/2024)   [employer] of Occupational Health - Occupational Stress Questionnaire    Feeling of Stress: Not at all  Social Connections: Socially Integrated (06/12/2024)   Social Connection and Isolation Panel    Frequency of Communication with Friends and Family: More than three times a  week    Frequency of Social Gatherings with Friends and Family: Twice a week    Attends Religious Services: More than 4 times per year    Active Member of [employer] or Organizations: Yes    Attends Banker Meetings: More than 4 times per year    Marital Status: Married  Catering manager Violence: Not At Risk (06/20/2022)   Humiliation, Afraid, Rape, and Kick questionnaire    Fear of Current or Ex-Partner: No    Emotionally Abused: No    Physically Abused: No    Sexually Abused: No    Review of Systems  All other systems reviewed and are negative.       Objective    BP (!) 138/96   Pulse 78   Ht 6' (1.829 m)   Wt (!) 383 lb 12.8 oz (174.1 kg)   SpO2 95%   BMI 52.05 kg/m  Physical Exam Vitals and nursing note reviewed.  Constitutional:      General: He is not in acute distress.    Appearance: He is obese.  HENT:     Head: Normocephalic and atraumatic.     Right Ear: Tympanic membrane, ear canal and external ear normal.     Left Ear: Tympanic membrane, ear canal and external ear normal.     Nose: Nose normal.     Mouth/Throat:     Mouth: Mucous membranes are moist.     Pharynx: Oropharynx is clear.  Eyes:     Conjunctiva/sclera: Conjunctivae normal.     Pupils: Pupils are equal, round, and reactive to light.  Neck:     Thyroid: No thyromegaly.  Cardiovascular:     Rate and Rhythm: Normal rate and regular rhythm.     Heart sounds: Normal heart sounds. No murmur heard. Pulmonary:     Effort: Pulmonary effort is normal.     Breath sounds: Normal breath sounds.  Abdominal:     General: There is no distension.     Palpations: Abdomen is soft. There is no mass.     Tenderness: There is no abdominal tenderness.     Hernia: There is no hernia in the left inguinal area or right inguinal area.  Musculoskeletal:        General: Normal range of motion.     Cervical back: Normal range of motion and neck supple.     Right lower leg: No edema.     Left lower  leg: No edema.  Skin:    General: Skin is warm and dry.  Neurological:     General: No focal deficit present.     Mental Status: He is alert and oriented to person, place, and time. Mental status is at baseline.  Psychiatric:        Mood and Affect: Mood normal.        Behavior: Behavior normal.         Assessment & Plan:   Annual physical exam -     Comprehensive metabolic panel with GFR  Screening for deficiency anemia -     CBC with Differential/Platelet  Encounter for screening for cardiovascular disorders -     Lipid panel -     CBC with Differential/Platelet  Screening for endocrine/metabolic/immunity disorders -     Hemoglobin A1c     Return in about 6 months (around 12/11/2024) for follow up.   Tanda Raguel SQUIBB, MD

## 2024-06-13 LAB — COMPREHENSIVE METABOLIC PANEL WITH GFR
ALT: 34 IU/L (ref 0–44)
AST: 44 IU/L — ABNORMAL HIGH (ref 0–40)
Albumin: 4.3 g/dL (ref 4.1–5.1)
Alkaline Phosphatase: 82 IU/L (ref 47–123)
BUN/Creatinine Ratio: 10 (ref 9–20)
BUN: 11 mg/dL (ref 6–24)
Bilirubin Total: 0.3 mg/dL (ref 0.0–1.2)
CO2: 21 mmol/L (ref 20–29)
Calcium: 9.1 mg/dL (ref 8.7–10.2)
Chloride: 102 mmol/L (ref 96–106)
Creatinine, Ser: 1.14 mg/dL (ref 0.76–1.27)
Globulin, Total: 3.8 g/dL (ref 1.5–4.5)
Glucose: 94 mg/dL (ref 70–99)
Potassium: 3.8 mmol/L (ref 3.5–5.2)
Sodium: 139 mmol/L (ref 134–144)
Total Protein: 8.1 g/dL (ref 6.0–8.5)
eGFR: 78 mL/min/1.73 (ref >59)

## 2024-06-13 LAB — LIPID PANEL
Chol/HDL Ratio: 6 ratio — ABNORMAL HIGH (ref 0.0–5.0)
Cholesterol, Total: 203 mg/dL — ABNORMAL HIGH (ref 100–199)
HDL: 34 mg/dL — ABNORMAL LOW (ref >39)
LDL Chol Calc (NIH): 128 mg/dL — ABNORMAL HIGH (ref 0–99)
Triglycerides: 229 mg/dL — ABNORMAL HIGH (ref 0–149)
VLDL Cholesterol Cal: 41 mg/dL — ABNORMAL HIGH (ref 5–40)

## 2024-06-13 LAB — CBC WITH DIFFERENTIAL/PLATELET
Basophils Absolute: 0.1 x10E3/uL (ref 0.0–0.2)
Basos: 1 %
EOS (ABSOLUTE): 0.2 x10E3/uL (ref 0.0–0.4)
Eos: 3 %
Hematocrit: 49.1 % (ref 37.5–51.0)
Hemoglobin: 16 g/dL (ref 13.0–17.7)
Immature Grans (Abs): 0 x10E3/uL (ref 0.0–0.1)
Immature Granulocytes: 0 %
Lymphocytes Absolute: 2.2 x10E3/uL (ref 0.7–3.1)
Lymphs: 50 %
MCH: 31.1 pg (ref 26.6–33.0)
MCHC: 32.6 g/dL (ref 31.5–35.7)
MCV: 95 fL (ref 79–97)
Monocytes Absolute: 0.4 x10E3/uL (ref 0.1–0.9)
Monocytes: 8 %
Neutrophils Absolute: 1.7 x10E3/uL (ref 1.4–7.0)
Neutrophils: 38 %
Platelets: 273 x10E3/uL (ref 150–450)
RBC: 5.15 x10E6/uL (ref 4.14–5.80)
RDW: 13.6 % (ref 11.6–15.4)
WBC: 4.5 x10E3/uL (ref 3.4–10.8)

## 2024-06-13 LAB — HEMOGLOBIN A1C
Est. average glucose Bld gHb Est-mCnc: 131 mg/dL
Hgb A1c MFr Bld: 6.2 % — ABNORMAL HIGH (ref 4.8–5.6)

## 2024-06-20 ENCOUNTER — Ambulatory Visit: Payer: Self-pay | Admitting: Cardiology

## 2024-06-20 DIAGNOSIS — E782 Mixed hyperlipidemia: Secondary | ICD-10-CM

## 2024-06-20 DIAGNOSIS — I5020 Unspecified systolic (congestive) heart failure: Secondary | ICD-10-CM

## 2024-07-01 ENCOUNTER — Ambulatory Visit: Payer: Self-pay | Admitting: Family Medicine

## 2024-07-03 ENCOUNTER — Other Ambulatory Visit (HOSPITAL_COMMUNITY): Payer: Self-pay

## 2024-07-03 ENCOUNTER — Ambulatory Visit

## 2024-07-03 NOTE — Progress Notes (Deleted)
 Patient ID: [name]                 DOB: 1975-09-18                      MRN: [id number]     HPI:  Heart failure Anthony Delgado is a 48 y.o. male referred by Dr. Michele to pharmacy clinic for HF medication management. PMH is significant for atrial fibrillation/flutter, status post cardioversion and status post atrial fibrillation and flutter ablation (03/2024), HFimpEF, HTN, history of TIA (03/2013), and HLD. Most recent LVEF 60 - 65% on 02/2023.  Patient presents today .SABRA Patient was seen by Dr. Michele last month where Farxiga  and rosuvastatin were initiated. Patient reports .SABRA  Discussion with patient today included the following: cardiac medication indications, introduction to GDMT clinic, reasoning behind medication titration, importance of medication adherence, and patient engagement. .   At last visit with MD ***. Symptomatically, he is feeling ***, *** dizziness, lightheadedness, and fatigue. *** chest pain or palpitations. Feels SOB when ***. Able to complete all ADLs. Activity level ***. He *** checks his weight at home (normal range *** - *** lbs). *** LEE, PND, or orthopnea. Appetite has been ***. He *** adheres to a low-salt diet.    Current CHF meds: carvedilol  37.5 mg, Farxiga  10 mg daily, furosemide  40 mg twice daily, spironolactone  12.5 mg daily Previously tried: none? Labs (05/2024): Scr 1.14, K 3.8, Crcl 127 ml/min  Adherence Assessment  Do you ever forget to take your medication? [] Yes [] No  Do you ever skip doses due to side effects? [] Yes [] No  Do you have trouble affording your medicines? [] Yes [] No  Are you ever unable to pick up your medication due to transportation difficulties? [] Yes [] No  Do you ever stop taking your medications because you don't believe they are helping? [] Yes [] No  Do you check your weight daily? [] Yes [] No   Adherence strategy: ***  Barriers to obtaining medications: ***  BP goal: < 130/80  Family History:  Relation Problem  Comments  Mother Metallurgist) Hypertension     Father (Deceased) Kidney failure     Sister Metallurgist) Hypertension     Sister Metallurgist)   Brother (Alive)   Maternal Uncle (Alive) Hypertension   Prostate cancer     Neg Hx CAD None at early ages  Stroke None at early ages       Social History:  Alcohol: Smoking:  Diet: <2300 mg sodium Breakfast: Lunch/Dinner: Snacks: Beverages:  Exercise:    Home BP readings:       Wt Readings from Last 3 Encounters:  06/12/24 (!) 383 lb 12.8 oz (174.1 kg)  06/11/24 (!) 386 lb (175.1 kg)  05/21/24 (!) 389 lb 9.6 oz (176.7 kg)   BP Readings from Last 3 Encounters:  06/12/24 (!) 138/96  06/11/24 (!) 142/86  05/21/24 128/84   Pulse Readings from Last 3 Encounters:  06/12/24 78  06/11/24 93  05/21/24 70    Renal function: CrCl cannot be calculated (Unknown ideal weight.).  Past Medical History:  Diagnosis Date   A-fib (HCC)    Abnormal LFTs 07/06/2012   Cellulitis and abscess of trunk 07/06/2012   Congestive heart failure (CHF) (HCC)    Diarrhea 07/06/2012   Encephalopathy acute 07/06/2012   Hypertension    Hyponatremia 07/06/2012   Nausea & vomiting 07/06/2012   Staph aureus infection 07/06/2012   TIA (transient ischemic attack)    TIA (transient ischemic attack) 04/02/2013  Current Outpatient Medications on File Prior to Visit  Medication Sig Dispense Refill   amiodarone  (PACERONE ) 200 MG tablet Take 1 tablet (200 mg total) by mouth daily. 60 tablet 3   apixaban  (ELIQUIS ) 5 MG TABS tablet Take 1 tablet (5 mg total) by mouth 2 (two) times daily. 60 tablet 3   budesonide -formoterol  (SYMBICORT ) 160-4.5 MCG/ACT inhaler Inhale 2 puffs into the lungs 2 (two) times daily as needed (Shortness of breath).     carvedilol  (COREG ) 12.5 MG tablet Take 1 tablet (12.5 mg total) by mouth 2 (two) times daily. (Patient not taking: Reported on 06/12/2024) 180 tablet 0   carvedilol  (COREG ) 25 MG tablet Take 1 tablet (25 mg total) by  mouth 2 (two) times daily. 180 tablet 3   dapagliflozin  propanediol (FARXIGA ) 10 MG TABS tablet Take 1 tablet (10 mg total) by mouth daily before breakfast. 90 tablet 3   furosemide  (LASIX ) 40 MG tablet Take 1 tablet (40 mg total) by mouth 2 (two) times daily. 60 tablet 3   levalbuterol  (XOPENEX  HFA) 45 MCG/ACT inhaler Inhale 2 puffs into the lungs every 6 (six) hours as needed for wheezing. 45 g 0   rosuvastatin (CRESTOR) 20 MG tablet Take 1 tablet (20 mg total) by mouth daily. 90 tablet 3   spironolactone  (ALDACTONE ) 25 MG tablet Take 1/2 tablet (12.5 mg total) by mouth daily. 15 tablet 3   No current facility-administered medications on file prior to visit.    No Known Allergies   Assessment/Plan:  1. CHF -  There are no diagnoses linked to this encounter.     Thank you   Engelbert Sevin E. Kota Ciancio, Pharm.D [facility]. [facility] [address], [place], [place] [address] Phone: [phone]; Fax: [phone]

## 2024-07-09 ENCOUNTER — Other Ambulatory Visit (HOSPITAL_COMMUNITY): Payer: Self-pay

## 2024-07-10 ENCOUNTER — Other Ambulatory Visit (HOSPITAL_COMMUNITY): Payer: Self-pay

## 2024-07-10 ENCOUNTER — Other Ambulatory Visit (HOSPITAL_COMMUNITY): Payer: Self-pay | Admitting: Physician Assistant

## 2024-07-10 ENCOUNTER — Other Ambulatory Visit: Payer: Self-pay

## 2024-07-10 MED ORDER — SPIRONOLACTONE 25 MG PO TABS
12.5000 mg | ORAL_TABLET | Freq: Every day | ORAL | 3 refills | Status: DC
  Filled 2024-07-10: qty 15, 30d supply, fill #0
  Filled 2024-08-10: qty 15, 30d supply, fill #1
  Filled 2024-09-11: qty 15, 30d supply, fill #2

## 2024-07-10 MED ORDER — FUROSEMIDE 40 MG PO TABS
40.0000 mg | ORAL_TABLET | Freq: Two times a day (BID) | ORAL | 3 refills | Status: AC
  Filled 2024-07-10: qty 60, 30d supply, fill #0
  Filled 2024-08-10: qty 60, 30d supply, fill #1
  Filled 2024-09-11: qty 60, 30d supply, fill #2

## 2024-07-10 MED ORDER — APIXABAN 5 MG PO TABS
5.0000 mg | ORAL_TABLET | Freq: Two times a day (BID) | ORAL | 3 refills | Status: AC
  Filled 2024-07-10: qty 60, 30d supply, fill #0
  Filled 2024-08-10: qty 60, 30d supply, fill #1
  Filled 2024-09-11: qty 60, 30d supply, fill #2

## 2024-07-23 ENCOUNTER — Ambulatory Visit (HOSPITAL_COMMUNITY)
Admission: RE | Admit: 2024-07-23 | Discharge: 2024-07-23 | Disposition: A | Discharge status: Home / Self Care | Source: Ambulatory Visit | Attending: Physician Assistant | Admitting: Physician Assistant

## 2024-07-23 VITALS — BP 140/84 | HR 81 | Ht 72.0 in | Wt 396.0 lb

## 2024-07-23 DIAGNOSIS — D6869 Other thrombophilia: Secondary | ICD-10-CM

## 2024-07-23 DIAGNOSIS — I4819 Other persistent atrial fibrillation: Secondary | ICD-10-CM | POA: Diagnosis not present

## 2024-07-23 DIAGNOSIS — I4891 Unspecified atrial fibrillation: Secondary | ICD-10-CM | POA: Diagnosis not present

## 2024-07-23 NOTE — Progress Notes (Signed)
 Primary Care Physician: Tanda Bleacher, MD Referring Physician: [facility] f/u  Cardiologist: Dr. Pietro  Primary EP: Dr Inocencio Anthony Delgado is a 48 y.o. male with a h/o  morbid obesity, HTN, ran out of his medication about 3 months ago due to PCP leaving and closing his practice and patient having difficulty establishing care. Presented to [facility] ED with worsening bilateral lower extremity edema, dyspnea, chest tightness.  He was found to be in A-fib with RVR, significantly fluid overloaded and was admitted to the hospital.  He was placed on diltiazem  infusion, Eliquis  and cardiology was consulted. His weight is usually in the 330s, it was as high as 357 on admission.    He was found to have HFrEF exacerbation and new onset afib with RVR.  He had  TEE cardioversion, but remained in atrial fibrillation for just a few beats with each shock. TEE showed severely reduced EF at 30% with moderate to severe tricuspid regurgitation, rt atrium severely dilated, mild MR.  He was  improved after diuresis and rate control.  Plan was  for outpatient follow up with cardiology for further management.  He is being seen in afib clinic today. He remains in rate controlled afib. He reports feeling much better, not aware of afib at this point. . Weight is stable, no unusual shortness of breath. He noted symptoms for about one month prior to presenting to the ED. He has been told that he snores teribly with apnea noted. He was put on cpap in hospital. He does not smoke, sue drugs or drink alcohol. 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   He is being compliant with meds. No missed eliquis  5 mg bid, he has been on drug for around a week started 05/29/22 prior to TEE which was negative for thrombus.  F/u in afib clinic, 06/20/22. He is here for Tikosyn  admit. He remains in rate controlled afib. Normovolemic. No missed anticoagulation or benadryl  use. His needs  were reviewed by PharmD and no contraindicated drugs on board. Qtc  is acceptable for tikosyn  use.  F/u in afib clinic, 08/29/21,  one week after Tikosyn  load. He did return to SR with cardioversion but ERAF. The plan was if he still had afib on return to clinic, to cardiovert again. Despite being in afib, he feels great. No missed anticoagulation.   Follow up 02/08/24. Patient returns for follow up for atrial fibrillation. Patient is s/p DCCV 01/08/24 but reverted back to afib after ~2-3 minutes. He does not have tachypalpitations but does note increased lower extremity edema when in afib. His edema responds well to Lasix . No bleeding issues on anticoagulation.   Follow up 03/03/24. Patient returns for follow up for atrial fibrillation and amiodarone  monitoring. He is s/p DCCV on 03/12/24 which was initially successful but he has had early return of afib. His SOB and lower extremity edema did improve while in SR. No bleeding issues on anticoagulation.   Follow up 05/21/24. Patient returns for follow up for atrial fibrillation and amiodarone  monitoring. He is s/p afib and flutter ablation with Dr Inocencio on 04/22/24. Patient reports he is feeling well post ablation with more energy. However, he continues to have significant issues with lower extremity edema. No orthopnea or PND.   Follow up 07/23/24. Patient returns for follow up for atrial fibrillation. He remains in SR today and feels well. No interim symptoms of afib. No bleeding  issues on anticoagulation. He continues to have issues with lower extremity edema.   Today, he  denies symptoms of palpitations, chest pain, shortness of breath, orthopnea, PND, dizziness, presyncope, syncope, snoring, daytime somnolence, bleeding, or neurologic sequela. The patient is tolerating medications without difficulties and is otherwise without complaint today.    Past Medical History:  Diagnosis Date   A-fib (HCC)    Abnormal LFTs 07/06/2012   Cellulitis and abscess  of trunk 07/06/2012   Congestive heart failure (CHF) (HCC)    Diarrhea 07/06/2012   Encephalopathy acute 07/06/2012   Hypertension    Hyponatremia 07/06/2012   Nausea & vomiting 07/06/2012   Staph aureus infection 07/06/2012   TIA (transient ischemic attack)    TIA (transient ischemic attack) 04/02/2013    Current Outpatient Medications  Medication Sig Dispense Refill   apixaban  (ELIQUIS ) 5 MG TABS tablet Take 1 tablet (5 mg total) by mouth 2 (two) times daily. 60 tablet 3   budesonide -formoterol  (SYMBICORT ) 160-4.5 MCG/ACT inhaler Inhale 2 puffs into the lungs 2 (two) times daily as needed (Shortness of breath).     carvedilol  (COREG ) 25 MG tablet Take 1 tablet (25 mg total) by mouth 2 (two) times daily. 180 tablet 3   dapagliflozin  propanediol (FARXIGA ) 10 MG TABS tablet Take 1 tablet (10 mg total) by mouth daily before breakfast. 90 tablet 3   furosemide  (LASIX ) 40 MG tablet Take 1 tablet (40 mg total) by mouth 2 (two) times daily. 60 tablet 3   levalbuterol  (XOPENEX  HFA) 45 MCG/ACT inhaler Inhale 2 puffs into the lungs every 6 (six) hours as needed for wheezing. 45 g 0   rosuvastatin  (CRESTOR ) 20 MG tablet Take 1 tablet (20 mg total) by mouth daily. 90 tablet 3   spironolactone  (ALDACTONE ) 25 MG tablet Take 1/2 tablet (12.5 mg total) by mouth daily. 15 tablet 3   carvedilol  (COREG ) 12.5 MG tablet Take 1 tablet (12.5 mg total) by mouth 2 (two) times daily. (Patient not taking: Reported on 07/23/2024) 180 tablet 0   No current facility-administered medications for this encounter.    ROS- All systems are reviewed and negative except as per the HPI above  Physical Exam: Vitals:   07/23/24 0827  BP: (!) 140/84  Pulse: 81  Weight: (!) 179.6 kg  Height: 6' (1.829 m)     Wt Readings from Last 3 Encounters:  07/23/24 (!) 179.6 kg  06/12/24 (!) 174.1 kg  06/11/24 (!) 175.1 kg    GEN: Well nourished, well developed in no acute distress CARDIAC: Regular rate and rhythm, no  murmurs, rubs, gallops RESPIRATORY:  Clear to auscultation without rales, wheezing or rhonchi  ABDOMEN: Soft, non-tender, non-distended EXTREMITIES:  bilateral 1+ lower extremity edema, chronic venous stasis changes, No deformity    EKG Interpretation Date/Time:  Wednesday July 23 2024 08:33:56 EST Ventricular Rate:  81 PR Interval:  216 QRS Duration:  68 QT Interval:  396 QTC Calculation: 460 R Axis:   52  Text Interpretation: Sinus rhythm with 1st degree A-V block Otherwise normal ECG When compared with ECG of 21-May-2024 08:41, No significant change was found Confirmed by Nevaan Bunton ([id number]) on 07/23/2024 8:53:20 AM    Echo 07/23/23  1. Left ventricular ejection fraction, by estimation, is 60 to 65%. The  left ventricle has normal function. The left ventricle has no regional  wall motion abnormalities. Left ventricular diastolic parameters were  normal.   2. Right ventricular systolic function is normal. The right ventricular  size is normal.  3. The mitral valve is normal in structure. No evidence of mitral valve  regurgitation. No evidence of mitral stenosis.   4. The aortic valve is tricuspid. There is mild calcification of the  aortic valve. Aortic valve regurgitation is not visualized. Aortic valve  sclerosis is present, with no evidence of aortic valve stenosis.   5. The inferior vena cava is normal in size with greater than 50%  respiratory variability, suggesting right atrial pressure of 3 mmHg.    CHA2DS2-VASc Score = 4  The patient's score is based upon: CHF History: 1 HTN History: 1 Diabetes History: 0 Stroke History: 2 Vascular Disease History: 0 Age Score: 0 Gender Score: 0       ASSESSMENT AND PLAN: Persistent Atrial Fibrillation/atrial flutter (ICD10:  I48.19) The patient's CHA2DS2-VASc score is 4, indicating a 4.8% annual risk of stroke.   S/p afib and flutter ablation 04/22/24 Previously failed to maintain SR on dofetilide .  Patient appears  to be maintaining SR Will stop amiodarone  today.  Continue Eliquis  5 mg BID Continue carvedilol  25 mg BID  Secondary Hypercoagulable State (ICD10:  D68.69) The patient is at significant risk for stroke/thromboembolism based upon his CHA2DS2-VASc Score of 4.  Continue Apixaban  (Eliquis ). No bleeding issues.   HFrecEF EF 60-65% GDMT per primary cardiology team Fluid status appears stable today   OSA  Encouraged nightly CPAP  Obesity Body mass index is 53.71 kg/m.  Encouraged lifestyle modification   Follow up with Dr Inocencio in 6 months.     Anthony Kicks PA-C Afib Clinic [facility] [address] [place], [place] [address] [phone]

## 2024-08-29 ENCOUNTER — Other Ambulatory Visit (HOSPITAL_COMMUNITY): Payer: Self-pay

## 2024-08-29 ENCOUNTER — Ambulatory Visit: Attending: Cardiology | Admitting: Pharmacist Clinician (PhC)/ Clinical Pharmacy Specialist

## 2024-08-29 ENCOUNTER — Encounter: Payer: Self-pay | Admitting: Pharmacist Clinician (PhC)/ Clinical Pharmacy Specialist

## 2024-08-29 VITALS — BP 156/88 | HR 84

## 2024-08-29 DIAGNOSIS — E782 Mixed hyperlipidemia: Secondary | ICD-10-CM

## 2024-08-29 DIAGNOSIS — I5020 Unspecified systolic (congestive) heart failure: Secondary | ICD-10-CM

## 2024-08-29 MED ORDER — SACUBITRIL-VALSARTAN 24-26 MG PO TABS
1.0000 | ORAL_TABLET | Freq: Two times a day (BID) | ORAL | 1 refills | Status: DC
  Filled 2024-08-29: qty 30, 15d supply, fill #0
  Filled 2024-08-29: qty 60, 30d supply, fill #0
  Filled 2024-09-08 – 2024-09-11 (×2): qty 30, 15d supply, fill #1

## 2024-08-29 NOTE — Assessment & Plan Note (Signed)
 Assessment: Patient with not at LDL goal of < 70 Most recent LDL 128 on 06/12/24 Has been compliant with high intensity statin: rosuvastatin  20 mg daily Reviewed October cholesterol readings discussed dietary and exercise issues  Plan: Patient will continue with rosuvastatin  20 mg and repeat labs in 2 weeks Follow up with Dr. Michele in 2 weeks

## 2024-08-29 NOTE — Assessment & Plan Note (Signed)
 Assessment: BP in office today is 156/88 Patient with HFrecoveredEF Tolerates current medications without any side effects Denies SOB, palpitation, chest pain, headaches,or edema No concerns with regards to cost of medications, compliance, ability to pick up at pharmacy Reiterated the importance of regular exercise and low salt diet  Plan: GDMT ACEI/ARB/ARNI Entresto  24/26 Start today  Beta blocker Carvedilol  25 mg bid Started in 2023  MRA Spironolactone  12.5 mg daily Started in 2023  SGLT2 Dapagliflozin  10 mg daily Started in 2023   Labs orderd:  BMET in 2 weeks - has CMET and Lipid labs ordered for January by MD Follow up with Dr. Michele on January 15

## 2024-08-29 NOTE — Progress Notes (Signed)
 "  Office Visit    Patient Name: Anthony Delgado Date of Encounter: 08/29/2024  Primary Care Provider:  Tanda Bleacher, MD Primary Cardiologist:  Madonna Large, DO  Chief Complaint    Hyperlipidemia, GDMT  Significant Past Medical History   TIA 2014  HFrecEF Likely precipitated by AF w/RVR, Stage C, NYHA II/III  AF DCCV, ablation -CHADS2-VASc = 4, on Eliquis   preDM 10/25 A1c 6.2     Allergies[1]  History of Present Illness    Anthony Delgado is a 49 y.o. male patient of Dr Large, in the office today to discuss cholesterol management and GDMT.  He currently takes rosuvastatin  20 mg daily, which was started in October.  He is tolerating this well, with no myalgias, and due for follow up labs in the next few weeks.  His triglycerides were also elevated, but will wait to see improvement before considering options for this.  Currently his EF is good, at 60-65%, but a year earlier had been down to 35-40%.    Insurance Carrier: [employer]  Pharmacy:  [facility] [address]     LDL Cholesterol goal:  LDL < 70  Current Medications:   rosuvastatin  20 mg daily   Carvedilol  25 mg bid, dapagliflozin  10 mg daily, spironolactone  12.5 mg daily  Family Hx:  neither parent with heart disease - both living;  3 siblings, 1 sister with DM; 4 kids  all healthy (aged 55-24)   Social Hx: Tobacco: no Alcohol: no Caffeine: green tea, sweet tea  Diet:  mostly home cooked meals; variety of protein, big on fish and chicken/turkey; vegetables fresh; does snack on wheat thins, chips, peanuts  Exercise: swelling in feet, limits walking to 7-8 minutes at a spell   Accessory Clinical Findings   Lab Results  Component Value Date   CHOL 203 (H) 06/12/2024   HDL 34 (L) 06/12/2024   LDLCALC 128 (H) 06/12/2024   TRIG 229 (H) 06/12/2024   CHOLHDL 6.0 (H) 06/12/2024    No results found for: LIPOA  Lab Results  Component Value Date   ALT 34 06/12/2024   AST 44 (H) 06/12/2024   ALKPHOS 82 06/12/2024    BILITOT 0.3 06/12/2024   Lab Results  Component Value Date   CREATININE 1.14 06/12/2024   BUN 11 06/12/2024   NA 139 06/12/2024   K 3.8 06/12/2024   CL 102 06/12/2024   CO2 21 06/12/2024   Lab Results  Component Value Date   HGBA1C 6.2 (H) 06/12/2024    Home Medications    Current Outpatient Medications  Medication Sig Dispense Refill   sacubitril -valsartan  (ENTRESTO ) 24-26 MG Take 1 tablet by mouth 2 (two) times daily. 180 tablet 1   apixaban  (ELIQUIS ) 5 MG TABS tablet Take 1 tablet (5 mg total) by mouth 2 (two) times daily. 60 tablet 3   budesonide -formoterol  (SYMBICORT ) 160-4.5 MCG/ACT inhaler Inhale 2 puffs into the lungs 2 (two) times daily as needed (Shortness of breath).     carvedilol  (COREG ) 25 MG tablet Take 1 tablet (25 mg total) by mouth 2 (two) times daily. 180 tablet 3   dapagliflozin  propanediol (FARXIGA ) 10 MG TABS tablet Take 1 tablet (10 mg total) by mouth daily before breakfast. 90 tablet 3   furosemide  (LASIX ) 40 MG tablet Take 1 tablet (40 mg total) by mouth 2 (two) times daily. 60 tablet 3   levalbuterol  (XOPENEX  HFA) 45 MCG/ACT inhaler Inhale 2 puffs into the lungs every 6 (six) hours as needed for wheezing. 45 g  0   rosuvastatin  (CRESTOR ) 20 MG tablet Take 1 tablet (20 mg total) by mouth daily. 90 tablet 3   spironolactone  (ALDACTONE ) 25 MG tablet Take 1/2 tablet (12.5 mg total) by mouth daily. 15 tablet 3   No current facility-administered medications for this visit.     Assessment & Plan    Mixed hyperlipidemia Assessment: Patient with not at LDL goal of < 70 Most recent LDL 128 on 06/12/24 Has been compliant with high intensity statin: rosuvastatin  20 mg daily Reviewed October cholesterol readings discussed dietary and exercise issues  Plan: Patient will continue with rosuvastatin  20 mg and repeat labs in 2 weeks Follow up with Dr. Michele in 2 weeks   HFrEF (heart failure with reduced ejection fraction) (HCC) Assessment: BP in office today  is 156/88 Patient with HFrecoveredEF Tolerates current medications without any side effects Denies SOB, palpitation, chest pain, headaches,or edema No concerns with regards to cost of medications, compliance, ability to pick up at pharmacy Reiterated the importance of regular exercise and low salt diet  Plan: GDMT ACEI/ARB/ARNI Entresto  24/26 Start today  Beta blocker Carvedilol  25 mg bid Started in 2023  MRA Spironolactone  12.5 mg daily Started in 2023  SGLT2 Dapagliflozin  10 mg daily Started in 2023   Labs orderd:  BMET in 2 weeks - has CMET and Lipid labs ordered for January by MD Follow up with Dr. Michele on January 15    Allean Mink, PharmD CPP [facility] [address]   [place], [place] [address] [phone]  08/29/2024, 11:09 AM       [1] No Known Allergies  "

## 2024-08-29 NOTE — Patient Instructions (Signed)
 Follow up appointment: Anthony Delgado on January 15  Go to the lab on January 15 to check kidney function, potassium and cholesterol  Take your meds as follows:  start valsartan /sacubitril  24/26 mg twice daily  Check your blood pressure at home 3-4 times per week if able and keep record of the readings.  We recommend [employer] Home BP devices  (cuff size is about 18)  Your blood pressure goal is < 120/80  To check your pressure at home you will need to:  1. Sit up in a chair, with feet flat on the floor and back supported. Do not cross your ankles or legs. 2. Rest your left arm so that the cuff is about heart level. If the cuff goes on your upper arm,  then just relax the arm on the table, arm of the chair or your lap. If you have a wrist cuff, we  suggest relaxing your wrist against your chest (think of it as Pledging the Flag with the  wrong arm).  3. Place the cuff snugly around your arm, about 1 inch above the crook of your elbow. The  cords should be inside the groove of your elbow.  4. Sit quietly, with the cuff in place, for about 5 minutes. After that 5 minutes press the power  button to start a reading. 5. Do not talk or move while the reading is taking place.  6. Record your readings on a sheet of paper. Although most cuffs have a memory, it is often  easier to see a pattern developing when the numbers are all in front of you.  7. You can repeat the reading after 1-3 minutes if it is recommended  Make sure your bladder is empty and you have not had caffeine or tobacco within the last 30 min  Always bring your blood pressure log with you to your appointments. If you have not brought your monitor in to be double checked for accuracy, please bring it to your next appointment.  You can find a list of quality blood pressure cuffs at [email or web address]  Important lifestyle changes to control high blood pressure  Intervention  Effect on the BP  Lose extra pounds and watch your  waistline Weight loss is one of the most effective lifestyle changes for controlling blood pressure. If you're overweight or obese, losing even a small amount of weight can help reduce blood pressure. Blood pressure might go down by about 1 millimeter of mercury (mm Hg) with each kilogram (about 2.2 pounds) of weight lost.  Exercise regularly As a general goal, aim for at least 30 minutes of moderate physical activity every day. Regular physical activity can lower high blood pressure by about 5 to 8 mm Hg.  Eat a healthy diet Eating a diet rich in whole grains, fruits, vegetables, and low-fat dairy products and low in saturated fat and cholesterol. A healthy diet can lower high blood pressure by up to 11 mm Hg.  Reduce salt (sodium) in your diet Even a small reduction of sodium in the diet can improve heart health and reduce high blood pressure by about 5 to 6 mm Hg.  Limit alcohol One drink equals 12 ounces of beer, 5 ounces of wine, or 1.5 ounces of 80-proof liquor.  Limiting alcohol to less than one drink a day for women or two drinks a day for men can help lower blood pressure by about 4 mm Hg.   If you have any questions or concerns please use My  Chart to send questions or call the office at [phone]

## 2024-09-08 ENCOUNTER — Other Ambulatory Visit: Payer: Self-pay

## 2024-09-08 ENCOUNTER — Other Ambulatory Visit (HOSPITAL_COMMUNITY): Payer: Self-pay

## 2024-09-11 ENCOUNTER — Other Ambulatory Visit (HOSPITAL_COMMUNITY): Payer: Self-pay

## 2024-09-11 ENCOUNTER — Encounter: Payer: Self-pay | Admitting: Cardiology

## 2024-09-11 ENCOUNTER — Ambulatory Visit: Attending: Cardiology | Admitting: Cardiology

## 2024-09-11 VITALS — BP 150/82 | HR 82 | Resp 16 | Ht 72.0 in | Wt 383.4 lb

## 2024-09-11 DIAGNOSIS — I5020 Unspecified systolic (congestive) heart failure: Secondary | ICD-10-CM | POA: Diagnosis not present

## 2024-09-11 DIAGNOSIS — E782 Mixed hyperlipidemia: Secondary | ICD-10-CM

## 2024-09-11 DIAGNOSIS — Z8673 Personal history of transient ischemic attack (TIA), and cerebral infarction without residual deficits: Secondary | ICD-10-CM | POA: Diagnosis not present

## 2024-09-11 DIAGNOSIS — I4819 Other persistent atrial fibrillation: Secondary | ICD-10-CM

## 2024-09-11 DIAGNOSIS — D6869 Other thrombophilia: Secondary | ICD-10-CM | POA: Diagnosis not present

## 2024-09-11 MED ORDER — SPIRONOLACTONE 25 MG PO TABS
25.0000 mg | ORAL_TABLET | Freq: Every day | ORAL | 3 refills | Status: AC
  Filled 2024-09-11: qty 90, 90d supply, fill #0
  Filled 2024-09-12: qty 30, 30d supply, fill #0

## 2024-09-11 MED ORDER — SACUBITRIL-VALSARTAN 49-51 MG PO TABS
1.0000 | ORAL_TABLET | Freq: Two times a day (BID) | ORAL | 11 refills | Status: AC
  Filled 2024-09-11: qty 60, 30d supply, fill #0

## 2024-09-11 NOTE — Progress Notes (Signed)
 " Cardiology Office Note:  .   Date:  09/11/2024  ID:  Madeleine Gearing, DOB 11-22-1975, MRN [id number] PCP:  Tanda Bleacher, MD  Former Cardiology Providers: Redell Shallow, MD [facility] Providers Cardiologist:  Madonna Large, DO, [facility] (established care 06/11/24) Electrophysiologist:  Will Gladis Norton, MD  Electrophysiologist:  Will Gladis Norton, MD  Click to update primary MD,subspecialty MD or APP then REFRESH:1}    Chief Complaint  Patient presents with   Follow-up    Heart failure    Patient Profile: .   Anthony Delgado is a 49 y.o. African-American male whose past medical history and cardiovascular risk factors includes: Persistent atrial fibrillation/flutter, status post cardioversion and status post atrial fibrillation and flutter ablation, heart failure with improved ejection fraction, tricuspid regurgitation, history of TIA, hyperlipidemia,  History of Present Illness: .    Patient is referred to the practice by A-fib clinic to reestablish care with general cardiology.  Review of prior records illustrated patient has had a long history of persistent atrial fibrillation/flutter and heart failure with reduced EF.  He has undergone cardioversions but failed to maintain sinus rhythm.  Based on EMR patient also failed Tikosyn  and then later was placed on amiodarone .  He underwent atrial fibrillation and flutter ablation with Dr. Norton on 04/22/24.   During his visit in October 2025 patient was working the third shift, complained of lower extremity and scrotal edema, ambulating 10 minutes, and was diagnosed with sleep apnea and was awaiting device.  Started him on Farxiga  10 mg p.o. daily and referred him to Pharm.D. clinic for further medication titration.  With regards to his history of atrial fibrillation status post ablation he remains on carvedilol  for rate control strategy and Eliquis  for thromboembolic prophylaxis.  With regards to his lipid management started him on  Crestor  20 mg p.o. nightly given his history of TIA, 10-year risk of ASCVD at that time being 12.3% and index LDL 112 mg/dL.  Patient presents today for follow-up.  He has ongoing fluid retention in his legs and scrotum overall improving but not resolved. Weight is down trending since last visit. He takes Lasix  twice daily, Farxiga  10 mg in the morning, carvedilol  25 mg twice daily, and started Entresto  about two weeks ago with our PharmD clinic. He uses compression stockings mainly on weekends. He follows a low salt diet.   He was not fasting for his October labs. He eats out two to three times every two weeks. His carb intake is mostly pasta, and does not drink or smoke.  He has his new sleep device and uses it daily.   Review of Systems: .   Review of Systems  Cardiovascular:  Positive for dyspnea on exertion (chronic and stable) and leg swelling. Negative for chest pain, claudication, irregular heartbeat, near-syncope, orthopnea, palpitations, paroxysmal nocturnal dyspnea and syncope.       Scrotal swelling  Respiratory:  Negative for shortness of breath.   Hematologic/Lymphatic: Negative for bleeding problem.    Studies Reviewed:   Echocardiogram: 05/30/2022: LVEF 35-40%, global hypokinesis, LAE mildly dilated, RA severely dilated, see report for additional details.  07/23/23  1. Left ventricular ejection fraction, by estimation, is 60 to 65%. The  left ventricle has normal function. The left ventricle has no regional  wall motion abnormalities. Left ventricular diastolic parameters were  normal.   2. Right ventricular systolic function is normal. The right ventricular  size is normal.   3. The mitral valve is normal in structure. No evidence  of mitral valve  regurgitation. No evidence of mitral stenosis.   4. The aortic valve is tricuspid. There is mild calcification of the  aortic valve. Aortic valve regurgitation is not visualized. Aortic valve  sclerosis is present, with no  evidence of aortic valve stenosis.   5. The inferior vena cava is normal in size with greater than 50%  respiratory variability, suggesting right atrial pressure of 3 mmHg.   RADIOLOGY: NA  Risk Assessment/Calculations:   Click Here to Calculate/Change CHADS2VASc Score The patient's CHADS2-VASc score is 5, indicating a 7.2% annual risk of stroke.   CHF History: Yes HTN History: Yes Diabetes History: No Stroke History: Yes Vascular Disease History: Yes  The 10-year ASCVD risk score (Arnett DK, et al., 2019) is: 13.7%   Values used to calculate the score:     Age: 68 years     Clinically relevant sex: Male     Is Non-Hispanic African American: Yes     Diabetic: No     Tobacco smoker: No     Systolic Blood Pressure: 150 mmHg     Is BP treated: Yes     HDL Cholesterol: 34 mg/dL     Total Cholesterol: 203 mg/dL  Labs:       Latest Ref Rng & Units 06/12/2024    9:30 AM 04/17/2024    8:59 AM 03/11/2024   10:26 AM  CBC  WBC 3.4 - 10.8 x10E3/uL 4.5  5.1  5.4   Hemoglobin 13.0 - 17.7 g/dL 83.9  83.7  84.5   Hematocrit 37.5 - 51.0 % 49.1  47.9  46.1   Platelets 150 - 450 x10E3/uL 273  241  215        Latest Ref Rng & Units 06/12/2024    9:30 AM 06/11/2024   10:06 AM 06/11/2024    9:57 AM  BMP  Glucose 70 - 99 mg/dL 94  82  76   BUN 6 - 24 mg/dL 11  12  12    Creatinine 0.76 - 1.27 mg/dL 8.85  8.89  8.77   BUN/Creat Ratio 9 - 20 10  11  10    Sodium 134 - 144 mmol/L 139  140  140   Potassium 3.5 - 5.2 mmol/L 3.8  4.3  4.8   Chloride 96 - 106 mmol/L 102  100  100   CO2 20 - 29 mmol/L 21  22  24    Calcium  8.7 - 10.2 mg/dL 9.1  9.0  9.0       Latest Ref Rng & Units 06/12/2024    9:30 AM 06/11/2024   10:06 AM 06/11/2024    9:57 AM  CMP  Glucose 70 - 99 mg/dL 94  82  76   BUN 6 - 24 mg/dL 11  12  12    Creatinine 0.76 - 1.27 mg/dL 8.85  8.89  8.77   Sodium 134 - 144 mmol/L 139  140  140   Potassium 3.5 - 5.2 mmol/L 3.8  4.3  4.8   Chloride 96 - 106 mmol/L 102  100  100    CO2 20 - 29 mmol/L 21  22  24    Calcium  8.7 - 10.2 mg/dL 9.1  9.0  9.0   Total Protein 6.0 - 8.5 g/dL 8.1   7.5   Total Bilirubin 0.0 - 1.2 mg/dL 0.3   0.3   Alkaline Phos 47 - 123 IU/L 82   76   AST 0 - 40 IU/L 44   45  ALT 0 - 44 IU/L 34   32     Lab Results  Component Value Date   CHOL 203 (H) 06/12/2024   HDL 34 (L) 06/12/2024   LDLCALC 128 (H) 06/12/2024   TRIG 229 (H) 06/12/2024   CHOLHDL 6.0 (H) 06/12/2024   No results for input(s): LIPOA in the last 8760 hours. No components found for: NTPROBNP Recent Labs    06/11/24 0957  PROBNP <36   Recent Labs    02/08/24 1018  TSH 4.410    Physical Exam:    Today's Vitals   09/11/24 0905  BP: (!) 150/82  Pulse: 82  Resp: 16  SpO2: 95%  Weight: (!) 383 lb 6.4 oz (173.9 kg)  Height: 6' (1.829 m)   Body mass index is 52 kg/m. Wt Readings from Last 3 Encounters:  09/11/24 (!) 383 lb 6.4 oz (173.9 kg)  07/23/24 (!) 396 lb (179.6 kg)  06/12/24 (!) 383 lb 12.8 oz (174.1 kg)    Physical Exam  Constitutional: No distress.  hemodynamically stable  Neck: No JVD present.  Cardiovascular: Normal rate, regular rhythm, S1 normal and S2 normal. Exam reveals no gallop, no S3 and no S4.  No murmur heard. Pulmonary/Chest: Effort normal and breath sounds normal. No stridor. He has no wheezes. He has no rales.  Musculoskeletal:        General: Edema present.     Cervical back: Neck supple.  Skin: Skin is warm.   Impression & Recommendation(s):  Impression:   ICD-10-CM   1. Medication management  Z79.899 Comp Met (CMET)    Lipid Profile    B Nat Peptide    B Nat Peptide    Lipid Profile    Comp Met (CMET)        Recommendation(s):  Heart failure with recovered ejection fraction (HFrecEF) (HCC) Stage C, NYHA class II/III. Has lost approximately 3 pounds since his last office visit in October 2025 Remains volume up on physical examination Likely precipitated by A-fib with RVR Most recent echocardiogram notes  preserved LVEF. Continue carvedilol  25 mg p.o. twice daily. Continue Farxiga  10 mg p.o. daily. Continue Lasix  40 mg p.o. twice daily. Continue/increase Entresto  from 24/26 mg twice daily to 49/51 mg p.o. twice daily Continue/increase spironolactone  from 12.5 mg p.o. daily to 25 mg p.o. daily Follow-up labs in 1 week to evaluate renal function Continue following up with Pharm.D. for uptitration of GDMT and better blood pressure management  Reemphasized the importance of compression stockings for at least 8 hours a day. Reemphasize importance of low-salt diet  Encouraged him to continue compliance with his device therapy for sleep apnea  Persistent atrial fibrillation (HCC) Rate control: Carvedilol  Rhythm control: None Thromboembolic prophylaxis: Eliquis . Status post cardioversion. Status post atrial fibrillation and flutter ablation with Dr. Inocencio 04/22/2024 Previously failed to maintain sinus rhythm on Tikosyn . During his last A-fib clinic appointment in November 2025 amiodarone  was discontinued as he maintained sinus rhythm Alcohol consumption: None Reemphasize management of his sleep apnea, follows with [facility]  Secondary hypercoagulable state Indication persistent atrial fibrillation. High CHA2DS2-VASc 4. Currently on Eliquis . Does not endorse evidence of bleeding. Risks, benefits, and alternatives to anticoagulation discussed  Hx-TIA (transient ischemic attack) Reemphasized the importance of secondary prevention with focus on improving the modifiable cardiovascular risk factors such as glycemic control, lipid management, blood pressure control, weight loss.  Mixed hyperlipidemia Index LDL 112 mg/dL, triglycerides 810 mg/dL ASCVD risk 87.6%. History of TIA Not on lipid-lowering agents. Currently on Crestor   20 mg p.o. nightly. His lipids performed in October 2025 or nonfasting.  Will recheck fasting lipids to reevaluate therapy  Orders Placed:  Orders Placed This  Encounter  Procedures   Comp Met (CMET)    Standing Status:   Future    Number of Occurrences:   1    Expected Date:   09/18/2024    Expiration Date:   09/11/2025   Lipid Profile    Standing Status:   Future    Number of Occurrences:   1    Expected Date:   09/18/2024    Expiration Date:   09/11/2025   B Nat Peptide    Standing Status:   Future    Number of Occurrences:   1    Expected Date:   09/18/2024    Expiration Date:   09/11/2025     Final Medication List:    Meds ordered this encounter  Medications   sacubitril -valsartan  (ENTRESTO ) 49-51 MG    Sig: Take 1 tablet by mouth 2 (two) times daily.    Dispense:  60 tablet    Refill:  11   spironolactone  (ALDACTONE ) 25 MG tablet    Sig: Take 1 tablet (25 mg total) by mouth daily.    Dispense:  90 tablet    Refill:  3    Medications Discontinued During This Encounter  Medication Reason   sacubitril -valsartan  (ENTRESTO ) 24-26 MG Dose change   spironolactone  (ALDACTONE ) 25 MG tablet Dose change     Current Outpatient Medications:    apixaban  (ELIQUIS ) 5 MG TABS tablet, Take 1 tablet (5 mg total) by mouth 2 (two) times daily., Disp: 60 tablet, Rfl: 3   budesonide -formoterol  (SYMBICORT ) 160-4.5 MCG/ACT inhaler, Inhale 2 puffs into the lungs 2 (two) times daily as needed (Shortness of breath)., Disp: , Rfl:    carvedilol  (COREG ) 25 MG tablet, Take 1 tablet (25 mg total) by mouth 2 (two) times daily., Disp: 180 tablet, Rfl: 3   dapagliflozin  propanediol (FARXIGA ) 10 MG TABS tablet, Take 1 tablet (10 mg total) by mouth daily before breakfast., Disp: 90 tablet, Rfl: 3   furosemide  (LASIX ) 40 MG tablet, Take 1 tablet (40 mg total) by mouth 2 (two) times daily., Disp: 60 tablet, Rfl: 3   levalbuterol  (XOPENEX  HFA) 45 MCG/ACT inhaler, Inhale 2 puffs into the lungs every 6 (six) hours as needed for wheezing., Disp: 45 g, Rfl: 0   rosuvastatin  (CRESTOR ) 20 MG tablet, Take 1 tablet (20 mg total) by mouth daily., Disp: 90 tablet, Rfl: 3    sacubitril -valsartan  (ENTRESTO ) 49-51 MG, Take 1 tablet by mouth 2 (two) times daily., Disp: 60 tablet, Rfl: 11   spironolactone  (ALDACTONE ) 25 MG tablet, Take 1 tablet (25 mg total) by mouth daily., Disp: 90 tablet, Rfl: 3  Consent:   NA  Disposition:   33-month follow-up sooner if needed  His questions and concerns were addressed to his satisfaction. He voices understanding of the recommendations provided during this encounter.    Signed, Madonna Michele HAS, [facility]  A Division of [facility] [address], [place], [place] [address]  09/11/2024 11:16 AM "

## 2024-09-11 NOTE — Patient Instructions (Signed)
 Medication Instructions:  INCREASE Sacubitril -Valsartan  (Entresto ) to 49-51 mg. Take one (1) tablet by mouth twice daily.  INCREASE Spironolactone  (Aldactone ) to 25 mg. Take one (1) tablet by mouth once daily. *If you need a refill on your cardiac medications before your next appointment, please call your pharmacy*  Lab Work: CMP, Fasting Lipid Panel, BNP in one week If you have labs (blood work) drawn today and your tests are completely normal, you will receive your results only by: MyChart Message (if you have MyChart) OR A paper copy in the mail If you have any lab test that is abnormal or we need to change your treatment, we will call you to review the results.  Testing/Procedures: None ordered  Follow-Up: At [facility], you and your health needs are our priority.  As part of our continuing mission to provide you with exceptional heart care, our providers are all part of one team.  This team includes your primary Cardiologist (physician) and Advanced Practice Providers or APPs (Physician Assistants and Nurse Practitioners) who all work together to provide you with the care you need, when you need it.  Your next appointment:   3 month(s)  Also Schedule follow-up with Pharm-D.  Provider:   Madonna Large, DO    We recommend signing up for the patient portal called MyChart.  Sign up information is provided on this After Visit Summary.  MyChart is used to connect with patients for Virtual Visits (Telemedicine).  Patients are able to view lab/test results, encounter notes, upcoming appointments, etc.  Non-urgent messages can be sent to your provider as well.   To learn more about what you can do with MyChart, go to [email or web address].

## 2024-09-12 ENCOUNTER — Other Ambulatory Visit (HOSPITAL_COMMUNITY): Payer: Self-pay

## 2024-10-02 LAB — COMPREHENSIVE METABOLIC PANEL WITH GFR
ALT: 34 [IU]/L (ref 0–44)
AST: 45 [IU]/L — ABNORMAL HIGH (ref 0–40)
Albumin: 3.9 g/dL (ref 3.8–4.9)
Alkaline Phosphatase: 77 [IU]/L (ref 47–123)
BUN/Creatinine Ratio: 8 — ABNORMAL LOW (ref 9–20)
BUN: 11 mg/dL (ref 6–24)
Bilirubin Total: 0.5 mg/dL (ref 0.0–1.2)
CO2: 21 mmol/L (ref 20–29)
Calcium: 9 mg/dL (ref 8.7–10.2)
Chloride: 103 mmol/L (ref 96–106)
Creatinine, Ser: 1.33 mg/dL — ABNORMAL HIGH (ref 0.76–1.27)
Globulin, Total: 3.5 g/dL (ref 1.5–4.5)
Glucose: 81 mg/dL (ref 70–99)
Potassium: 4.4 mmol/L (ref 3.5–5.2)
Sodium: 141 mmol/L (ref 134–144)
Total Protein: 7.4 g/dL (ref 6.0–8.5)
eGFR: 65 mL/min/{1.73_m2}

## 2024-10-02 LAB — LIPID PANEL
Chol/HDL Ratio: 4.8 ratio (ref 0.0–5.0)
Cholesterol, Total: 139 mg/dL (ref 100–199)
HDL: 29 mg/dL — ABNORMAL LOW
LDL Chol Calc (NIH): 84 mg/dL (ref 0–99)
Triglycerides: 144 mg/dL (ref 0–149)
VLDL Cholesterol Cal: 26 mg/dL (ref 5–40)

## 2024-10-02 LAB — BRAIN NATRIURETIC PEPTIDE: BNP: 12.1 pg/mL (ref 0.0–100.0)

## 2024-10-03 ENCOUNTER — Ambulatory Visit: Payer: Self-pay | Admitting: Physician Assistant

## 2024-10-03 NOTE — Telephone Encounter (Signed)
 Left detailed message per DPR with call back number.

## 2024-10-03 NOTE — Telephone Encounter (Signed)
-----   Message from [place], [place] sent at 10/03/2024  8:43 AM EST ----- Covering for Dr. Michele, significantly improved total cholesterol and bad cholesterol. Good cholesterol is chronically low and mainly increased through increased activity. Chronically borderline high  liver function, but largely unchanged in the past 3 month. BNP normal, suggesting euvolemic state. Kidney function slightly worse, recommend small increase of daily fluid intake, obtain BMET in a  month, if Cr still tick up, may need to cut back on lasix .

## 2024-10-06 ENCOUNTER — Ambulatory Visit

## 2024-12-11 ENCOUNTER — Ambulatory Visit: Admitting: Family Medicine

## 2024-12-12 ENCOUNTER — Ambulatory Visit: Admitting: Cardiology
